# Patient Record
Sex: Female | Born: 1975 | Race: White | Hispanic: No | Marital: Married | Smoking: Never smoker
Health system: Southern US, Community
[De-identification: ages and names within clinical notes are randomized; demographics above are authoritative.]

## PROBLEM LIST (undated history)

## (undated) DIAGNOSIS — M2031 Hallux varus (acquired), right foot: Secondary | ICD-10-CM

## (undated) DIAGNOSIS — F32A Depression, unspecified: Secondary | ICD-10-CM

## (undated) DIAGNOSIS — I10 Essential (primary) hypertension: Secondary | ICD-10-CM

## (undated) DIAGNOSIS — M1990 Unspecified osteoarthritis, unspecified site: Secondary | ICD-10-CM

## (undated) HISTORY — PX: BREAST MASS EXCISION: SHX1267

---

## 2008-12-15 ENCOUNTER — Encounter: Admission: RE | Admit: 2008-12-15 | Discharge: 2008-12-15 | Payer: Self-pay | Admitting: Family Medicine

## 2008-12-15 IMAGING — CT CT MAXILLOFACIAL W/O CM
3 series · 16 of 40 positions shown, 19 images · non-contrast
Comparison: None available.

CLINICAL DATA: Recurrent sinusitis.

CT MAXILLOFACIAL WITHOUT CONTRAST
TECHNIQUE: Multidetector CT imaging of the maxillofacial
structures was performed. Multiplanar CT image reconstructions were
also generated.

[Series 4: 2.5 axial soft · axial · 0.35mm/px · z∈[-551,-523]mm · 3 of 48 slices shown]
[im 6/48  brain]
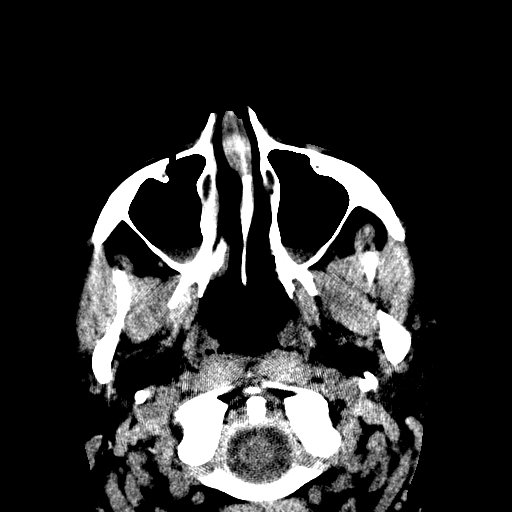
[im 12/48  brain]
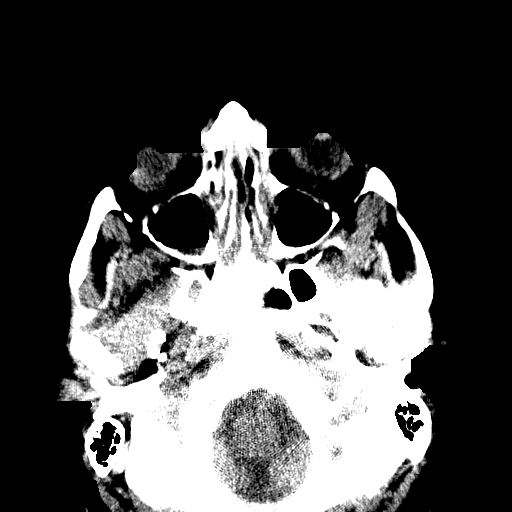
[im 17/48  brain]
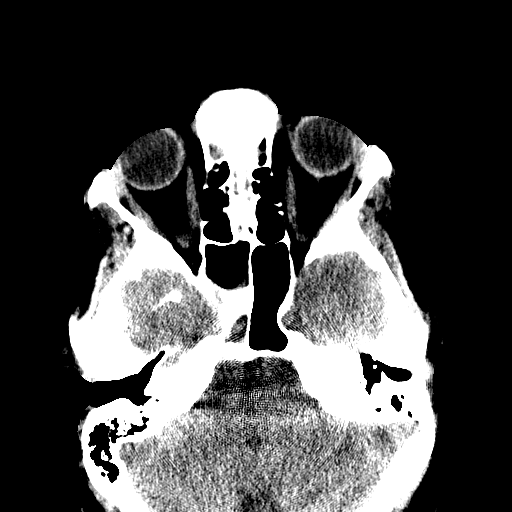

[coronals · coronal · 0.35mm/px · 3 of 38 slices shown]
[im 13/38  bone]
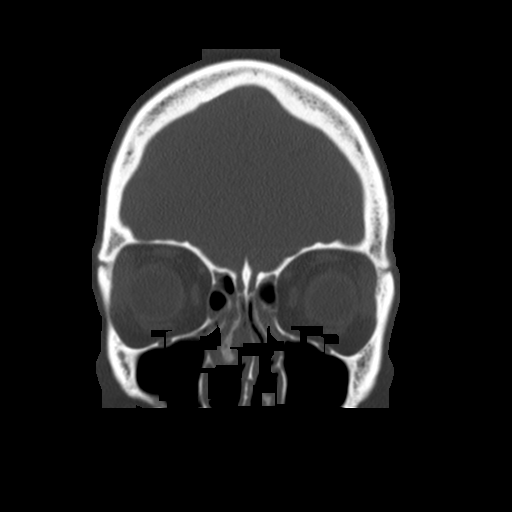
[im 17/38  bone]
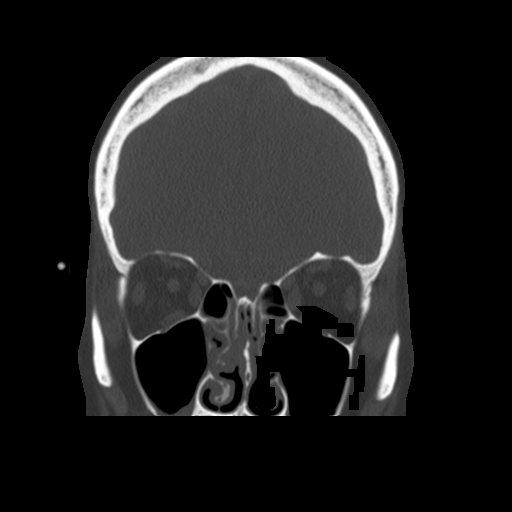
[im 21/38  bone]
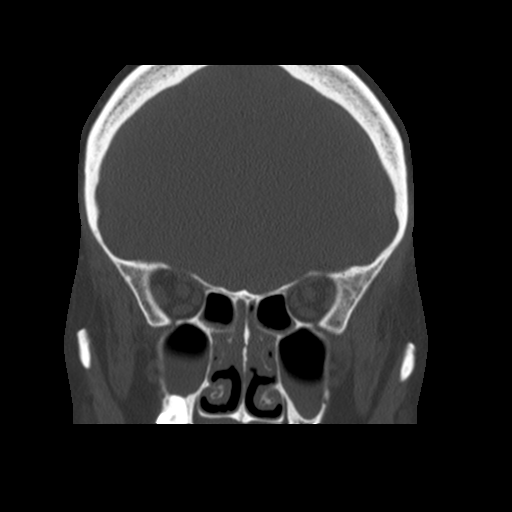

[ang.axials · axial · 0.35mm/px · z∈[-575,-510]mm · 10 of 33 slices shown, 13 images]
[im 3/33  brain]
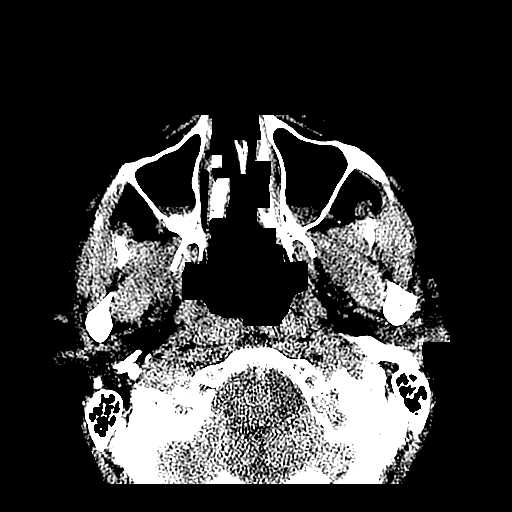
[im 3/33  bone]
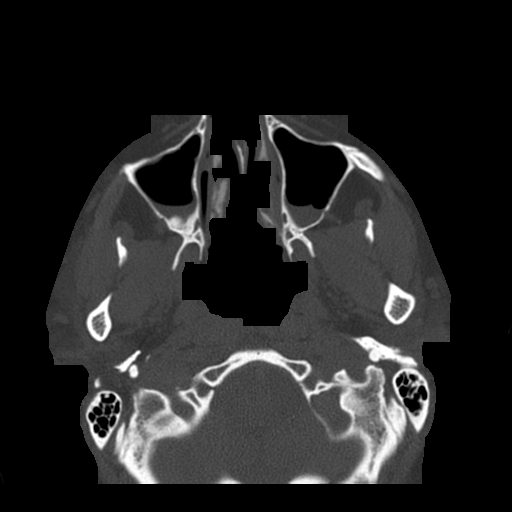
[im 6/33  bone]
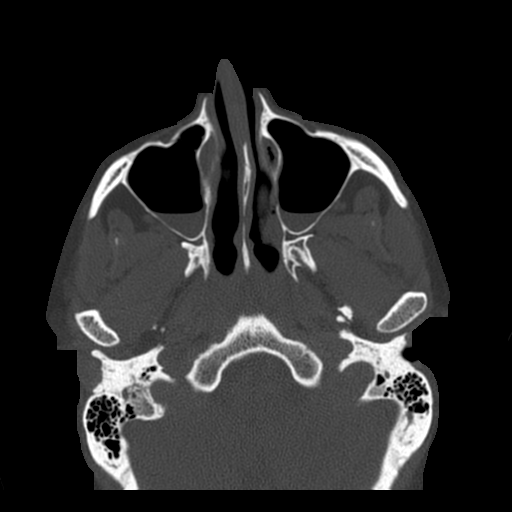
[im 9/33  bone]
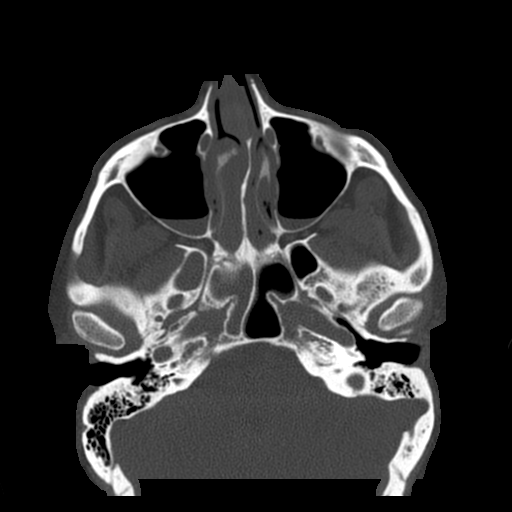
[im 12/33  bone]
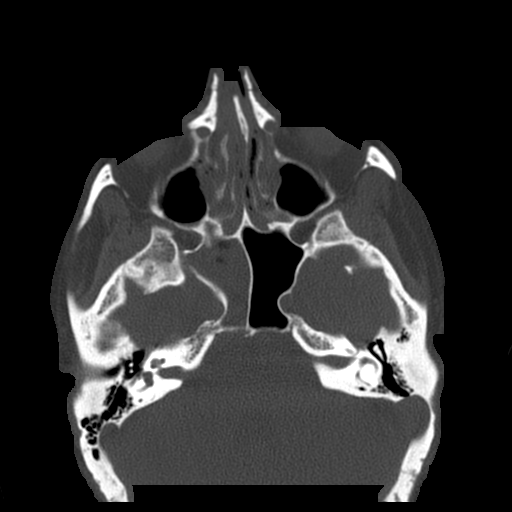
[im 15/33  brain]
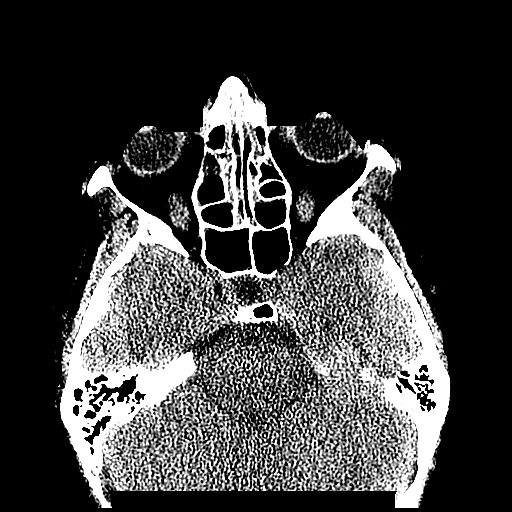
[im 15/33  bone]
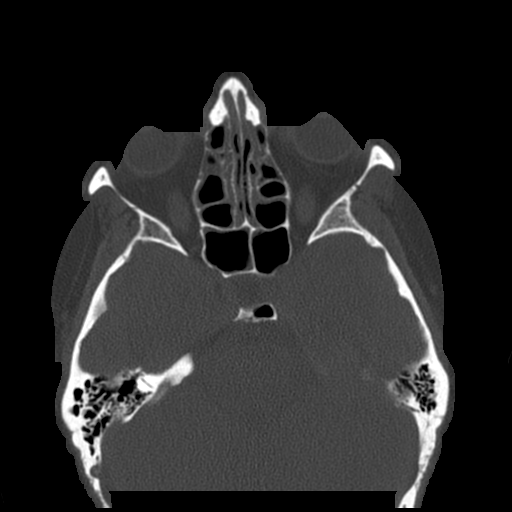
[im 18/33  bone]
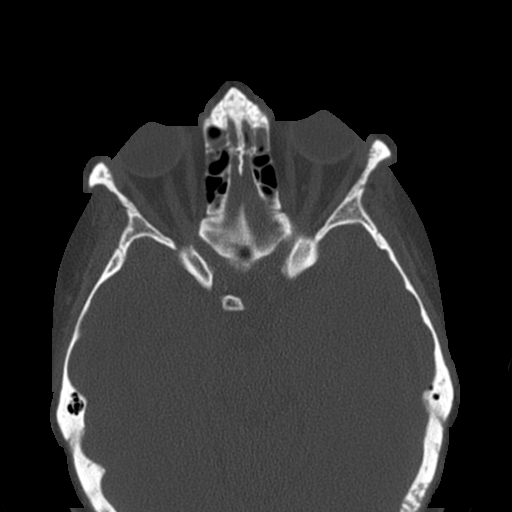
[im 21/33  bone]
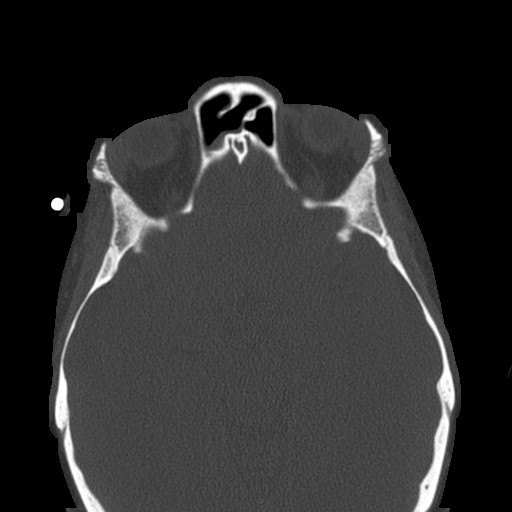
[im 24/33  bone]
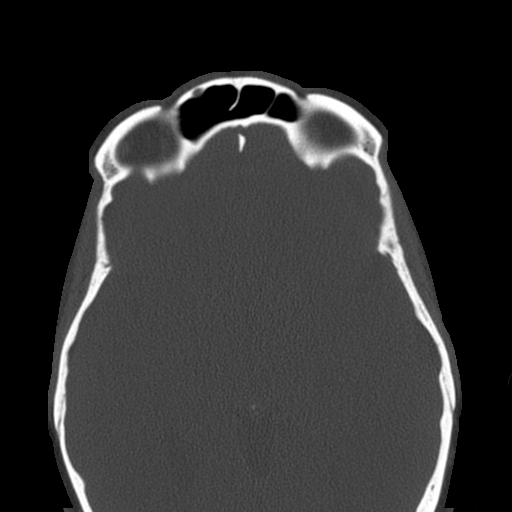
[im 27/33  brain]
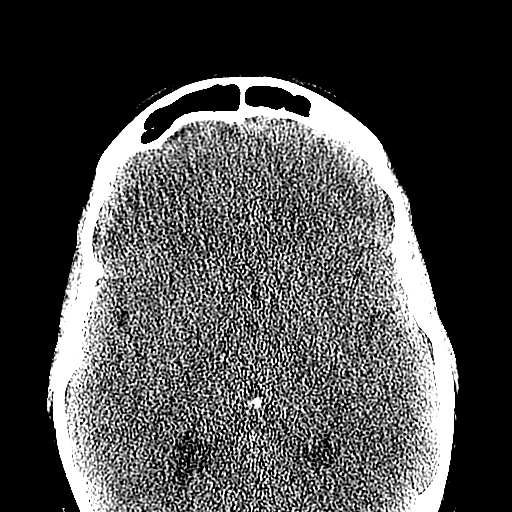
[im 27/33  bone]
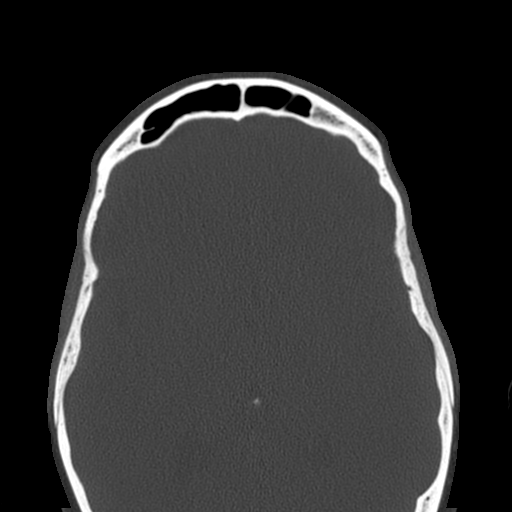
[im 30/33  bone]
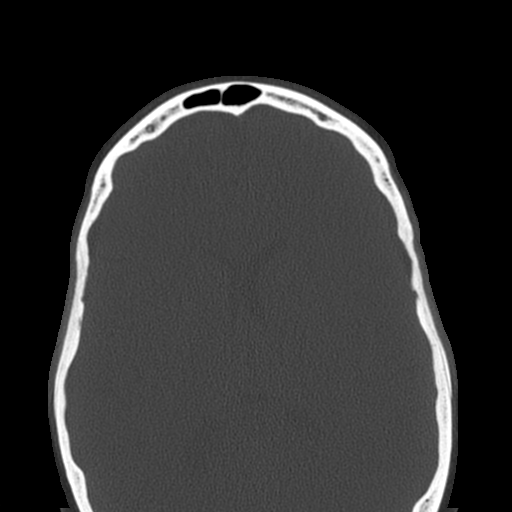

[16 of 40 positions shown; findings below may reference images not displayed]

FINDINGS: Air-fluid levels are present within the maxillary sinuses
bilaterally.  No significant mucosal disease is present.  There is
near total opacification of the right sphenoid sinus with frothy
secretions.  Mucosal thickening is evident throughout the ethmoid
air cells bilaterally at the the frontal sinuses are clear.  The
mastoid air cells are clear.  The osseous skull is intact.  The
soft tissues are unremarkable.  Limited imaging of the brain is
within normal limits.  There is mucosal thickening within the
ostiomeatal complex bilaterally without obstruction.  Slight
leftward nasal septal deviation is evident.
IMPRESSION: 1.  Acute and chronic sinus disease involving the maxillary sinuses
bilaterally, the ethmoid air cells bilaterally, and the right
sphenoid sinus.  No significant bone changes are evident.
2.  Mucosal thickening within the ostiomeatal complex bilaterally
without identification of the obstructing lesion.

## 2010-09-10 ENCOUNTER — Encounter: Payer: Self-pay | Admitting: Family Medicine

## 2019-06-22 ENCOUNTER — Other Ambulatory Visit: Payer: Self-pay

## 2019-06-22 DIAGNOSIS — Z20822 Contact with and (suspected) exposure to covid-19: Secondary | ICD-10-CM

## 2019-06-23 LAB — NOVEL CORONAVIRUS, NAA: SARS-CoV-2, NAA: NOT DETECTED

## 2020-10-19 ENCOUNTER — Telehealth: Payer: Self-pay

## 2020-10-19 ENCOUNTER — Other Ambulatory Visit: Payer: Self-pay

## 2020-10-19 ENCOUNTER — Ambulatory Visit: Payer: BC Managed Care – PPO | Admitting: Orthopedic Surgery

## 2020-10-19 ENCOUNTER — Ambulatory Visit: Payer: Self-pay

## 2020-10-19 ENCOUNTER — Encounter: Payer: Self-pay | Admitting: Orthopedic Surgery

## 2020-10-19 VITALS — Ht 73.0 in | Wt 237.0 lb

## 2020-10-19 DIAGNOSIS — M25561 Pain in right knee: Secondary | ICD-10-CM

## 2020-10-19 DIAGNOSIS — M1711 Unilateral primary osteoarthritis, right knee: Secondary | ICD-10-CM

## 2020-10-19 MED ORDER — BUPIVACAINE HCL 0.25 % IJ SOLN
4.0000 mL | INTRAMUSCULAR | Status: AC | PRN
  Administered 2020-10-19: 4 mL via INTRA_ARTICULAR

## 2020-10-19 MED ORDER — LIDOCAINE HCL 1 % IJ SOLN
5.0000 mL | INTRAMUSCULAR | Status: AC | PRN
  Administered 2020-10-19: 5 mL

## 2020-10-19 MED ORDER — METHYLPREDNISOLONE ACETATE 40 MG/ML IJ SUSP
40.0000 mg | INTRAMUSCULAR | Status: AC | PRN
  Administered 2020-10-19: 40 mg via INTRA_ARTICULAR

## 2020-10-19 NOTE — Telephone Encounter (Signed)
Noted  

## 2020-10-19 NOTE — Telephone Encounter (Signed)
Can we get patient approved for right knee gel injection? Thanks.  

## 2020-10-19 NOTE — Progress Notes (Signed)
Office Visit Note   Patient: Linda Gill           Date of Birth: November 29, 1975           MRN: [id number] Visit Date: 10/19/2020 Requested by: No referring provider defined for this encounter. PCP: Tracey Harries, MD  Subjective: Chief Complaint  Patient presents with  . Right Knee - Pain    HPI: Linda Gill is a 45 year old patient with right knee pain.  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  Like to do running lifting and CrossFit but she has not been able to do that for several years due to recent diagnosis of rheumatoid arthritis.  She has tried several medications but it is not working great to control her joint pain.  Reports multiple joint complaints but her primary issue which is keeping her from being able to obtain a higher level of fitness is her right knee.  Takes Mobic daily which helps the most.  No prior injections.  She works as a Pension scheme manager.  She does feel a little bit of catching laterally.              ROS: All systems reviewed are negative as they relate to the chief complaint within the history of present illness.  Patient denies  fevers or chills.   Assessment & Plan: Visit Diagnoses:  1. Right knee pain, unspecified chronicity     Plan: Impression is right knee synovitis with early arthritis on plain radiographs.  Interested in diminishing pain to achieve a higher level of activity.  Discussed optimal exercise methods.  I think cortisone injection indicated today with return office visit in about 6 weeks to assess efficacy and potentially consider gel injection.  Nonweightbearing quad strengthening exercises encouraged.  Follow-up as needed for gel injection which I think will likely be in around 6 to 8 weeks.  Marland Kitchen.This patient is diagnosed with osteoarthritis of the knee(s).    Radiographs show evidence of joint space narrowing, osteophytes, subchondral sclerosis and/or subchondral cysts.  This patient has knee pain which interferes with  functional and activities of daily living.    This patient has experienced inadequate response, adverse effects and/or intolerance with conservative treatments such as acetaminophen, NSAIDS, topical creams, physical therapy or regular exercise, knee bracing and/or weight loss.   This patient has experienced inadequate response or has a contraindication to intra articular steroid injections for at least 3 months.   This patient is not scheduled to have a total knee replacement within 6 months of starting treatment with viscosupplementation.   Follow-Up Instructions: Return in about 6 weeks (around 11/30/2020).   Orders:  Orders Placed This Encounter  Procedures  . XR KNEE 3 VIEW RIGHT   No orders of the defined types were placed in this encounter.     Procedures: Large Joint Inj: R knee on 10/19/2020 7:44 PM Indications: diagnostic evaluation, joint swelling and pain Details: 18 G 1.5 in needle, superolateral approach  Arthrogram: No  Medications: 5 mL lidocaine 1 %; 40 mg methylPREDNISolone acetate 40 MG/ML; 4 mL bupivacaine 0.25 % Outcome: tolerated well, no immediate complications Procedure, treatment alternatives, risks and benefits explained, specific risks discussed. Consent was given by the patient. Immediately prior to procedure a time out was called to verify the correct patient, procedure, equipment, support staff and site/side marked as required. Patient was prepped and draped in the usual sterile fashion.       Clinical Data: No additional findings.  Objective:  Vital Signs: Ht 6\' 1"  (1.854 m)   Wt 237 lb (107.5 kg)   BMI 31.27 kg/m   Physical Exam:   Constitutional: Patient appears well-developed HEENT:  Head: Normocephalic Eyes:EOM are normal Neck: Normal range of motion Cardiovascular: Normal rate Pulmonary/chest: Effort normal Neurologic: Patient is alert Skin: Skin is warm Psychiatric: Patient has normal mood and affect    Ortho Exam: Ortho exam  demonstrates full active and passive range of motion of the right knee.  Some synovitis and warmth is present right knee versus left.  Collateral and cruciate ligaments are stable.  Extensor mechanism is intact.  No masses lymphadenopathy or skin changes noted in that right knee region.  Range of motion is full.  Pedal pulses palpable.  She has medial and lateral joint line tenderness with significant patellofemoral crepitus more on the right than the left.  Specialty Comments:  No specialty comments available.  Imaging: XR KNEE 3 VIEW RIGHT  Result Date: 10/19/2020 AP lateral merchant right knee reviewed.  Osteophyte formation is present on the medial side and to a lesser degree lateral side.  Patellofemoral joint space narrowing also noted.  Patella is centered within the trochlear groove.  No acute fracture.    PMFS History: There are no problems to display for this patient.  History reviewed. No pertinent past medical history.  History reviewed. No pertinent family history.  History reviewed. No pertinent surgical history. Social History   Occupational History  . Not on file  Tobacco Use  . Smoking status: Not on file  . Smokeless tobacco: Not on file  Substance and Sexual Activity  . Alcohol use: Not on file  . Drug use: Not on file  . Sexual activity: Not on file

## 2020-10-21 ENCOUNTER — Telehealth: Payer: Self-pay

## 2020-10-21 NOTE — Telephone Encounter (Signed)
Submitted VOB for SynviscOne, right knee. Pending BV. 

## 2020-11-16 ENCOUNTER — Telehealth: Payer: Self-pay

## 2020-11-16 NOTE — Telephone Encounter (Signed)
PA required for SynviscOne, right knee. Faxed completed PA form to [employer] at [phone]. 

## 2020-11-21 ENCOUNTER — Telehealth: Payer: Self-pay

## 2020-11-21 NOTE — Telephone Encounter (Signed)
Called and left a Vm advising patient to call back to schedule an appointment for gel injection with Dr. August Saucer.  Approved for SynviscOne, bilateral knee. Buy & Bill Covered at 100% after Co-pay Co-pay of $150.00 is required PA Approval# [id number] Valid 11/16/2020- 05/14/2021

## 2020-12-21 ENCOUNTER — Other Ambulatory Visit: Payer: Self-pay

## 2020-12-21 ENCOUNTER — Encounter: Payer: Self-pay | Admitting: Orthopedic Surgery

## 2020-12-21 ENCOUNTER — Ambulatory Visit: Payer: BC Managed Care – PPO | Admitting: Orthopedic Surgery

## 2020-12-21 DIAGNOSIS — M1711 Unilateral primary osteoarthritis, right knee: Secondary | ICD-10-CM | POA: Diagnosis not present

## 2020-12-21 MED ORDER — LIDOCAINE HCL 1 % IJ SOLN
5.0000 mL | INTRAMUSCULAR | Status: AC | PRN
  Administered 2020-12-21: 5 mL

## 2020-12-21 MED ORDER — HYLAN G-F 20 48 MG/6ML IX SOSY
48.0000 mg | PREFILLED_SYRINGE | INTRA_ARTICULAR | Status: AC | PRN
  Administered 2020-12-21: 48 mg via INTRA_ARTICULAR

## 2020-12-21 NOTE — Progress Notes (Signed)
   Procedure Note  Patient: Linda Gill             Date of Birth: [birth year]           MRN: [id number]             Visit Date: 12/21/2020  Procedures: Visit Diagnoses:  1. Arthritis of right knee     Large Joint Inj on 12/21/2020 7:38 PM Indications: pain, joint swelling and diagnostic evaluation Details: 18 G 1.5 in needle, superolateral approach  Arthrogram: No  Medications: 5 mL lidocaine 1 %; 48 mg Hylan 48 MG/6ML Outcome: tolerated well, no immediate complications Procedure, treatment alternatives, risks and benefits explained, specific risks discussed. Consent was given by the patient. Immediately prior to procedure a time out was called to verify the correct patient, procedure, equipment, support staff and site/side marked as required. Patient was prepped and draped in the usual sterile fashion.     Patient also reports left groin pain.  She is can come back for evaluation for that on another day.  She will need AP pelvis and lateral left hip on return visit

## 2021-02-02 ENCOUNTER — Encounter: Payer: Self-pay | Admitting: Orthopedic Surgery

## 2021-02-02 ENCOUNTER — Ambulatory Visit: Payer: BC Managed Care – PPO | Admitting: Orthopedic Surgery

## 2021-02-02 ENCOUNTER — Ambulatory Visit: Payer: Self-pay

## 2021-02-02 ENCOUNTER — Other Ambulatory Visit: Payer: Self-pay

## 2021-02-02 DIAGNOSIS — M25552 Pain in left hip: Secondary | ICD-10-CM | POA: Diagnosis not present

## 2021-02-02 DIAGNOSIS — M79605 Pain in left leg: Secondary | ICD-10-CM | POA: Diagnosis not present

## 2021-02-02 NOTE — Progress Notes (Signed)
Office Visit Note   Patient: Linda Gill           Date of Birth: 1976-07-31           MRN: [id number] Visit Date: 02/02/2021 Requested by: Tracey Harries, MD [address] [place],  [place] [address] PCP: Tracey Harries, MD  Subjective: Chief Complaint  Patient presents with   Left Hip - Pain    HPI: Linda Gill is a 45 year old patient with right knee pain left hip pain.  She had gel injection several months ago which really did not help her too much in the right knee.  She also reports left hip and groin pain with radiation down below the knee to the mid calf region.  Does not report much in way of back pain.  She has a known history of rheumatoid arthritis.  She has received 2 injections which have not helped too much yet.  Third injection scheduled for July 7.  She has small spur on the medial side of the tibial plateau on right knee radiographs.  Not too much in the way of mechanical symptoms in the knee.  She also feels like the left hip is "full".              ROS: All systems reviewed are negative as they relate to the chief complaint within the history of present illness.  Patient denies  fevers or chills.   Assessment & Plan: Visit Diagnoses:  1. Pain in left hip   2. Pain in left leg     Plan: Impression is right knee very mild spurring with no effusion today but there is some warmth to the joint.  Real question with the right knee is whether or not there is anything structurally treatable with arthroscopy that could be performed to help her pain.  MRI scan right knee pending due to failure of conservative treatment as well as rehabilitation and therapeutic exercises for at least 6 weeks.  Regarding the left hip her exam is underwhelming.  She does have a slight Trendelenburg gait.  The Trendelenburg gait argues for possible synovitis in the hip although radiographs do not show any osteoarthritis.  Her back radiographs are more suggestive that this could be  radicular in nature.  Plan is MRI scan lumbar spine with diagnostic and therapeutic left hip injection.  We will see her back after both the scans and the injection.  Follow-Up Instructions: Return for after MRI.   Orders:  Orders Placed This Encounter  Procedures   XR HIP UNILAT W OR W/O PELVIS 2-3 VIEWS LEFT   XR Lumbar Spine 2-3 Views   MR Lumbar Spine w/o contrast   MR Knee Right w/o contrast   Ambulatory referral to Physical Medicine Rehab   No orders of the defined types were placed in this encounter.     Procedures: No procedures performed   Clinical Data: No additional findings.  Objective: Vital Signs: There were no vitals taken for this visit.  Physical Exam:   Constitutional: Patient appears well-developed HEENT:  Head: Normocephalic Eyes:EOM are normal Neck: Normal range of motion Cardiovascular: Normal rate Pulmonary/chest: Effort normal Neurologic: Patient is alert Skin: Skin is warm Psychiatric: Patient has normal mood and affect   Ortho Exam: Ortho exam demonstrates no real groin pain with internal ex rotation of either leg.  Has good hip abduction adduction and flexion strength.  No nerve root tension signs.  No paresthesias L1 S1 bilaterally.  Mild pain with  forward lateral bending but no trochanteric tenderness is present.  Right knee has no effusion but some warmth.  Range of motion is full with stable collateral and cruciate ligaments.  Her right knee does come out into full extension but her left knee hyperextends about 5 degrees so functionally she has lost about 5 degrees of full extension compared to the left-hand side.  Specialty Comments:  No specialty comments available.  Imaging: XR HIP UNILAT W OR W/O PELVIS 2-3 VIEWS LEFT  Result Date: 02/02/2021 AP pelvis lateral radiographs left hip reviewed.  Hip joint without fracture or dislocation.  No significant degenerative changes present.  Remainder bony pelvis unremarkable.  Increased density  noted within the bilateral L4 and L5 facet joints and L5-S1 facet joints.  XR Lumbar Spine 2-3 Views  Result Date: 02/02/2021 AP lateral radiographs lumbar spine reviewed.  Facet arthritis is present along with significant degenerative disc disease at L4-5 and L5-S1.  No compression fractures.  No spondylolisthesis.  Remainder bony pelvis appears intact.    PMFS History: There are no problems to display for this patient.  History reviewed. No pertinent past medical history.  History reviewed. No pertinent family history.  History reviewed. No pertinent surgical history. Social History   Occupational History   Not on file  Tobacco Use   Smoking status: Not on file   Smokeless tobacco: Not on file  Substance and Sexual Activity   Alcohol use: Not on file   Drug use: Not on file   Sexual activity: Not on file

## 2021-02-08 ENCOUNTER — Other Ambulatory Visit: Payer: Self-pay

## 2021-02-08 ENCOUNTER — Ambulatory Visit (INDEPENDENT_AMBULATORY_CARE_PROVIDER_SITE_OTHER): Payer: BC Managed Care – PPO | Admitting: Physical Medicine and Rehabilitation

## 2021-02-08 ENCOUNTER — Encounter: Payer: Self-pay | Admitting: Physical Medicine and Rehabilitation

## 2021-02-08 ENCOUNTER — Ambulatory Visit: Payer: Self-pay

## 2021-02-08 DIAGNOSIS — M25552 Pain in left hip: Secondary | ICD-10-CM | POA: Diagnosis not present

## 2021-02-08 NOTE — Progress Notes (Signed)
Pt state left hip pain. Pt state walking, sitting, standing and laying down makes the pain worse. Pt state she use a pillow to lay with in between her legs. Pt state she takes pain meds to help ease her pain.   Numeric Pain Rating Scale and Functional Assessment Average Pain 5   In the last MONTH (on 0-10 scale) has pain interfered with the following?  1. General activity like being  able to carry out your everyday physical activities such as walking, climbing stairs, carrying groceries, or moving a chair?  Rating(8)   -BT, -Dye Allergies.

## 2021-02-08 NOTE — Progress Notes (Signed)
   Linda Gill - 45 y.o. female MRN [id number]  Date of birth: 02/24/1976  Office Visit Note: Visit Date: 02/08/2021 PCP: Tracey Harries, MD Referred by: Tracey Harries, MD  Subjective: Chief Complaint  Patient presents with   Left Hip - Pain   HPI:  Linda Gill is a 45 y.o. female who comes in today at the request of Dr. Burnard Bunting for planned Left anesthetic hip arthrogram with fluoroscopic guidance.  The patient has failed conservative care including home exercise, medications, time and activity modification.  This injection will be diagnostic and hopefully therapeutic.  Please see requesting physician notes for further details and justification.   ROS Otherwise per HPI.  Assessment & Plan: Visit Diagnoses:    ICD-10-CM   1. Pain in left hip  M25.552 XR C-ARM NO REPORT    Large Joint Inj: L hip joint      Plan: No additional findings.   Meds & Orders: No orders of the defined types were placed in this encounter.   Orders Placed This Encounter  Procedures   Large Joint Inj: L hip joint   XR C-ARM NO REPORT    Follow-up: No follow-ups on file.   Procedures: Large Joint Inj: L hip joint on 02/08/2021 3:17 PM Indications: diagnostic evaluation and pain Details: 22 G 3.5 in needle, fluoroscopy-guided anterior approach  Arthrogram: No  Medications: 4 mL bupivacaine 0.25 %; 60 mg triamcinolone acetonide 40 MG/ML Outcome: tolerated well, no immediate complications  There was excellent flow of contrast producing a partial arthrogram of the hip. The patient did have relief of symptoms during the anesthetic phase of the injection. Procedure, treatment alternatives, risks and benefits explained, specific risks discussed. Consent was given by the patient. Immediately prior to procedure a time out was called to verify the correct patient, procedure, equipment, support staff and site/side marked as required. Patient was prepped and draped in the usual sterile fashion.          Clinical History: No specialty comments available.     Objective:  VS:  HT:    WT:   BMI:     BP:   HR: bpm  TEMP: ( )  RESP:  Physical Exam   Imaging: No results found.

## 2021-02-12 MED ORDER — TRIAMCINOLONE ACETONIDE 40 MG/ML IJ SUSP
60.0000 mg | INTRAMUSCULAR | Status: AC | PRN
  Administered 2021-02-08: 60 mg via INTRA_ARTICULAR

## 2021-02-12 MED ORDER — BUPIVACAINE HCL 0.25 % IJ SOLN
4.0000 mL | INTRAMUSCULAR | Status: AC | PRN
  Administered 2021-02-08: 4 mL via INTRA_ARTICULAR

## 2021-02-18 ENCOUNTER — Ambulatory Visit (HOSPITAL_BASED_OUTPATIENT_CLINIC_OR_DEPARTMENT_OTHER): Payer: BC Managed Care – PPO

## 2021-02-23 ENCOUNTER — Telehealth: Payer: Self-pay | Admitting: Orthopedic Surgery

## 2021-02-23 ENCOUNTER — Other Ambulatory Visit: Payer: Self-pay | Admitting: Orthopedic Surgery

## 2021-02-23 DIAGNOSIS — M25562 Pain in left knee: Secondary | ICD-10-CM

## 2021-02-23 NOTE — Telephone Encounter (Signed)
Order has been changed

## 2021-02-23 NOTE — Telephone Encounter (Signed)
Lawson Fiscal from [facility] called. Would like to know if the MRI is for the L hip or L knee. They got a referral for L knee but DX is L hip. Call back number is [phone]

## 2021-02-24 ENCOUNTER — Ambulatory Visit: Payer: BC Managed Care – PPO | Admitting: Orthopedic Surgery

## 2021-02-25 ENCOUNTER — Other Ambulatory Visit: Payer: Self-pay

## 2021-02-25 ENCOUNTER — Ambulatory Visit (HOSPITAL_BASED_OUTPATIENT_CLINIC_OR_DEPARTMENT_OTHER)
Admission: RE | Admit: 2021-02-25 | Discharge: 2021-02-25 | Disposition: A | Discharge status: Home / Self Care | Payer: BC Managed Care – PPO | Source: Ambulatory Visit | Attending: Orthopedic Surgery | Admitting: Orthopedic Surgery

## 2021-02-25 ENCOUNTER — Other Ambulatory Visit: Payer: Self-pay | Admitting: Orthopedic Surgery

## 2021-02-25 DIAGNOSIS — M25562 Pain in left knee: Secondary | ICD-10-CM | POA: Diagnosis present

## 2021-02-25 DIAGNOSIS — M79605 Pain in left leg: Secondary | ICD-10-CM | POA: Insufficient documentation

## 2021-02-25 IMAGING — MR MR KNEE*R* W/O CM
5 of 8 series · 26 of 40 positions shown · non-contrast
Comparison: None.

CLINICAL DATA: Right knee pain and stiffness.

EXAM:
MRI OF THE RIGHT KNEE WITHOUT CONTRAST
TECHNIQUE: Multiplanar, multisequence MR imaging of the knee was performed. No
intravenous contrast was administered.

[Series 3: T2 fat-sat · axial · 3.0mm · 0.31mm/px · z∈[-70,+48]mm · 6 of 34 slices shown (1 of 2)]
[im 1/34]
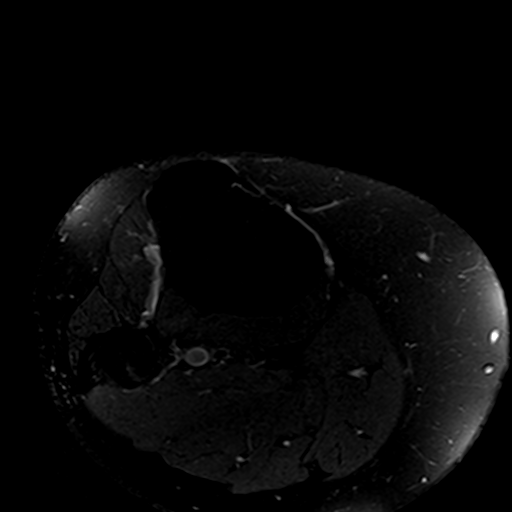
[im 7/34]
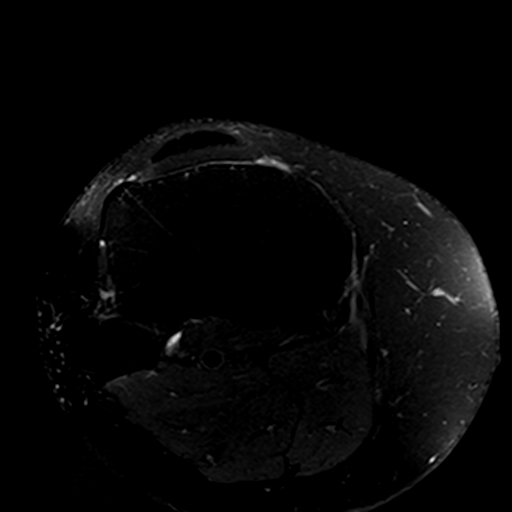
[im 14/34]
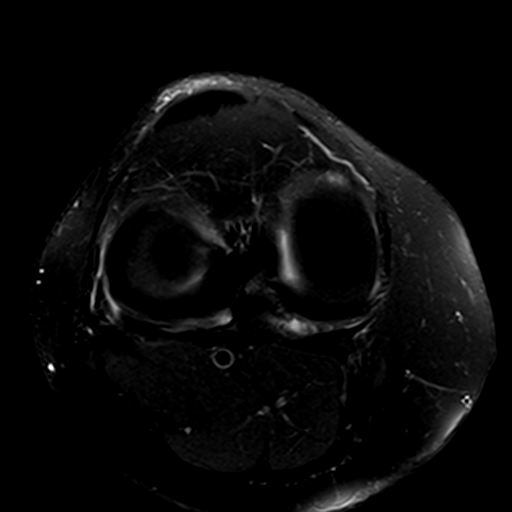
[im 20/34]
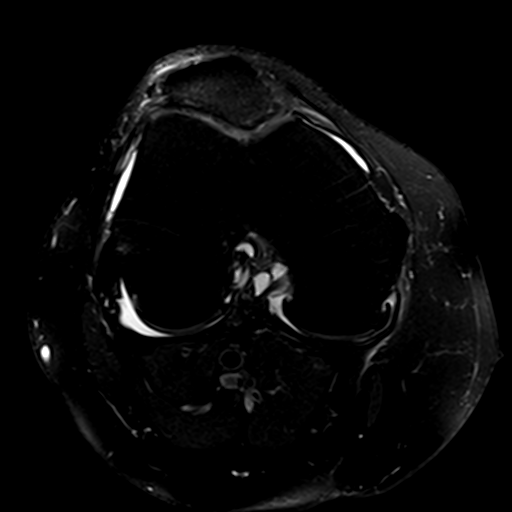
[im 27/34]
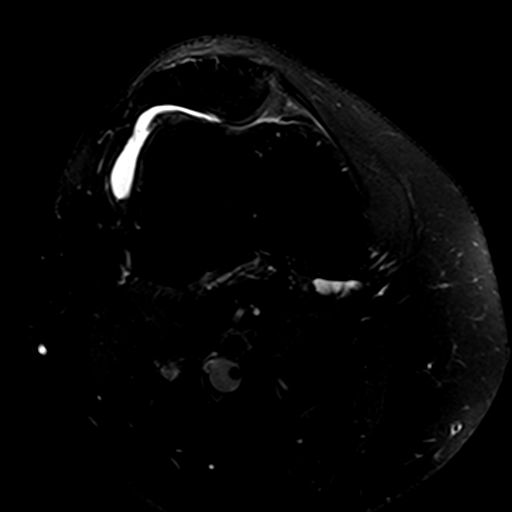
[im 34/34]
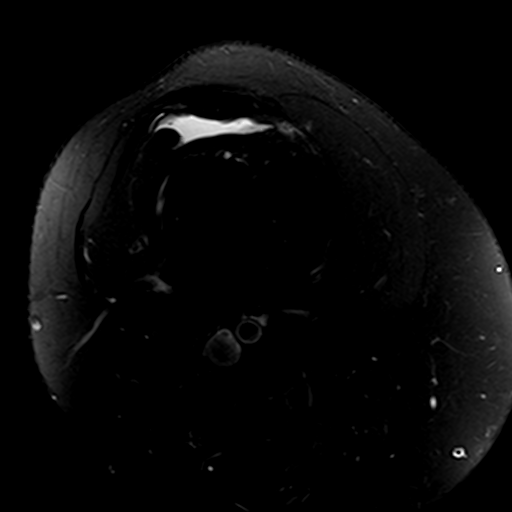

[Series 5: T2 fat-sat · coronal · 4.0mm · 0.29mm/px · 4 of 26 slices shown (2 of 2)]
[im 1/26]
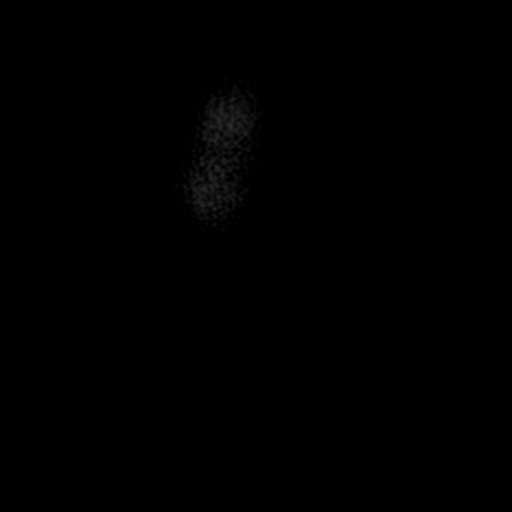
[im 7/26]
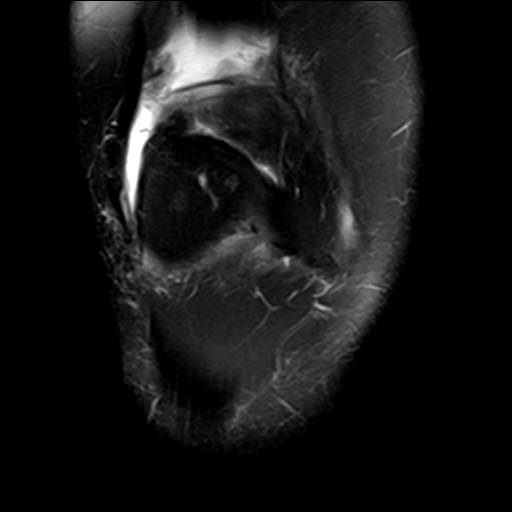
[im 13/26]
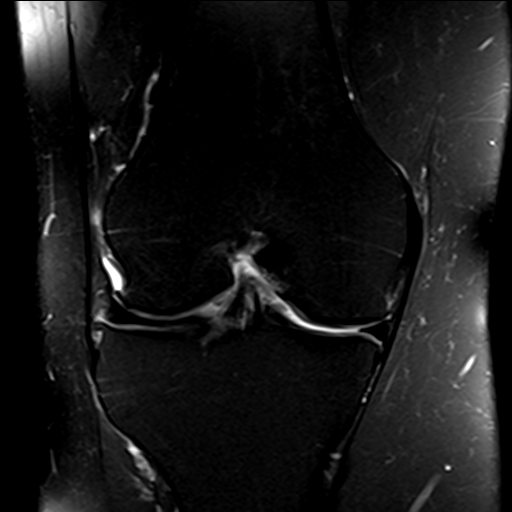
[im 19/26]
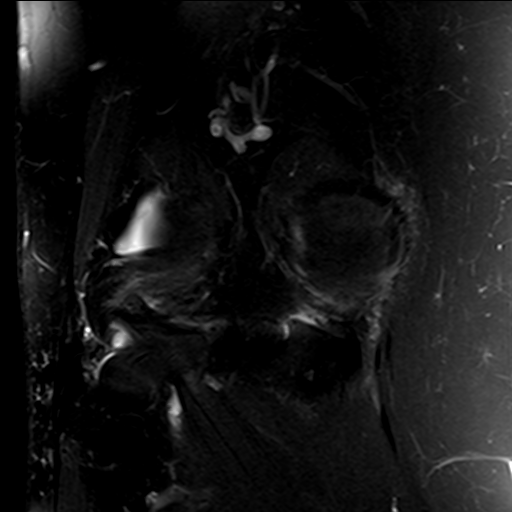

[Series 6: PD fat-sat · coronal · 4.0mm · 0.29mm/px · 5 of 26 slices shown (1 of 2)]
[im 1/26]
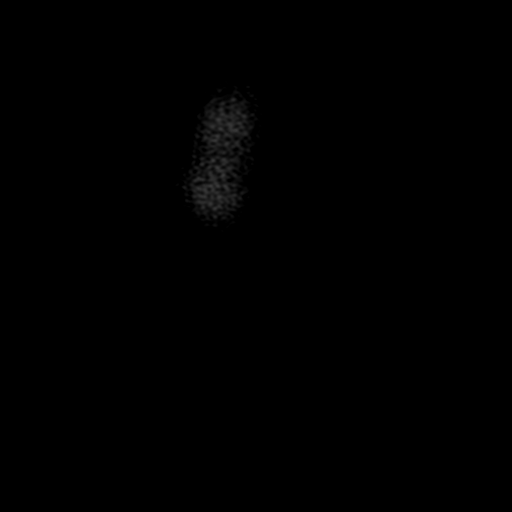
[im 7/26]
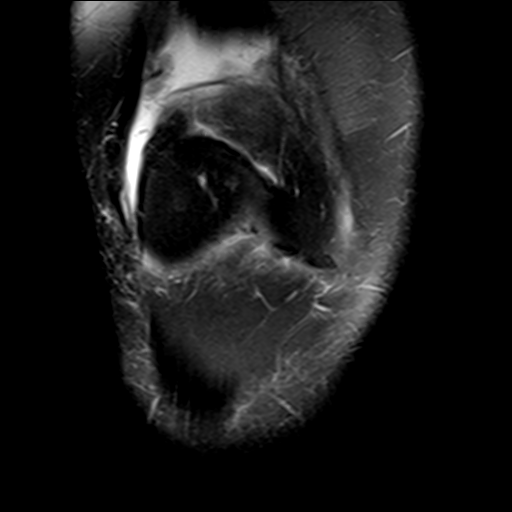
[im 13/26]
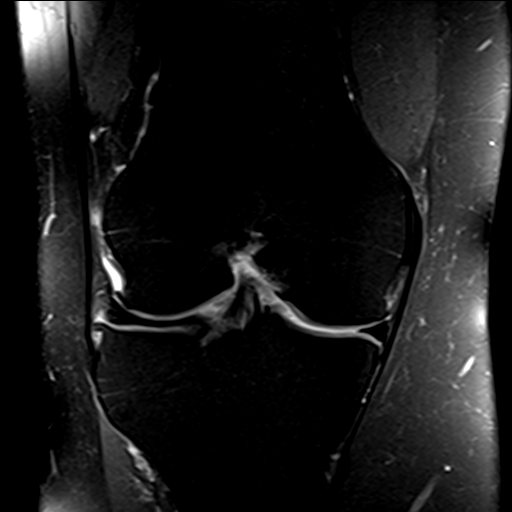
[im 19/26]
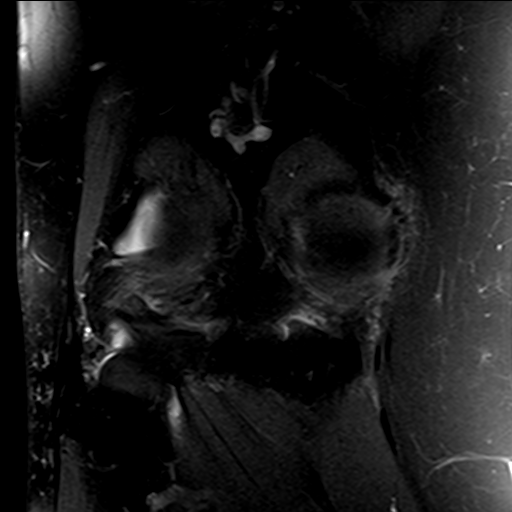
[im 26/26]
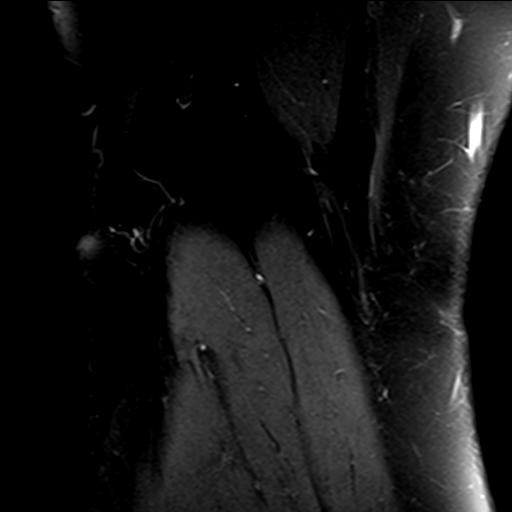

[Series 7: PD fat-sat · sagittal · 3.0mm · 0.59mm/px · 7 of 34 slices shown (2 of 2)]
[im 1/34]
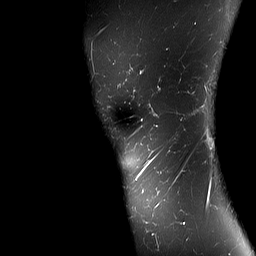
[im 6/34]
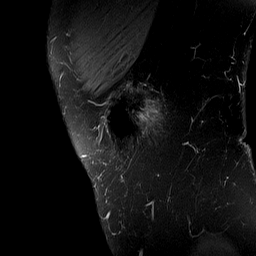
[im 12/34]
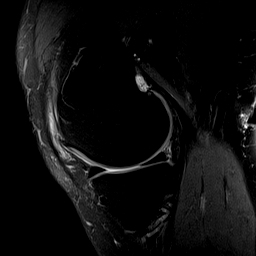
[im 17/34]
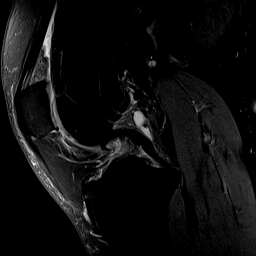
[im 23/34]
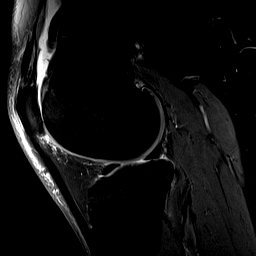
[im 28/34]
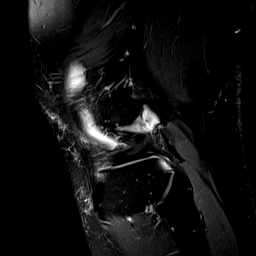
[im 34/34]
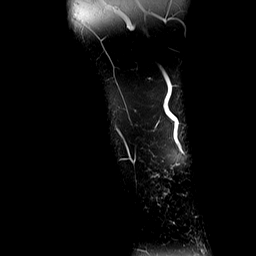

[Series 9: PD · coronal · 2.0mm · 0.47mm/px · 4 of 18 slices shown]
[im 1/18]
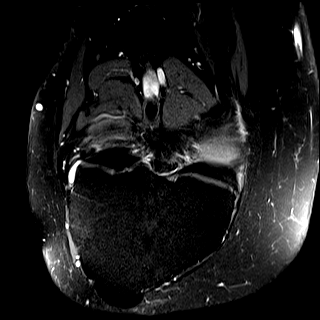
[im 6/18]
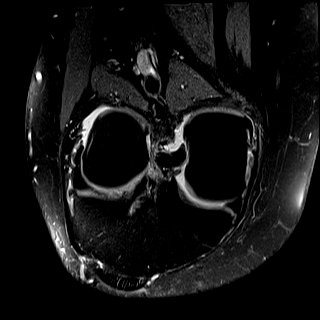
[im 12/18]
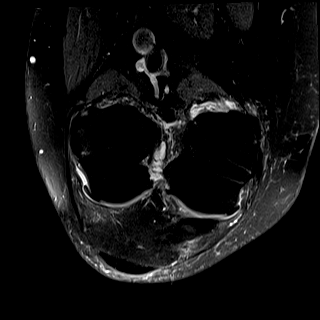
[im 18/18]
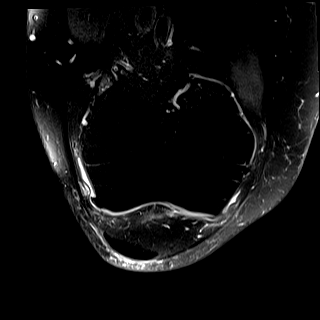

[26 of 40 positions shown; findings below may reference images not displayed]

FINDINGS: MENISCI

Medial: Intact.

Lateral: Intact.

LIGAMENTS

Cruciates: ACL and PCL are intact.

Collaterals: Medial collateral ligament is intact. Lateral
collateral ligament complex is intact.

CARTILAGE

Patellofemoral: Full-thickness cartilage loss of the lateral
patellofemoral compartment with subchondral reactive marrow changes.
Partial-thickness cartilage loss of the medial patellofemoral
compartment.

Medial: Mild partial-thickness cartilage loss of the medial
femorotibial compartment.

Lateral: Mild partial-thickness cartilage loss of the lateral
femorotibial compartment with marginal osteophytes.

JOINT: Large joint effusion. Mild edema in superolateral Hoffa's
fat. No plical thickening.

POPLITEAL FOSSA: Popliteus tendon is intact. No Baker's cyst.

EXTENSOR MECHANISM: Intact quadriceps tendon. Intact patellar
tendon. Intact lateral patellar retinaculum. Intact medial patellar
retinaculum. Intact MPFL.

BONES: No aggressive osseous lesion. No fracture or dislocation.

Other: No fluid collection or hematoma. Muscles are normal.
IMPRESSION: 1. No meniscal or ligamentous injury of the right knee.
2. Tricompartmental cartilage abnormalities of the right knee most
severe in the lateral patellofemoral compartment.

## 2021-02-25 IMAGING — MR MR LUMBAR SPINE W/O CM
4 of 5 series · 23 of 48 positions shown · non-contrast
Comparison: Radiograph from [DATE].

CLINICAL DATA: Initial evaluation for left greater than right
radicular leg pain, left hip pain with radiation down left leg.

EXAM:
MRI LUMBAR SPINE WITHOUT CONTRAST
TECHNIQUE: Multiplanar, multisequence MR imaging of the lumbar spine was
performed. No intravenous contrast was administered.

[Series 5: T2 · axial · 4.0mm · 0.39mm/px · z∈[-168,+41]mm · 11 of 50 slices shown (1 of 2)]
[im 4/50]
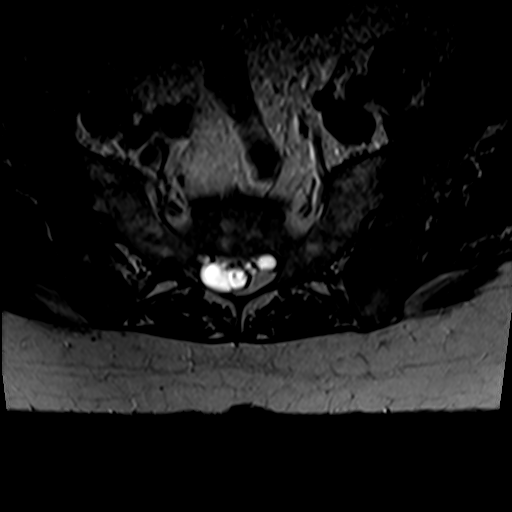
[im 7/50]
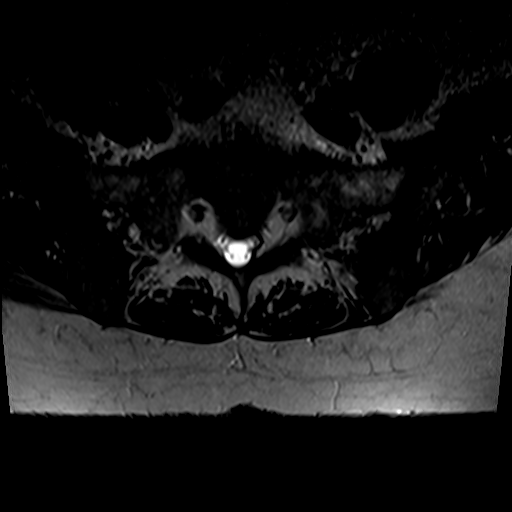
[im 10/50]
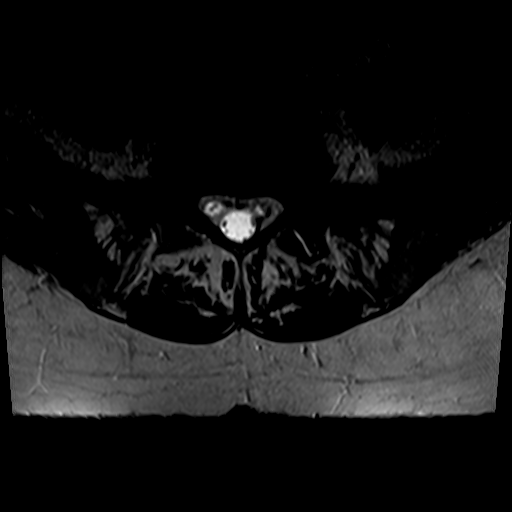
[im 16/50]
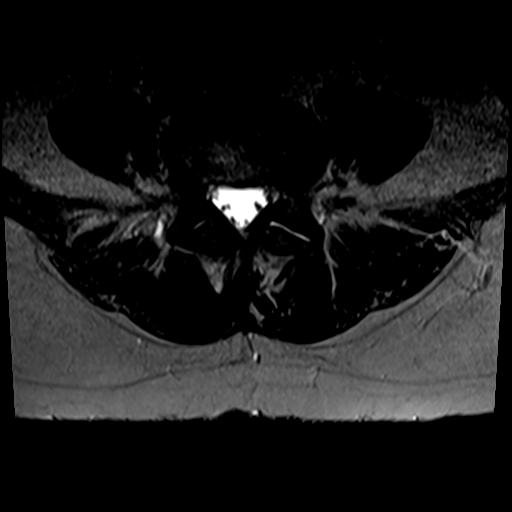
[im 22/50]
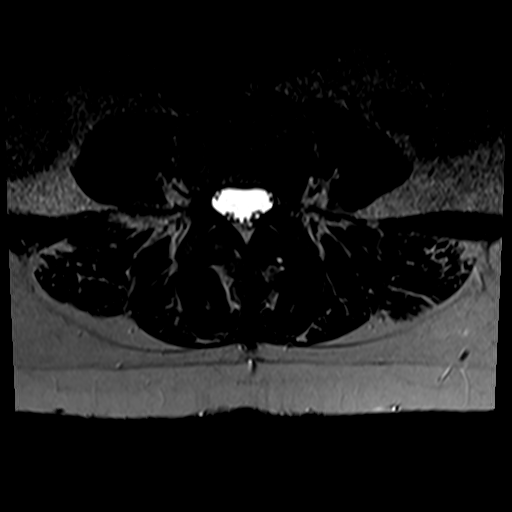
[im 25/50]
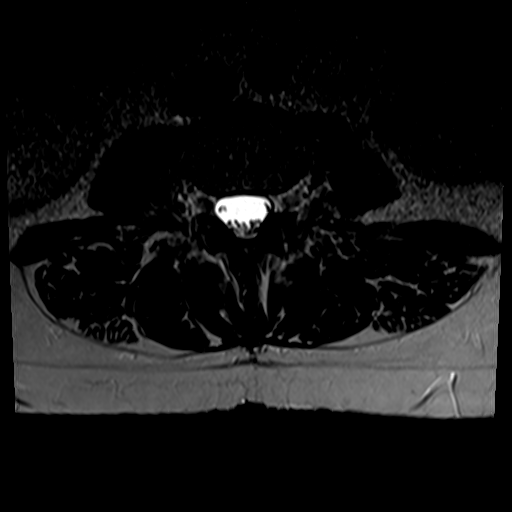
[im 28/50]
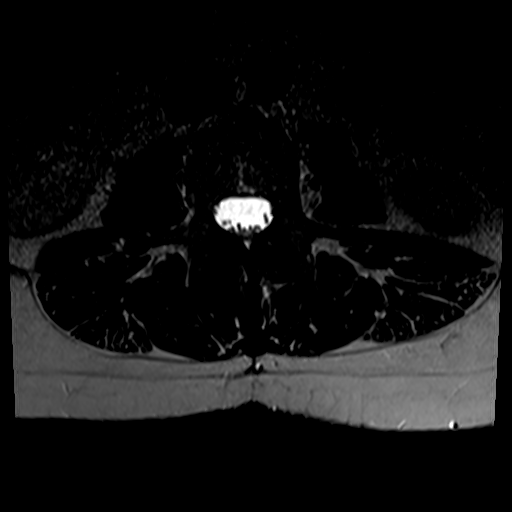
[im 34/50]
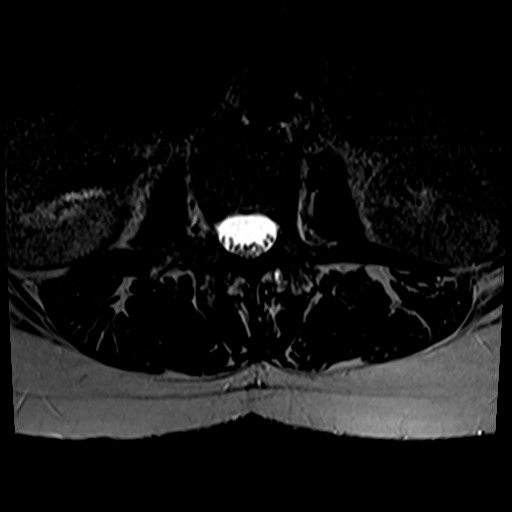
[im 40/50]
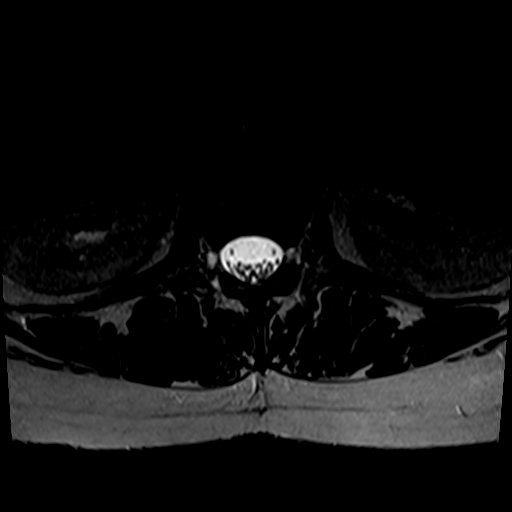
[im 43/50]
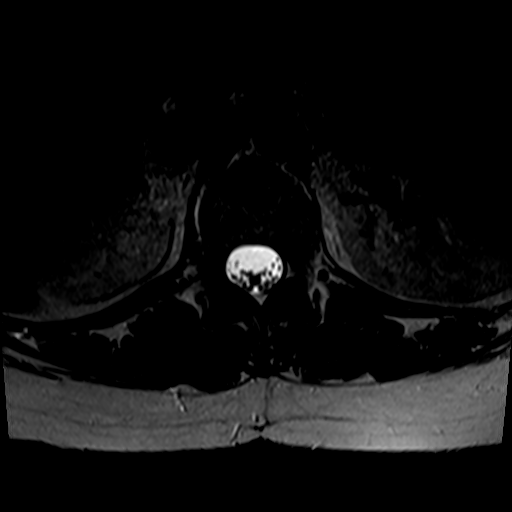
[im 46/50]
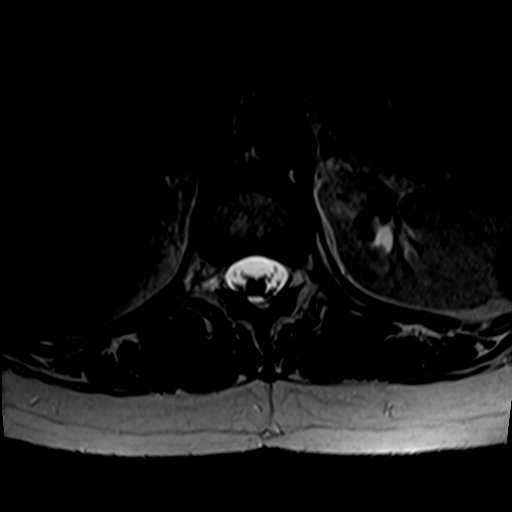

[Series 6: T1 · axial · 4.0mm · 0.39mm/px · z∈[-168,+26]mm · 4 of 50 slices shown (1 of 2)]
[im 4/50]
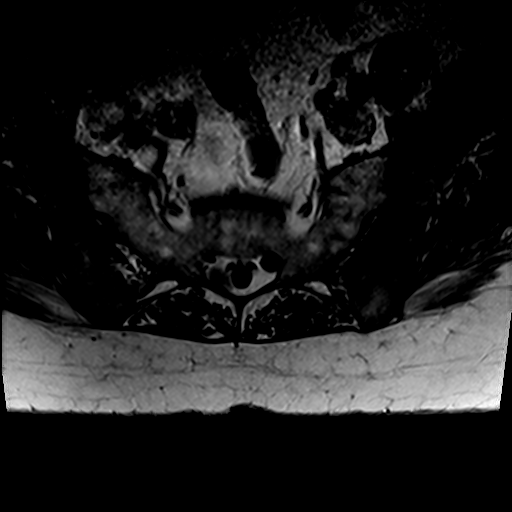
[im 7/50]
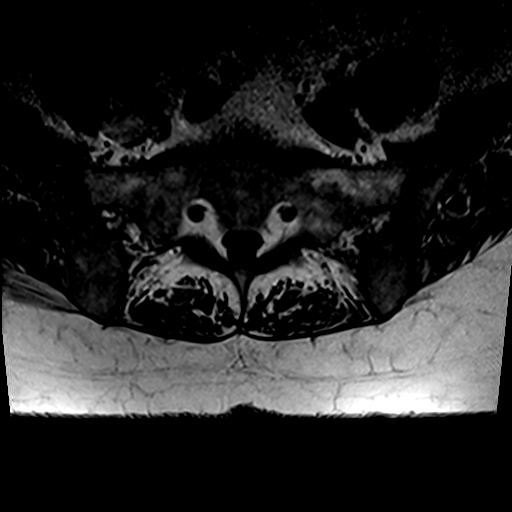
[im 25/50]
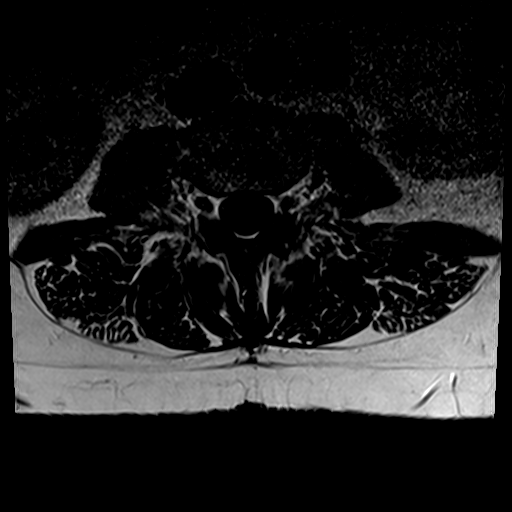
[im 43/50]
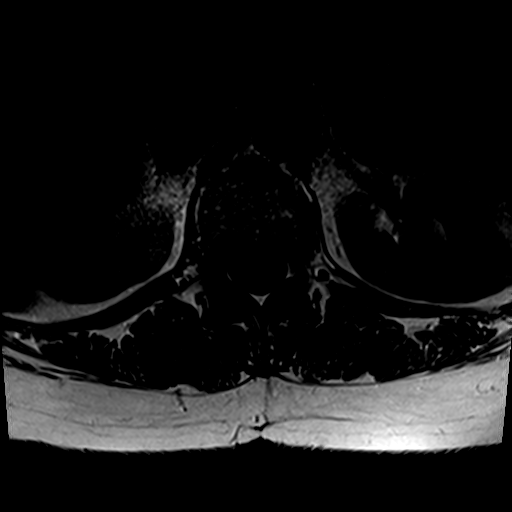

[Series 7: T2 · sagittal · 4.0mm · 1.05mm/px · 5 of 15 slices shown (2 of 2)]
[im 1/15]
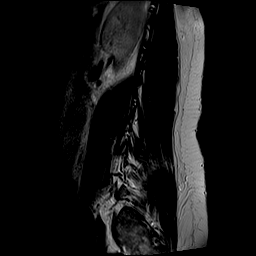
[im 4/15]
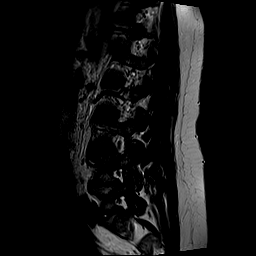
[im 8/15]
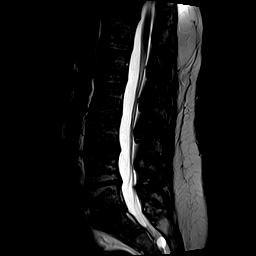
[im 11/15]
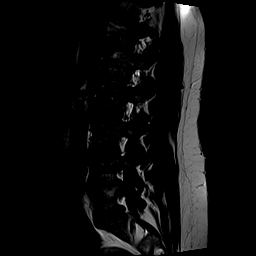
[im 15/15]
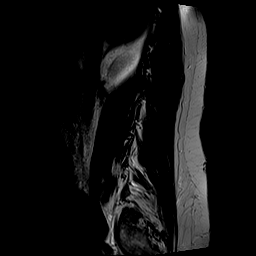

[Series 8: T1 · sagittal · 4.0mm · 1.05mm/px · 3 of 15 slices shown (2 of 2)]
[im 1/15]
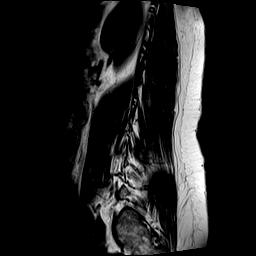
[im 8/15]
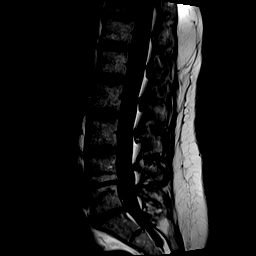
[im 15/15]
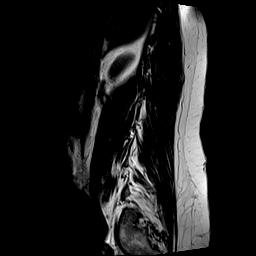

[23 of 48 positions shown; findings below may reference images not displayed]

FINDINGS: Segmentation: Standard. Lowest well-formed disc space labeled the
L5-S1 level.

Alignment: Physiologic with preservation of the normal lumbar
lordosis. No listhesis.

Vertebrae: Vertebral body height maintained without acute or chronic
fracture. Bone marrow signal intensity within normal limits.
Subcentimeter benign hemangioma noted within the L3 vertebral body.
No worrisome osseous lesions. Discogenic reactive endplate change
present about the L4-5 and L5-S1 interspaces. No other abnormal
marrow edema.

Conus medullaris and cauda equina: Conus extends to the T12-L1
level. Conus and cauda equina appear normal.

Paraspinal and other soft tissues: Paraspinous soft tissues within
normal limits. Visualized visceral structures grossly within normal
limits.

Disc levels:

T11-12: Seen only on sagittal projection. Mild disc bulge with disc
desiccation with reactive endplate spurring. No stenosis.

T12-L1: Unremarkable.

L1-2: Negative interspace. Minimal facet spurring. No canal or
foraminal stenosis.

L2-3: Negative interspace. Minimal facet hypertrophy. No canal or
foraminal stenosis.

L3-4: Mild disc bulge with disc desiccation. Mild facet hypertrophy.
No significant spinal stenosis. Foramina remain patent.

L4-5: Advanced degenerative intervertebral disc space narrowing with
disc desiccation and mild disc bulge. Associated discogenic reactive
endplate change with marginal endplate osteophytic spurring. Mild
bilateral facet hypertrophy with associated trace joint effusions.
No significant canal or lateral recess stenosis. Mild right greater
than left L4 foraminal narrowing. No frank impingement.

L5-S1: Degenerative intervertebral disc space narrowing with diffuse
disc bulge and disc desiccation. Associated reactive endplate change
with marginal endplate osteophytic spurring. Bulging disc mildly
indents the ventral thecal sac without frank neural impingement.
Mild facet hypertrophy. No canal or lateral recess stenosis.
Foramina remain patent.
IMPRESSION: 1. Advanced degenerative disc disease with mild facet hypertrophy at
L4-5 with resultant mild right greater than left L4 foraminal
stenosis.
2. Disc bulging with reactive endplate change and facet hypertrophy
at L5-S1 without significant stenosis or neural impingement.
3. Mild noncompressive disc bulging at L3-4 without stenosis or
impingement.
4. No other findings to explain patient's lower extremity radicular
symptoms identified.

## 2021-02-28 ENCOUNTER — Encounter: Payer: Self-pay | Admitting: Surgical

## 2021-02-28 ENCOUNTER — Ambulatory Visit: Payer: BC Managed Care – PPO | Admitting: Surgical

## 2021-02-28 DIAGNOSIS — M1711 Unilateral primary osteoarthritis, right knee: Secondary | ICD-10-CM | POA: Diagnosis not present

## 2021-02-28 DIAGNOSIS — M79605 Pain in left leg: Secondary | ICD-10-CM

## 2021-02-28 NOTE — Progress Notes (Signed)
Office Visit Note   Patient: Linda Gill           Date of Birth: 1976-04-01           MRN: [id number] Visit Date: 02/28/2021 Requested by: Tracey Harries, MD [address] [place],  [place] [address] PCP: Tracey Harries, MD  Subjective: Chief Complaint  Patient presents with   Other    Review MRI scans    HPI: Linda Gill is a 45 y.o. female who presents to the office complaining of left hip and back pain as well as right knee pain.  Patient returns following visit with Dr. August Saucer on 02/02/2021.  She was set up for left knee intra-articular injection by Dr. Alvester Morin that she had on 02/08/2021 and she states that this injection provided no relief for her hip pain, not even for several hours or a day.  She localizes most of her hip pain to the lateral aspect of the hip with some radiation down the lateral thigh to the knee and occasional radiation past the knee.  She also notes low back pain that is not really too bothersome for her but she really has just sort of excepted this as part of her life as she has had low back pain for long time but she always thought it was "muscular".  She also continues to complain of the same anterior right knee pain that she was seen by Dr. August Saucer for.  Denies any real pain over the medial or lateral joint lines and most of her pain bothers her with stairs and with ascending hills..                ROS: All systems reviewed are negative as they relate to the chief complaint within the history of present illness.  Patient denies fevers or chills.  Assessment & Plan: Visit Diagnoses:  1. Pain in left leg     Plan: Patient is a 45 year old female who returns today for reevaluation of left hip pain and right knee pain.  Left hip pain is mostly lateral with some radiation down the lateral thigh.  She had no relief whatsoever from left hip intra-articular injection.  She had MRI of the lumbar spine that was reviewed today.  She does have marrow  edema with degeneration of the intervertebral disc worst at L4-L5 and L5-S1.  There is mild foraminal narrowing at L4-L5 without any significant canal or lateral recess stenosis.  Marrow edema and disc degeneration likely contribute to the "muscular" back pain she has been experiencing and the mild foraminal stenosis may be contributing to her left hip pain.  Plan to refer patient to Dr. Alvester Morin for diagnostic and therapeutic lumbar spine ESI.  Also reviewed the MRI of the right knee with the patient today.  There were no meniscal injuries or ligamentous injuries noted and the medial and lateral compartments actually look pretty good with only mild cartilage loss throughout both compartments.  She is not symptomatic over the medial or lateral joint lines which goes along with this imaging.  However, she has severe full-thickness cartilage loss along the lateral patellar facet with marrow edema in the patella and the trochlea.  Impression is that most to all of her pain is stemming from the severe arthritis in this compartment.  She has tried cortisone injections and gel injections without any lasting relief.  She is also very active individual and has been doing exercises to try and help her knee but  with no avail.  With failure of conservative treatment, really the only 2 options are to live with the pain and receive occasional injections or have surgery which would likely be patellofemoral replacement given the fairly well-preserved medial and lateral compartments.  Discussed this with the patient and she would like to hold off on surgery for now but plan to discuss this further after she discusses with her husband and thinks about this option some more.  She will follow-up in 4 to 6 weeks to see how much relief she gets from lumbar spine ESI and she can discuss surgery further with Dr. August Saucer at that point.  Patient agreed with plan.  Follow-Up Instructions: No follow-ups on file.   Orders:  Orders Placed  This Encounter  Procedures   Ambulatory referral to Physical Medicine Rehab   No orders of the defined types were placed in this encounter.     Procedures: No procedures performed   Clinical Data: No additional findings.  Objective: Vital Signs: There were no vitals taken for this visit.  Physical Exam:  Constitutional: Patient appears well-developed HEENT:  Head: Normocephalic Eyes:EOM are normal Neck: Normal range of motion Cardiovascular: Normal rate Pulmonary/chest: Effort normal Neurologic: Patient is alert Skin: Skin is warm Psychiatric: Patient has normal mood and affect  Ortho Exam: Ortho exam demonstrates right knee with positive effusion.  No tenderness over the medial joint line or lateral joint line.  She extends to 0 degrees and lacks the small amount of hyperextension she has on the contralateral side.  She has sharply positive patellar grind test and severe pain with compression of the patella against the trochlea to the point where she almost hit the examiner's hands away from the patient.  She is able to perform straight leg raise without extensor lag.  No pain with hip range of motion on the left side.  Very minimal trochanteric tenderness over the left greater trochanter with no tenderness over the right.  Specialty Comments:  No specialty comments available.  Imaging: No results found.   PMFS History: There are no problems to display for this patient.  No past medical history on file.  No family history on file.  No past surgical history on file. Social History   Occupational History   Not on file  Tobacco Use   Smoking status: Not on file   Smokeless tobacco: Not on file  Substance and Sexual Activity   Alcohol use: Not on file   Drug use: Not on file   Sexual activity: Not on file

## 2021-03-07 ENCOUNTER — Telehealth: Payer: Self-pay

## 2021-03-07 NOTE — Telephone Encounter (Signed)
Patient called regarding scheduling surgery I advised patient Linda Gill is out of the office today she will return Thursday patient is requesting a call back to schedule :[phone]

## 2021-03-09 ENCOUNTER — Ambulatory Visit: Payer: Self-pay

## 2021-03-09 ENCOUNTER — Other Ambulatory Visit: Payer: Self-pay

## 2021-03-09 ENCOUNTER — Ambulatory Visit (INDEPENDENT_AMBULATORY_CARE_PROVIDER_SITE_OTHER): Payer: BC Managed Care – PPO | Admitting: Physical Medicine and Rehabilitation

## 2021-03-09 VITALS — BP 131/85 | HR 73

## 2021-03-09 DIAGNOSIS — M5416 Radiculopathy, lumbar region: Secondary | ICD-10-CM | POA: Diagnosis not present

## 2021-03-09 MED ORDER — METHYLPREDNISOLONE ACETATE 80 MG/ML IJ SUSP
80.0000 mg | Freq: Once | INTRAMUSCULAR | Status: AC
  Administered 2021-03-09: 80 mg

## 2021-03-09 NOTE — Patient Instructions (Signed)

## 2021-03-09 NOTE — Progress Notes (Signed)
Pt state lower back pain that travels to her right hip. Pt state standing, sitting and laying down makes the pain worse. Pt state she doesn't take anything she deals with the pain.  Numeric Pain Rating Scale and Functional Assessment Average Pain 8   In the last MONTH (on 0-10 scale) has pain interfered with the following?  1. General activity like being  able to carry out your everyday physical activities such as walking, climbing stairs, carrying groceries, or moving a chair?  Rating(9)   +Driver, -BT, -Dye Allergies.

## 2021-03-10 NOTE — Procedures (Signed)
Lumbosacral Transforaminal Epidural Steroid Injection - Sub-Pedicular Approach with Fluoroscopic Guidance  Patient: Linda Gill      Date of Birth: March 13, 1976 MRN: [id number] PCP: Tracey Harries, MD      Visit Date: 03/09/2021   Universal Protocol:    Date/Time: 03/09/2021  Consent Given By: the patient  Position: PRONE  Additional Comments: Vital signs were monitored before and after the procedure. Patient was prepped and draped in the usual sterile fashion. The correct patient, procedure, and site was verified.   Injection Procedure Details:   Procedure diagnoses: Lumbar radiculopathy [M54.16]    Meds Administered:  Meds ordered this encounter  Medications   methylPREDNISolone acetate (DEPO-MEDROL) injection 80 mg    Laterality: Left  Location/Site:  L5-S1  Needle:5.0 in., 22 ga.  Short bevel or Quincke spinal needle  Needle Placement: Transforaminal  Findings:    -Comments: Excellent flow of contrast along the nerve, nerve root and into the epidural space.  Procedure Details: After squaring off the end-plates to get a true AP view, the C-arm was positioned so that an oblique view of the foramen as noted above was visualized. The target area is just inferior to the "nose of the scotty dog" or sub pedicular. The soft tissues overlying this structure were infiltrated with 2-3 ml. of 1% Lidocaine without Epinephrine.  The spinal needle was inserted toward the target using a "trajectory" view along the fluoroscope beam.  Under AP and lateral visualization, the needle was advanced so it did not puncture dura and was located close the 6 O'Clock position of the pedical in AP tracterory. Biplanar projections were used to confirm position. Aspiration was confirmed to be negative for CSF and/or blood. A 1-2 ml. volume of Isovue-250 was injected and flow of contrast was noted at each level. Radiographs were obtained for documentation purposes.   After attaining the desired  flow of contrast documented above, a 0.5 to 1.0 ml test dose of 0.25% Marcaine was injected into each respective transforaminal space.  The patient was observed for 90 seconds post injection.  After no sensory deficits were reported, and normal lower extremity motor function was noted,   the above injectate was administered so that equal amounts of the injectate were placed at each foramen (level) into the transforaminal epidural space.   Additional Comments:  The patient tolerated the procedure well Dressing: 2 x 2 sterile gauze and Band-Aid    Post-procedure details: Patient was observed during the procedure. Post-procedure instructions were reviewed.  Patient left the clinic in stable condition.

## 2021-03-10 NOTE — Progress Notes (Signed)
Linda Gill - 45 y.o. female MRN [id number]  Date of birth: 19-Feb-1976  Office Visit Note: Visit Date: 03/09/2021 PCP: Tracey Harries, MD Referred by: Tracey Harries, MD  Subjective: Chief Complaint  Patient presents with   Lower Back - Pain   Right Hip - Pain   HPI:  Linda Gill is a 45 y.o. female who comes in today at the request of Karenann Cai, PA-C for planned Left L5-S1 Lumbar Transforaminal epidural steroid injection with fluoroscopic guidance.  The patient has failed conservative care including home exercise, medications, time and activity modification.  This injection will be diagnostic and hopefully therapeutic.  Please see requesting physician notes for further details and justification. MRI reviewed with images and spine model.  MRI reviewed in the note below.  Prior intra-articular hip injection offered not much relief.  Her pain is still mostly posterior lateral.   ROS Otherwise per HPI.  Assessment & Plan: Visit Diagnoses:    ICD-10-CM   1. Lumbar radiculopathy  M54.16 XR C-ARM NO REPORT    Epidural Steroid injection    methylPREDNISolone acetate (DEPO-MEDROL) injection 80 mg      Plan: No additional findings.   Meds & Orders:  Meds ordered this encounter  Medications   methylPREDNISolone acetate (DEPO-MEDROL) injection 80 mg    Orders Placed This Encounter  Procedures   XR C-ARM NO REPORT   Epidural Steroid injection    Follow-up: No follow-ups on file.   Procedures: No procedures performed  Lumbosacral Transforaminal Epidural Steroid Injection - Sub-Pedicular Approach with Fluoroscopic Guidance  Patient: Linda Gill      Date of Birth: 06/29/1976 MRN: [id number] PCP: Tracey Harries, MD      Visit Date: 03/09/2021   Universal Protocol:    Date/Time: 03/09/2021  Consent Given By: the patient  Position: PRONE  Additional Comments: Vital signs were monitored before and after the procedure. Patient was prepped and draped in the  usual sterile fashion. The correct patient, procedure, and site was verified.   Injection Procedure Details:   Procedure diagnoses: Lumbar radiculopathy [M54.16]    Meds Administered:  Meds ordered this encounter  Medications   methylPREDNISolone acetate (DEPO-MEDROL) injection 80 mg    Laterality: Left  Location/Site:  L5-S1  Needle:5.0 in., 22 ga.  Short bevel or Quincke spinal needle  Needle Placement: Transforaminal  Findings:    -Comments: Excellent flow of contrast along the nerve, nerve root and into the epidural space.  Procedure Details: After squaring off the end-plates to get a true AP view, the C-arm was positioned so that an oblique view of the foramen as noted above was visualized. The target area is just inferior to the "nose of the scotty dog" or sub pedicular. The soft tissues overlying this structure were infiltrated with 2-3 ml. of 1% Lidocaine without Epinephrine.  The spinal needle was inserted toward the target using a "trajectory" view along the fluoroscope beam.  Under AP and lateral visualization, the needle was advanced so it did not puncture dura and was located close the 6 O'Clock position of the pedical in AP tracterory. Biplanar projections were used to confirm position. Aspiration was confirmed to be negative for CSF and/or blood. A 1-2 ml. volume of Isovue-250 was injected and flow of contrast was noted at each level. Radiographs were obtained for documentation purposes.   After attaining the desired flow of contrast documented above, a 0.5 to 1.0 ml test dose of 0.25% Marcaine was injected into each respective transforaminal  space.  The patient was observed for 90 seconds post injection.  After no sensory deficits were reported, and normal lower extremity motor function was noted,   the above injectate was administered so that equal amounts of the injectate were placed at each foramen (level) into the transforaminal epidural space.   Additional  Comments:  The patient tolerated the procedure well Dressing: 2 x 2 sterile gauze and Band-Aid    Post-procedure details: Patient was observed during the procedure. Post-procedure instructions were reviewed.  Patient left the clinic in stable condition.    Clinical History: MRI LUMBAR SPINE WITHOUT CONTRAST   TECHNIQUE: Multiplanar, multisequence MR imaging of the lumbar spine was performed. No intravenous contrast was administered.   COMPARISON:  Radiograph from 02/02/2021.   FINDINGS: Segmentation: Standard. Lowest well-formed disc space labeled the L5-S1 level.   Alignment: Physiologic with preservation of the normal lumbar lordosis. No listhesis.   Vertebrae: Vertebral body height maintained without acute or chronic fracture. Bone marrow signal intensity within normal limits. Subcentimeter benign hemangioma noted within the L3 vertebral body. No worrisome osseous lesions. Discogenic reactive endplate change present about the L4-5 and L5-S1 interspaces. No other abnormal marrow edema.   Conus medullaris and cauda equina: Conus extends to the T12-L1 level. Conus and cauda equina appear normal.   Paraspinal and other soft tissues: Paraspinous soft tissues within normal limits. Visualized visceral structures grossly within normal limits.   Disc levels:   T11-12: Seen only on sagittal projection. Mild disc bulge with disc desiccation with reactive endplate spurring. No stenosis.   T12-L1: Unremarkable.   L1-2: Negative interspace. Minimal facet spurring. No canal or foraminal stenosis.   L2-3: Negative interspace. Minimal facet hypertrophy. No canal or foraminal stenosis.   L3-4: Mild disc bulge with disc desiccation. Mild facet hypertrophy. No significant spinal stenosis. Foramina remain patent.   L4-5: Advanced degenerative intervertebral disc space narrowing with disc desiccation and mild disc bulge. Associated discogenic reactive endplate change with  marginal endplate osteophytic spurring. Mild bilateral facet hypertrophy with associated trace joint effusions. No significant canal or lateral recess stenosis. Mild right greater than left L4 foraminal narrowing. No frank impingement.   L5-S1: Degenerative intervertebral disc space narrowing with diffuse disc bulge and disc desiccation. Associated reactive endplate change with marginal endplate osteophytic spurring. Bulging disc mildly indents the ventral thecal sac without frank neural impingement. Mild facet hypertrophy. No canal or lateral recess stenosis. Foramina remain patent.   IMPRESSION: 1. Advanced degenerative disc disease with mild facet hypertrophy at L4-5 with resultant mild right greater than left L4 foraminal stenosis. 2. Disc bulging with reactive endplate change and facet hypertrophy at L5-S1 without significant stenosis or neural impingement. 3. Mild noncompressive disc bulging at L3-4 without stenosis or impingement. 4. No other findings to explain patient's lower extremity radicular symptoms identified.     Electronically Signed   By: Rise Mu M.D.   On: 02/26/2021 06:47     Objective:  VS:  HT:    WT:   BMI:     BP:131/85  HR:73bpm  TEMP: ( )  RESP:  Physical Exam Vitals and nursing note reviewed.  Constitutional:      General: She is not in acute distress.    Appearance: Normal appearance. She is not ill-appearing.  HENT:     Head: Normocephalic and atraumatic.     Right Ear: External ear normal.     Left Ear: External ear normal.  Eyes:     Extraocular Movements: Extraocular movements intact.  Cardiovascular:     Rate and Rhythm: Normal rate.     Pulses: Normal pulses.  Pulmonary:     Effort: Pulmonary effort is normal. No respiratory distress.  Abdominal:     General: There is no distension.     Palpations: Abdomen is soft.  Musculoskeletal:        General: Tenderness present.     Cervical back: Neck supple.     Right  lower leg: No edema.     Left lower leg: No edema.     Comments: Patient has good distal strength with no pain over the greater trochanters.  No clonus or focal weakness.  Skin:    Findings: No erythema, lesion or rash.  Neurological:     General: No focal deficit present.     Mental Status: She is alert and oriented to person, place, and time.     Sensory: No sensory deficit.     Motor: No weakness or abnormal muscle tone.     Coordination: Coordination normal.  Psychiatric:        Mood and Affect: Mood normal.        Behavior: Behavior normal.     Imaging: XR C-ARM NO REPORT  Result Date: 03/09/2021 Please see Notes tab for imaging impression.

## 2021-03-22 ENCOUNTER — Ambulatory Visit: Payer: BC Managed Care – PPO | Admitting: Orthopedic Surgery

## 2021-03-22 ENCOUNTER — Other Ambulatory Visit: Payer: Self-pay

## 2021-03-22 DIAGNOSIS — M25552 Pain in left hip: Secondary | ICD-10-CM | POA: Diagnosis not present

## 2021-03-23 ENCOUNTER — Encounter: Payer: Self-pay | Admitting: Orthopedic Surgery

## 2021-03-23 NOTE — Progress Notes (Signed)
Office Visit Note   Patient: Linda Gill           Date of Birth: 1975-12-27           MRN: [id number] Visit Date: 03/22/2021 Requested by: Tracey Harries, MD [address] [place],  [place] [address] PCP: Tracey Harries, MD  Subjective: Chief Complaint  Patient presents with   Left Hip - Follow-up    HPI: Linda Gill is a 45 year old patient with left hip and leg pain and low back pain as well as right knee pain.  Patient had left ESI on 02/08/2021.  She did get several hours of relief immediately after the injection but it did not persist more than 12 hours.  Hard for her to walk up a hill.  She is not having a lot of low back pain per se but mostly pain in the iliac crest region and just below.  She feels like something is restricting her motion.  No radiating symptoms below the knee.  She cannot exercise because of discomfort.  Her rheumatoid arthritis medication is helping her other joint complaints.  This problem does change her date.  Most of her symptoms are in the anterior superior iliac crest region and just below.  She does work from home.  She would like to do rowing type exercises but it is hard for her to do that.  She also reports knee pain.  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  This may require patellofemoral arthroplasty in the future.  She has had an intra-articular hip injection which did not give her any relief as well as the back injection.  She has some findings on her lumbar spine MRI which are softly suggestive of possible early radiculopathy.  These are not definitive findings but she did get several hours of relief with the back injection.              ROS: All systems reviewed are negative as they relate to the chief complaint within the history of present illness.  Patient denies  fevers or chills.   Assessment & Plan: Visit Diagnoses:  1. Pain in left  hip     Plan: Impression is left-sided hip pain with no response to intra-articular hip injection in 2 to 3 hours response to lumbar ESI.  She does have rheumatoid arthritis.  Need to assess whether or not there is an extra-articular muscular problem in the hip which is giving her the symptoms.  Alternatively it is possible that she may have intra-articular source of pain which is more clearly identifiable on MRI scan and plain radiographs.  Her radiographs look pretty reasonable.  MRI of the left hip to evaluate for synovitis and effusion versus muscular periarticular tearing is indicated at this time.  Follow-up after that study.  She will consider whether or not she is ready to undergo patellofemoral arthroplasty as well.  Follow-Up Instructions: Return for after MRI.   Orders:  Orders Placed This Encounter  Procedures   MR Hip Left w/o contrast   No orders of the defined types were placed in this encounter.     Procedures: No procedures performed   Clinical Data: No additional findings.  Objective: Vital Signs: There were no vitals taken for this visit.  Physical Exam:   Constitutional: Patient appears well-developed HEENT:  Head: Normocephalic Eyes:EOM are normal Neck: Normal range of motion Cardiovascular: Normal rate Pulmonary/chest: Effort normal  Neurologic: Patient is alert Skin: Skin is warm Psychiatric: Patient has normal mood and affect   Ortho Exam: Ortho exam is relatively unchanged.  No real groin pain with internal ex rotation of either leg.  Hip flexion strength is intact.  Ankle dorsiflexion plantarflexion strength also intact with no nerve root tension signs.  More patellofemoral crepitus present on the right than the left but no knee effusion.  Specialty Comments:  No specialty comments available.  Imaging: No results found.   PMFS History: There are no problems to display for this patient.  History reviewed. No pertinent past medical history.   History reviewed. No pertinent family history.  History reviewed. No pertinent surgical history. Social History   Occupational History   Not on file  Tobacco Use   Smoking status: Not on file   Smokeless tobacco: Not on file  Substance and Sexual Activity   Alcohol use: Not on file   Drug use: Not on file   Sexual activity: Not on file

## 2021-04-01 ENCOUNTER — Other Ambulatory Visit: Payer: Self-pay

## 2021-04-01 ENCOUNTER — Ambulatory Visit (HOSPITAL_BASED_OUTPATIENT_CLINIC_OR_DEPARTMENT_OTHER)
Admission: RE | Admit: 2021-04-01 | Discharge: 2021-04-01 | Disposition: A | Discharge status: Home / Self Care | Payer: BC Managed Care – PPO | Source: Ambulatory Visit | Attending: Orthopedic Surgery | Admitting: Orthopedic Surgery

## 2021-04-01 DIAGNOSIS — M25552 Pain in left hip: Secondary | ICD-10-CM | POA: Diagnosis present

## 2021-04-01 IMAGING — MR MR HIP*L* W/O CM
6 series · 40 of 40 positions shown · non-contrast
Comparison: None.

CLINICAL DATA: Left hip pain.  Decreased range of motion

EXAM:
MR OF THE LEFT HIP WITHOUT CONTRAST
TECHNIQUE: Multiplanar, multisequence MR imaging was performed. No intravenous
contrast was administered.

[Series 2: T1 · coronal · 4.0mm · 1.41mm/px · 7 of 36 slices shown]
[im 1/36]
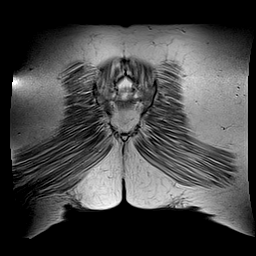
[im 6/36]
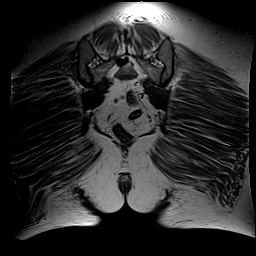
[im 12/36]
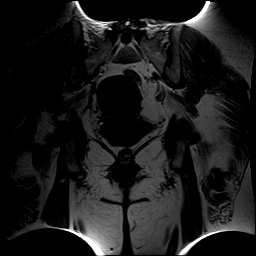
[im 18/36]
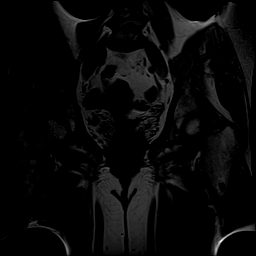
[im 24/36]
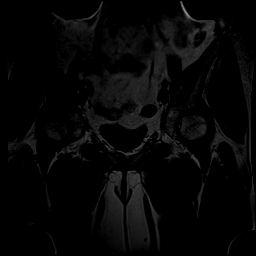
[im 30/36]
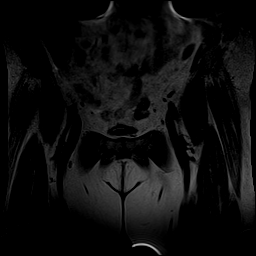
[im 36/36]
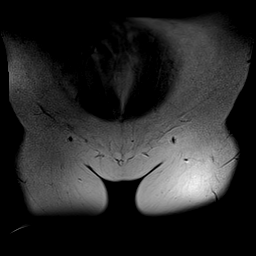

[Series 3: T2 fat-sat · coronal · 4.0mm · 1.41mm/px · 7 of 36 slices shown (1 of 2)]
[im 1/36]
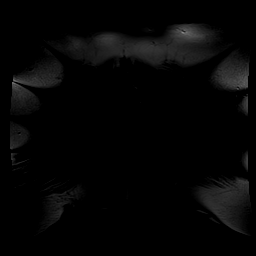
[im 6/36]
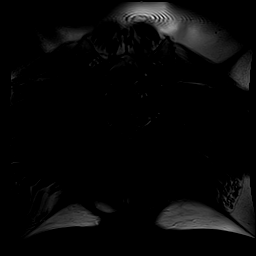
[im 12/36]
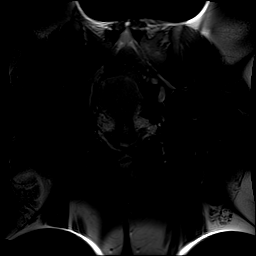
[im 18/36]
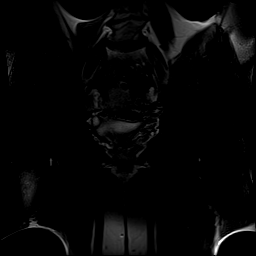
[im 24/36]
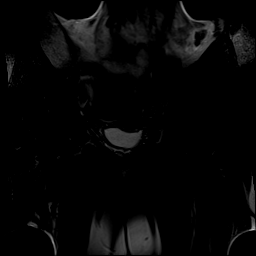
[im 30/36]
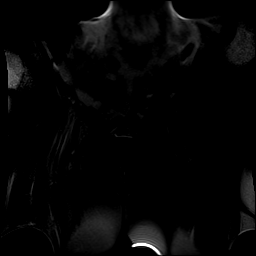
[im 36/36]
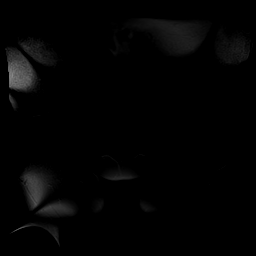

[Series 4: T2 fat-sat · axial · 4.0mm · 1.41mm/px · z∈[-93,+42]mm · 6 of 28 slices shown (2 of 2)]
[im 1/28]
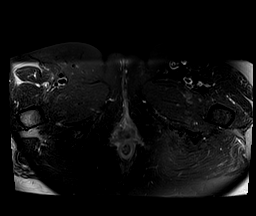
[im 6/28]
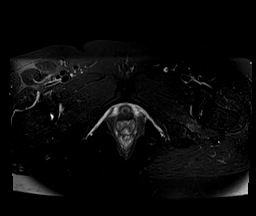
[im 11/28]
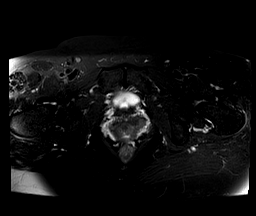
[im 17/28]
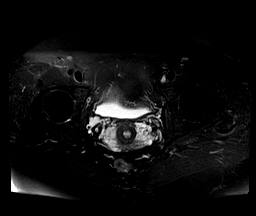
[im 22/28]
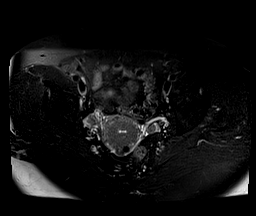
[im 28/28]
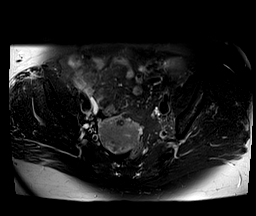

[Series 5: STIR · coronal · 4.0mm · 1.41mm/px · 8 of 36 slices shown]
[im 1/36]
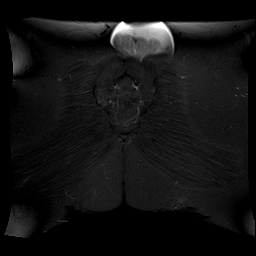
[im 6/36]
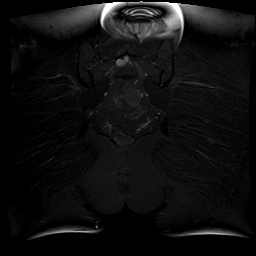
[im 11/36]
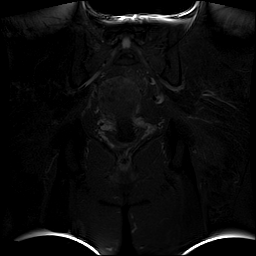
[im 16/36]
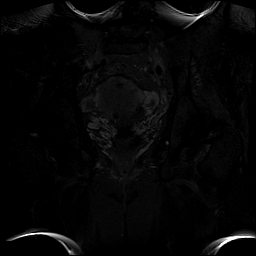
[im 21/36]
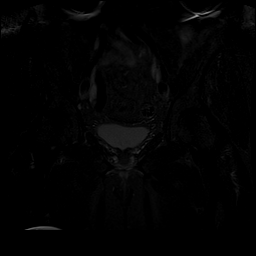
[im 26/36]
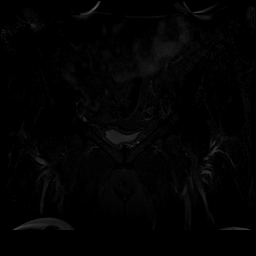
[im 31/36]
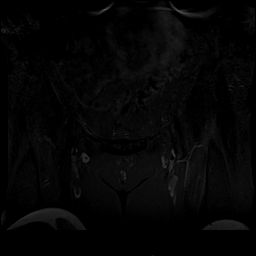
[im 36/36]
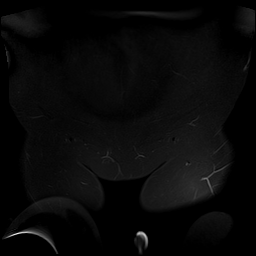

[Series 6: PD fat-sat · sagittal · 4.0mm · 0.70mm/px · 6 of 26 slices shown (1 of 2)]
[im 1/26]
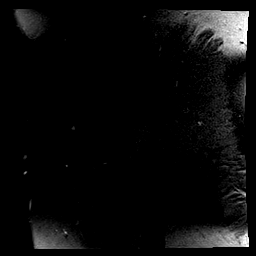
[im 6/26]
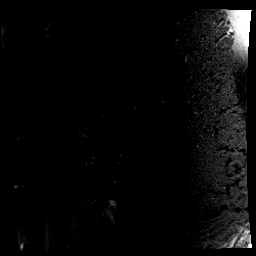
[im 11/26]
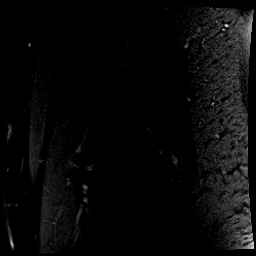
[im 16/26]
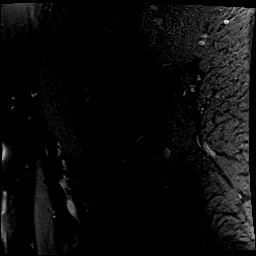
[im 21/26]
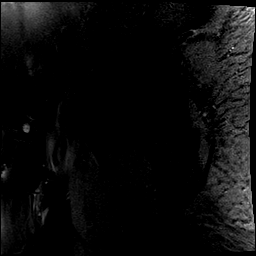
[im 26/26]
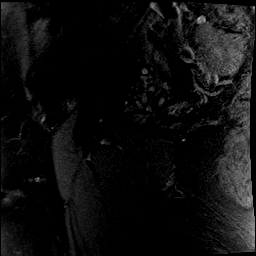

[Series 7: PD fat-sat · coronal · 4.0mm · 0.70mm/px · 6 of 26 slices shown (2 of 2)]
[im 1/26]
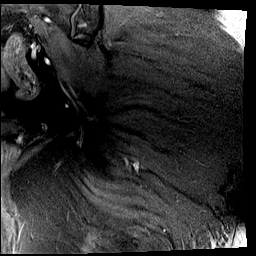
[im 6/26]
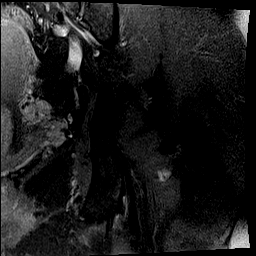
[im 11/26]
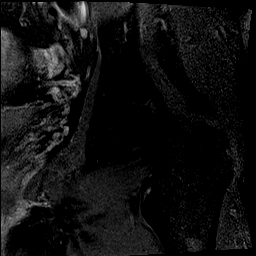
[im 16/26]
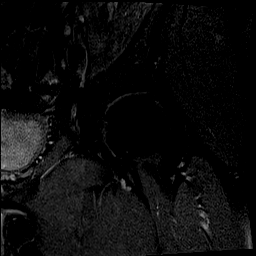
[im 21/26]
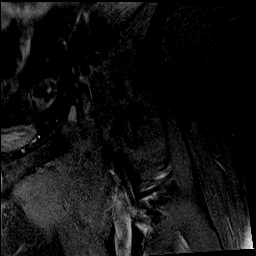
[im 26/26]
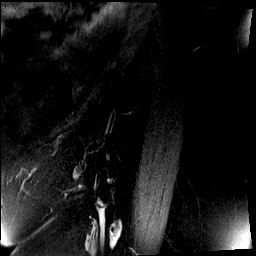

[40 of 40 positions shown; findings below may reference images not displayed]

FINDINGS: Bones:

No hip fracture, dislocation or avascular necrosis.

No periosteal reaction or bone destruction. No aggressive osseous
lesion.

Normal sacrum and sacroiliac joints. No SI joint widening or erosive
changes.

Degenerative disease with disc height loss at L4-5 and L5-S1.

Articular cartilage and labrum

Articular cartilage: Mild partial-thickness cartilage loss of the
left femoral head and acetabulum.

Labrum: Limited evaluation of the labrum secondary lack of
intra-articular fluid. Left labral degeneration with a left anterior
labral tear.

Joint or bursal effusion

Joint effusion:  No hip joint effusion.  No SI joint effusion.

Bursae:  No bursa formation.

Muscles and tendons

Flexors: Normal.

Extensors: Normal.

Abductors: Normal.

Adductors: Normal.

Gluteals: Mild tendinosis of the left gluteus minimus tendon
insertion with a small partial tear.

Hamstrings: Normal.

Other findings

No pelvic free fluid. No fluid collection or hematoma. No inguinal
lymphadenopathy. No inguinal hernia. Multiple small uterine fibroids
with the largest along the posterior aspect measuring 10 mm.
IMPRESSION: 1. Limited evaluation of the labrum secondary lack of
intra-articular fluid. Left labral degeneration with a left anterior
labral tear.
2. Mild partial-thickness cartilage loss of the left femoral head
and acetabulum.
3. Mild tendinosis of the left gluteus minimus tendon insertion with
a small partial tear.

## 2021-04-17 ENCOUNTER — Encounter: Payer: Self-pay | Admitting: Orthopedic Surgery

## 2021-04-17 ENCOUNTER — Other Ambulatory Visit: Payer: Self-pay

## 2021-04-17 ENCOUNTER — Ambulatory Visit: Payer: BC Managed Care – PPO | Admitting: Orthopedic Surgery

## 2021-04-17 DIAGNOSIS — M25552 Pain in left hip: Secondary | ICD-10-CM

## 2021-04-17 NOTE — Progress Notes (Signed)
Office Visit Note   Patient: Linda Gill           Date of Birth: 1976-04-02           MRN: [id number] Visit Date: 04/17/2021 Requested by: Tracey Harries, MD [address] [place],  [place] [address] PCP: Tracey Harries, MD  Subjective: Chief Complaint  Patient presents with   Left Hip - Follow-up    MRI review    HPI: Linda Gill is a 45 year old patient with severe left hip pain and significant right knee pain.  She has known history of patellofemoral arthritis on the right-hand side.  She states her left hip hurts her more.  She has had MRI scan of the lumbar spine which showed degenerative changes at L4-5 and L5-S1.  Nothing really correlating to the hip and the lumbar spine.  She has also had an ESI in the back which gave her no relief.  Since she has last been seen she has had a nonarthrogram MRI scan of the left hip.  Arthrogram was recommended but not performed.  Nonetheless that study did show degenerative anterior labral tear which does correlate with her voiding symptoms.  Notably she has had an injection which took her pain from a level of 8 down to 5 for about 1 or 2 hours for the duration of the anesthetic.  Complicating this work-up is a recent diagnosis of rheumatoid arthritis.  She has been placed on Biologics for that problem.  She is fairly exasperated with her situation and desires intervention to allow her to become more active.  She is an ex Pharmacist, hospital.              ROS: All systems reviewed are negative as they relate to the chief complaint within the history of present illness.  Patient denies  fevers or chills.   Assessment & Plan: Visit Diagnoses:  1. Left hip pain     Plan: Impression is left hip anterior labral tear without too much arthritis on MRI scan.  No effusion in either hip.  The anterior labral tear is visible but somewhat underwhelming in terms of explaining the severity of the pain that she has been having.  In terms of an  intervention I think her hip is bothering her more than the knee.  Recommend consideration of of hip arthroscopy and debridement versus repair of the labrum.  Also explained to the patient that sometimes the MRI scan underestimates the amount of arthritis present in the hip.  It could be that she may need hip replacement although I think that would be unusual without some more compelling radiographic evidence such as left hip effusion on MRI scan.  Advised her to come back after her hip surgery to consider patellofemoral replacement if her symptoms continue.  I will see her back as needed.  Follow-Up Instructions: No follow-ups on file.   Orders:  Orders Placed This Encounter  Procedures   Ambulatory referral to Orthopedic Surgery   No orders of the defined types were placed in this encounter.     Procedures: No procedures performed   Clinical Data: No additional findings.  Objective: Vital Signs: There were no vitals taken for this visit.  Physical Exam:   Constitutional: Patient appears well-developed HEENT:  Head: Normocephalic Eyes:EOM are normal Neck: Normal range of motion Cardiovascular: Normal rate Pulmonary/chest: Effort normal Neurologic: Patient is alert Skin: Skin is warm Psychiatric: Patient has normal mood and affect   Ortho Exam: Ortho  exam demonstrates slightly antalgic/Trendelenburg type gait to the left.  She does have groin pain on the left with internal and external rotation of the leg.  Patellofemoral crepitus is present bilaterally worse on the right than the left.  No nerve root tension signs.  Ankle dorsiflexion strength intact and symmetric bilaterally.  No real generalized synovitis in the other joints of the hands or feet.  Specialty Comments:  No specialty comments available.  Imaging: No results found.   PMFS History: There are no problems to display for this patient.  History reviewed. No pertinent past medical history.  History  reviewed. No pertinent family history.  History reviewed. No pertinent surgical history. Social History   Occupational History   Not on file  Tobacco Use   Smoking status: Not on file   Smokeless tobacco: Not on file  Substance and Sexual Activity   Alcohol use: Not on file   Drug use: Not on file   Sexual activity: Not on file

## 2021-04-25 ENCOUNTER — Encounter (HOSPITAL_BASED_OUTPATIENT_CLINIC_OR_DEPARTMENT_OTHER): Payer: Self-pay | Admitting: Orthopaedic Surgery

## 2021-04-25 ENCOUNTER — Other Ambulatory Visit: Payer: Self-pay

## 2021-04-25 ENCOUNTER — Ambulatory Visit (HOSPITAL_BASED_OUTPATIENT_CLINIC_OR_DEPARTMENT_OTHER): Payer: BC Managed Care – PPO | Admitting: Orthopaedic Surgery

## 2021-04-25 VITALS — BP 134/90 | Ht 73.0 in | Wt 250.6 lb

## 2021-04-25 DIAGNOSIS — M25552 Pain in left hip: Secondary | ICD-10-CM | POA: Diagnosis not present

## 2021-04-25 DIAGNOSIS — M1711 Unilateral primary osteoarthritis, right knee: Secondary | ICD-10-CM | POA: Diagnosis not present

## 2021-04-25 MED ORDER — TRIAMCINOLONE ACETONIDE 40 MG/ML IJ SUSP
80.0000 mg | INTRAMUSCULAR | Status: AC | PRN
  Administered 2021-04-25: 80 mg via INTRA_ARTICULAR

## 2021-04-25 MED ORDER — LIDOCAINE HCL 1 % IJ SOLN
4.0000 mL | INTRAMUSCULAR | Status: AC | PRN
  Administered 2021-04-25: 4 mL

## 2021-04-25 NOTE — Progress Notes (Signed)
Chief Complaint: Left hip and right knee pain     History of Present Illness:   Pain Score: 7/10 SANE: 40/100  Linda Gill is a 45 y.o. female presents today with left hip and right knee pain.  She states that she was recently diagnosed with rheumatoid arthritis over the last year at which time both the left hip and right knee pain have progressed.  She is previously been seen by Dr. Alphonzo Severance who referred her for possible evaluation of hip arthroscopy.  She is overall been very frustrated last several months as she states that she has gained some weight which is prohibiting her from being active in addition to her significant pain in the hip and right knee.  She usually enjoys being very active and previously has done CrossFit which she is no longer able to complete because of her pain.  She has a great Dane which she enjoys walking but is only able to do this at the most 1/4 mile as this causes pain.  She also endorses that going uphill causes her pain in both her hip and her knee.  She has had 1 x-ray guided injection in the left hip several months prior although she states that this did not give her any relief whatsoever.  She has not completed physical therapy.    Surgical History:   None  PMH/PSH/Family History/Social History/Meds/Allergies:   No past medical history on file. No past surgical history on file. Social History   Socioeconomic History  . Marital status: Unknown    Spouse name: Not on file  . Number of children: Not on file  . Years of education: Not on file  . Highest education level: Not on file  Occupational History  . Not on file  Tobacco Use  . Smoking status: Not on file  . Smokeless tobacco: Not on file  Substance and Sexual Activity  . Alcohol use: Not on file  . Drug use: Not on file  . Sexual activity: Not on file  Other Topics Concern  . Not on file  Social History Narrative  . Not on file   Social  Determinants of Health   Financial Resource Strain: Not on file  Food Insecurity: Not on file  Transportation Needs: Not on file  Physical Activity: Not on file  Stress: Not on file  Social Connections: Not on file   No family history on file. Allergies  Allergen Reactions  . Guaifenesin Hives  . Famotidine Rash  . Moxifloxacin Rash  . Penicillins Rash  . Shellfish Allergy Rash  . Sulfa Antibiotics Rash   Current Outpatient Medications  Medication Sig Dispense Refill  . Adalimumab (HUMIRA PEN) 40 MG/0.4ML PNKT Humira(CF) Pen 40 mg/0.4 mL subcutaneous kit    . calcium-vitamin D (OSCAL WITH D) 500-200 MG-UNIT TABS tablet Take 1 tablet by mouth daily.    . Cholecalciferol (VITAMIN D3) 1.25 MG (50000 UT) CAPS Vitamin D3    . Cholecalciferol 25 MCG (1000 UT) tablet Take by mouth.    . ciprofloxacin (CIPRO) 500 MG tablet ciprofloxacin 500 mg tablet    . FLUoxetine (PROZAC) 20 MG capsule fluoxetine 20 mg capsule    . fluticasone (FLONASE) 50 MCG/ACT nasal spray 2 sprays by Each Nare route daily.    . folic acid (FOLVITE) 1 MG  tablet folic acid 1 mg tablet    . loratadine (CLARITIN) 10 MG tablet Take by mouth.    . meloxicam (MOBIC) 15 MG tablet meloxicam 15 mg tablet    . methotrexate (RHEUMATREX) 2.5 MG tablet Take 15 mg by mouth once a week.    . montelukast (SINGULAIR) 10 MG tablet montelukast 10 mg tablet    . Multiple Vitamin (MULTI-VITAMINS) TABS Take by mouth.    . norethindrone-ethinyl estradiol (LOESTRIN) 1-20 MG-MCG tablet Take 1 tablet by mouth daily.    . Omega-3 Fatty Acids (FISH OIL) 500 MG CAPS Take 1 capsule by mouth daily.    Marland Kitchen omeprazole (PRILOSEC) 40 MG capsule Take 1 capsule by mouth daily.    . TURMERIC PO Take by mouth.    . vitamin B-12 (CYANOCOBALAMIN) 100 MCG tablet Vitamin B12    . vitamin B-12 (CYANOCOBALAMIN) 500 MCG tablet Take by mouth.     No current facility-administered medications for this visit.   No results found.  Review of Systems:   A ROS  was performed including pertinent positives and negatives as documented in the HPI.  Physical Exam :   Constitutional: NAD and appears stated age Neurological: Alert and oriented Psych: Appropriate affect and cooperative Blood pressure 134/90, height _0  (1.854 m), weight 250 lb 9.6 oz (113.7 kg).   Comprehensive Musculoskeletal Exam:    Inspection Right Left  Skin No atrophy or gross abnormalities appreciated No atrophy or gross abnormalities appreciated  Palpation    Tenderness None None  Crepitus None None  Range of Motion    Flexion (passive) 120 120  Extension 30 30  IR 30 no pain 20 with pain  ER 40 40  Strength    Flexion  5/5 5/5  Extension 5/5 5/5  Special Tests    FABIR Negative Negative  FADER Negative Negative  ER Lag/Capsular Insufficiency Negative Negative  Instability Negative Negative  Sacroiliac pain Negative  Negative   Instability    Generalized Laxity No No  Neurologic    sciatic, femoral, obturator nerves intact to light sensation  Vascular/Lymphatic    DP pulse 2+ 2+  Lumbar Exam    Patient has symmetric lumbar range of motion with negative pain referral to hip  +FADIR left hip    Musculoskeletal Exam  Gait Normal  Alignment Normal   Right Left  Inspection Normal Normal  Palpation    Tenderness Patellofemoral No  Crepitus None None  Effusion None None  Range of Motion    Extension -3 -3  Flexion 130 130  Strength    Extension 5/5 5/5  Flexion 5/5 5/5  Ligament Exam     Generalized Laxity No No  Lachman Negative Negative   Pivot Shift Negative Negative  Anterior Drawer Negative Negative  Valgus at 0 Negative Negative  Valgus at 20 Negative Negative  Varus at 0 0 0  Varus at 20   0 0  Posterior Drawer at 90 0 0  Vascular/Lymphatic Exam    Edema None None  Venous Stasis Changes No No  Distal Circulation Normal Normal  Neurologic    Light Touch Sensation Intact Intact  Special Tests:       Imaging:   Xray (3 views left  hip and 3 views right knee): She has a concentric left hip without evidence of cam or pincer lesions.  MRI (left hip and right knee): Left hip shows a focal tear in the labrum as well as right knee MRI shows lateral patellar  tilt with predominantly patellofemoral cartilage loss which is moderate  I personally reviewed and interpreted the radiographs.   Assessment:   45 year old female with left hip pain consistent with torn labrum.  Right knee pain is consistent with patellofemoral pain and predominantly patellofemoral cartilage loss.  Ultimately I discussed treatment options today with Linda Gill.  She is very tuned in to the fact that she has gained some weight from her baseline and would like to be able to return to previous levels of activity.  I support this idea fully and I recommended physical therapy so that we can work on left hip gluteal strengthening as well as right knee quad strengthening for her patellofemoral pain.  I have also recommended a left hip ultrasound-guided injection so that we can see exactly how much pain is coming from the hip.  She would like to pursue this today Plan :    -Plan for left hip ultrasound-guided injection today -Physical therapy prescribed for left hip gluteal strengthening as well as right quad strengthening in the setting of patellofemoral pain -We will see her back in 1 month to reassess the results of the hip injection   I personally saw and evaluated the patient, and participated in the management and treatment plan.  Vanetta Mulders, MD Attending Physician, Orthopedic Surgery    Procedure Note  Patient: Linda Gill             Date of Birth: 01-08-1976           MRN: [id number]             Visit Date: 04/25/2021  Procedures: Visit Diagnoses:  1. Left hip pain   2. Patellofemoral arthritis of right knee     Large Joint Inj: L hip joint on 04/25/2021 12:45 PM Indications: pain Details: 22 G 3.5 in needle, ultrasound-guided  anterolateral approach  Arthrogram: No  Medications: 4 mL lidocaine 1 %; 80 mg triamcinolone acetonide 40 MG/ML Outcome: tolerated well, no immediate complications Procedure, treatment alternatives, risks and benefits explained, specific risks discussed. Consent was given by the patient. Immediately prior to procedure a time out was called to verify the correct patient, procedure, equipment, support staff and site/side marked as required. Patient was prepped and draped in the usual sterile fashion.       This document was dictated using Systems analyst. A reasonable attempt at proof reading has been made to minimize errors.

## 2021-05-01 NOTE — Therapy (Signed)
OUTPATIENT PHYSICAL THERAPY LOWER EXTREMITY EVALUATION   Patient Name: Linda Gill MRN: [id number] DOB:Jun 29, 1976, 45 y.o., female Today's Date: 05/03/2021  PCP: Tracey Harries, MD REFERRING PROVIDER:Bokshan, Viviann Spare, MD    PT End of Session - 05/03/21 0756     Visit Number 1    Number of Visits 13    Authorization Type BCBS    PT Start Time 0756    PT Stop Time 0845    PT Time Calculation (min) 49 min    Activity Tolerance Patient tolerated treatment well    Behavior During Therapy [facility] for tasks assessed/performed             History reviewed. No pertinent past medical history. History reviewed. No pertinent surgical history. There are no problems to display for this patient.   ONSET DATE: 2021  REFERRING DIAG: M25.552 (ICD-10-CM) - Left hip pain M17.11 (ICD-10-CM) - Patellofemoral arthritis of right knee   THERAPY DIAG:  Pain in joint of right knee  Pain in left hip  Muscle weakness (generalized)  Difficulty walking  SUBJECTIVE:   SUBJECTIVE STATEMENT:  Pt states she started having her RA flare after getting her COVID-19 vaccines. When all of the RA issues started happening, she has barely been able to workout. "She states that she was recently diagnosed with rheumatoid arthritis over the last year at which time both the left hip and right knee pain have progressed."- from MD. She usually enjoys being very active and previously has done [employer] but is no longer able to bc of pain. Walking her dogs is very painful  and going up and down hills is very painful. Pt states the hip and knee pain came on with insidious onset. A few years ago she started with a personal trainer and started noticing her knee pain. She does not know when the knee pain started.   Pt states the injection from MD lasted about a day and half.  Pt states she has tried R quad strengthening exercise with squats but notices more shifts in to the L side. Pt states the L hip will catch and feel like  it gets stuck.  Described as a deep pain. The R knee pain happens with squatting/bending/lifting. Pt states the knee pain feels behind the knee cap. Pt states she is currently not doing any sort of stretching/mobility exercises.  Pt denies cancer red flags. Pt denies NT. Pt denies drop attacks.  PERTINENT HISTORY: RA, L labral tear  PAIN:  Are you having pain? Yes VAS scale: 4/10 in the hip, R 0/10  Pain location: R knee, L hip  Pain orientation: Right and Left  PAIN TYPE: throbbing Pain description: constant  Aggravating factors: squat, working out, walking for too long, sitting for too long, sleeping on L side, mowing the lawn Relieving factors: resting   PRECAUTIONS: None  WEIGHT BEARING RESTRICTIONS No  FALLS: Has patient fallen in last 6 months? No, but feels like she "drags and trips over the L leg."   PLOF: Independent, very active   PATIENT GOALS She wants to get back to working out without pain.    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   LE AROM/PROM:  A/PROM Right 05/03/2021 Left 05/03/2021  Hip flexion WNL 120  Hip extension WNL WNL PROM/ -5 deg AROM      Hip internal rotation WNL 15  deg p!  Hip external rotation WNL 35 deg p!  Knee flexion 130 136  Knee extension WNL WNL                   (Blank rows = not tested)  LE MMT:  MMT Right 05/03/2021  Left 05/03/2021  Hip flexion 5/5 4-/5  Hip abduction 5/5 4+/5  Hip adduction 5/5 5/5 with pain  Hip internal rotation    Hip external rotation    Knee flexion 5/5 WNL  Knee extension 4+/5 WNL                   (Blank rows = not tested)  Palpation: TTP R inf patellar pole, patellar tendon, med and lat inf patellar border; no TTP noted in L gluteal; hypertonicity of R quad  LOWER EXTREMITY SPECIAL TESTS:  Hip special tests: Luisa Hart (FABER) test: positive , Trendelenburg test: positive , Hip scouring test: positive , and Anterior hip impingement test: positive  and Knee special tests: Anterior drawer test: negative, Lachman Test: negative, Thessaly test: negative, and Patellafemoral grind test: positive   JOINT MOBILITY ASSESSMENT:  Limited R patellar mobility, may be from muscle guarding L hip stiffness with LAD, may be limited by hip muscle guarding   FUNCTIONAL TESTS:  5 times sit to stand: unable to perform due to pain Squat   decreased depth, painful with hip extension to flexion transition, R knee pain   GAIT: Distance walked: 8ft Comments: decreased L hip extension, antalgic, increased hip rotation compensation with terminal stance in L    Today's Treatment:  LAD grade II- irritable after 5 min (back spasm) R S/L L hip ABD iso GTB at knees 5s 5x Standing hip flexor stretch GENTLE 15s 3x  Seated knee extension iso 5s 10x   PATIENT EDUCATION:  Education details: MOI, diagnosis, prognosis, anatomy, exercise progression, DOMS expectations, muscle firing, HEP, POC Person educated: Patient Education method: Explanation, Demonstration, Tactile cues, Verbal cues, and Handouts Education comprehension: verbalized understanding, returned demonstration, verbal cues required, and tactile cues required   HOME EXERCISE PROGRAM:  Access Code: [email or web address] URL: [email or web address] Date: 05/03/2021 Prepared by: Zebedee Iba  Exercises Clamshell with Resistance - 1 x  daily - 7 x weekly - 3 sets - 5 reps - 5 hold Seated Isometric Knee Extension - 1 x daily - 7 x weekly - 3 sets - 5 reps - 5 hold Standing Hip Flexor Stretch - 2 x daily - 7 x weekly - 1 sets - 4 reps - 15 hold    ASSESSMENT:  CLINICAL IMPRESSION: Patient is a 45 y.o. female who was seen today for physical therapy evaluation and treatment for L hip pain and R knee pain. Objective impairments include Abnormal gait, decreased activity tolerance, decreased balance, decreased knowledge of condition, decreased mobility, difficulty walking, decreased ROM, decreased strength, hypomobility, impaired flexibility, improper body mechanics, and pain. Pt's s/s appear consistent with imaging of L hip labral defect and R anterior knee pain/ PFPS that is likely due to imaging showing cartilaginous degeneration. Pt's pain is highly sensitive and irritable at this time. Pt is highly motivated and requires reinforcement for graded exposure to activity/exercise. These impairments are limiting patient from cleaning, community activity, occupation, yard work, and exercise . Personal factors including Behavior pattern, Time since onset of injury/illness/exacerbation, and 1 comorbidity: RA  are also affecting patient's functional outcome. Patient will benefit from skilled PT to address above impairments and improve overall function.  REHAB POTENTIAL: Good  CLINICAL DECISION MAKING: Stable/uncomplicated  EVALUATION COMPLEXITY: Low   GOALS:   SHORT TERM GOALS:  STG Name Target Date Goal status  1 Pt will become independent with HEP in order to demonstrate synthesis of PT education.  05/17/2021 INITIAL  2 Pt will be able to demonstrate ability to perform STS/squat to parallel depth and no UE assist in order to demonstrate functional improvement in bilat LE function for self-care and house hold duties.   05/31/2021 INITIAL  3 Pt will be able to demonstrate ability to perform stairs and gait with at least neutral hip  extension and equal WB in order to demonstrate functional improvement in bilat LE function for self-care and house hold duties.   05/31/2021 INITIAL  4 Pt will have an at least 9 pt improvement in LEFS measure in order to demonstrate MCID improvement in daily function.  05/31/2021 INITIAL   LONG TERM GOALS:   LTG Name Target Date Goal status  1 Pt  will become independent with final HEP in order to demonstrate synthesis of PT education.  06/28/2021 INITIAL  2 Pt will be able to demonstrate/report ability to sit/stand/sleep for extended periods of time without pain in order to demonstrate functional improvement and tolerance to static positioning.  06/28/2021 INITIAL  3 Pt will be able to demonstrate DL squat with >/=96 lbs in order to demonstrate functional improvement in bilat LE strength for return to PLOF and exercise.  06/28/2021 INITIAL  4 Pt will have an at least 18 pt improvement in LEFS measure in order to demonstrate MCID improvement in daily function.   06/28/2021 INITIAL   PLAN: PT FREQUENCY: 1-2x/week  PT DURATION: 8 weeks  PLANNED INTERVENTIONS: Therapeutic exercises, Therapeutic activity, Neuro Muscular re-education, Balance training, Gait training, Patient/Family education, Joint mobilization, Stair training, Orthotic/Fit training, Aquatic Therapy, Dry Needling, Electrical stimulation, Spinal mobilization, Cryotherapy, Moist heat, scar mobilization, Taping, Vasopneumatic device, Ultrasound, Ionotophoresis 4mg /ml Dexamethasone, and Manual therapy  PLAN FOR NEXT SESSION: STM quad, quad stretching, gentle mulligan lateral hip mob, L lumbar mobs  PT, DPT 05/03/21 9:21 AM

## 2021-05-03 ENCOUNTER — Ambulatory Visit (HOSPITAL_BASED_OUTPATIENT_CLINIC_OR_DEPARTMENT_OTHER): Payer: BC Managed Care – PPO | Attending: Orthopaedic Surgery | Admitting: Physical Therapy

## 2021-05-03 ENCOUNTER — Other Ambulatory Visit: Payer: Self-pay

## 2021-05-03 ENCOUNTER — Encounter (HOSPITAL_BASED_OUTPATIENT_CLINIC_OR_DEPARTMENT_OTHER): Payer: Self-pay | Admitting: Physical Therapy

## 2021-05-03 DIAGNOSIS — M6281 Muscle weakness (generalized): Secondary | ICD-10-CM | POA: Insufficient documentation

## 2021-05-03 DIAGNOSIS — R262 Difficulty in walking, not elsewhere classified: Secondary | ICD-10-CM | POA: Insufficient documentation

## 2021-05-03 DIAGNOSIS — M25561 Pain in right knee: Secondary | ICD-10-CM | POA: Insufficient documentation

## 2021-05-03 DIAGNOSIS — M25552 Pain in left hip: Secondary | ICD-10-CM | POA: Diagnosis present

## 2021-05-09 ENCOUNTER — Other Ambulatory Visit: Payer: Self-pay

## 2021-05-09 ENCOUNTER — Encounter (HOSPITAL_BASED_OUTPATIENT_CLINIC_OR_DEPARTMENT_OTHER): Payer: Self-pay | Admitting: Physical Therapy

## 2021-05-09 ENCOUNTER — Ambulatory Visit (HOSPITAL_BASED_OUTPATIENT_CLINIC_OR_DEPARTMENT_OTHER): Payer: BC Managed Care – PPO | Admitting: Physical Therapy

## 2021-05-09 DIAGNOSIS — R262 Difficulty in walking, not elsewhere classified: Secondary | ICD-10-CM

## 2021-05-09 DIAGNOSIS — M25561 Pain in right knee: Secondary | ICD-10-CM

## 2021-05-09 DIAGNOSIS — M25552 Pain in left hip: Secondary | ICD-10-CM

## 2021-05-09 DIAGNOSIS — M6281 Muscle weakness (generalized): Secondary | ICD-10-CM

## 2021-05-09 NOTE — Therapy (Signed)
OUTPATIENT PHYSICAL THERAPY TREATMENT NOTE   Patient Name: KEYUANA WANK MRN: [id number] DOB:February 26, 1976, 45 y.o., female Today's Date: 05/09/2021  PCP: Tracey Harries, MD REFERRING PROVIDER: Tracey Harries, MD   PT End of Session - 05/09/21 [phone]     Visit Number 2    Number of Visits 13    Authorization Type BCBS    PT Start Time [phone]    PT Stop Time 0925    PT Time Calculation (min) 42 min    Activity Tolerance Patient tolerated treatment well    Behavior During Therapy [facility] for tasks assessed/performed             History reviewed. No pertinent past medical history. History reviewed. No pertinent surgical history. There are no problems to display for this patient.   REFERRING DIAG: ONSET DATE: 2021   REFERRING DIAG: M25.552 (ICD-10-CM) - Left hip pain M17.11 (ICD-10-CM) - Patellofemoral arthritis of right knee    THERAPY DIAG:  Pain in joint of right knee   Pain in left hip   Muscle weakness (generalized)   Difficulty walking   SUBJECTIVE:    SUBJECTIVE STATEMENT:  Pt states that the hip pain is about the same. She did not notice a large difference with the exercises. She states no increase in pain.                                                                                                                                                                                                       PERTINENT HISTORY: RA, L labral tear   PAIN:  Are you having pain? Yes VAS scale: 8/10 in the hip, R 2/10  Pain location: R knee, L hip  Pain orientation: Right and Left  PAIN TYPE: throbbing, aching Pain description: constant  Aggravating factors: squat, working out, walking for too long, sitting for too long, sleeping on L side, mowing the lawn Relieving factors: resting     THERAPY DIAG:  Pain in joint of right knee  Pain in left hip  Muscle weakness (generalized)  Difficulty walking  PERTINENT HISTORY: PERTINENT HISTORY: RA, L labral tear    PRECAUTIONS: None    OBJECTIVE:   Antalgic gait   TODAY'S TREATMENT: Today's Treatment:  L1-5 CPA grade IV, L L1-5 UPA grade IV STM- L QL, paraspinals, and L hip R S/L L hip ABD iso GTB at knees 5s 10x- supine and S/L  S/L quad stretch with strap 30s 3x- bilat, strap on R needed Standing hip flexor stretch GENTLE 15s 3x  Seated knee extension iso 5s 10x  Box squat- mid thigh height cued for hip ext 20x RDL 15lb KB 15x     PATIENT EDUCATION:  Education details: MOI, diagnosis, prognosis, anatomy, exercise progression, DOMS expectations, muscle firing, HEP, POC Person educated: Patient Education method: Explanation, Demonstration, Tactile cues, Verbal cues, and Handouts Education comprehension: verbalized understanding, returned demonstration, verbal cues required, and tactile cues required     HOME EXERCISE PROGRAM:   Access Code: [email or web address]      ASSESSMENT:   CLINICAL IMPRESSION: Pt able to perform HEP with mod cuing for position and technique. Pt was able to introduce quad and hip flexor stretching exercise without increased pain. Pt still with difficulty during hip flexion above 90 deg and anti-gravity movements. Pt had minimal changes with L/S mobilization but did have improved hip extension with gait. Pt able to perform elevated height box squats and RDL without signficant increase in pain. Plan to assess for response at next session and continue with CKC strengthening as tolerated. OKC appears to be more aggravating. Pt would benefit from continued skilled therapy in order to reach goals and maximize functional bilat LE  strength and ROM for full return to PLOF.   REHAB POTENTIAL: Good   CLINICAL DECISION MAKING: Stable/uncomplicated   EVALUATION COMPLEXITY: Low     GOALS:     SHORT TERM GOALS:   STG Name Target Date Goal status  1 Pt will become independent with HEP in order to demonstrate synthesis of PT education.   05/17/2021 INITIAL  2 Pt will be able to  demonstrate ability to perform STS/squat to parallel depth and no UE assist in order to demonstrate functional improvement in bilat LE function for self-care and house hold duties.   05/31/2021 INITIAL  3 Pt will be able to demonstrate ability to perform stairs and gait with at least neutral hip extension and equal WB in order to demonstrate functional improvement in bilat LE function for self-care and house hold duties.     05/31/2021 INITIAL  4 Pt will have an at least 9 pt improvement in LEFS measure in order to demonstrate MCID improvement in daily function.   05/31/2021 INITIAL    LONG TERM GOALS:    LTG Name Target Date Goal status  1 Pt  will become independent with final HEP in order to demonstrate synthesis of PT education.   06/28/2021 INITIAL  2 Pt will be able to demonstrate/report ability to sit/stand/sleep for extended periods of time without pain in order to demonstrate functional improvement and tolerance to static positioning.   06/28/2021 INITIAL  3 Pt will be able to demonstrate DL squat with >/=05 lbs in order to demonstrate functional improvement in bilat LE strength for return to PLOF and exercise.   06/28/2021 INITIAL  4 Pt will have an at least 18 pt improvement in LEFS measure in order to demonstrate MCID improvement in daily function.     06/28/2021 INITIAL    PLAN: PT FREQUENCY: 1-2x/week   PT DURATION: 8 weeks   PLANNED INTERVENTIONS: Therapeutic exercises, Therapeutic activity, Neuro Muscular re-education, Balance training, Gait training, Patient/Family education, Joint mobilization, Stair training, Orthotic/Fit training, Aquatic Therapy, Dry Needling, Electrical stimulation, Spinal mobilization, Cryotherapy, Moist heat, scar mobilization, Taping, Vasopneumatic device, Ultrasound, Ionotophoresis 4mg /ml Dexamethasone, and Manual therapy   PLAN FOR NEXT SESSION: lumbar mobs, hip flexor STM, CKC strengthening DL, box squat with band    PT, DPT 05/09/21  9:07 AM      [facility] [address]  [address] [place], [place], [address] Phone: [phone]   Fax:  [phone]  Patient name: RABECCA BIRGE MRN: [id number] DOB: September 10, 1975

## 2021-05-12 ENCOUNTER — Encounter (HOSPITAL_BASED_OUTPATIENT_CLINIC_OR_DEPARTMENT_OTHER): Payer: BC Managed Care – PPO | Admitting: Physical Therapy

## 2021-05-17 ENCOUNTER — Other Ambulatory Visit: Payer: Self-pay

## 2021-05-17 ENCOUNTER — Encounter (HOSPITAL_BASED_OUTPATIENT_CLINIC_OR_DEPARTMENT_OTHER): Payer: Self-pay | Admitting: Physical Therapy

## 2021-05-17 ENCOUNTER — Ambulatory Visit (HOSPITAL_BASED_OUTPATIENT_CLINIC_OR_DEPARTMENT_OTHER): Payer: BC Managed Care – PPO | Admitting: Physical Therapy

## 2021-05-17 DIAGNOSIS — R262 Difficulty in walking, not elsewhere classified: Secondary | ICD-10-CM

## 2021-05-17 DIAGNOSIS — M6281 Muscle weakness (generalized): Secondary | ICD-10-CM

## 2021-05-17 DIAGNOSIS — M25561 Pain in right knee: Secondary | ICD-10-CM

## 2021-05-17 DIAGNOSIS — M25552 Pain in left hip: Secondary | ICD-10-CM

## 2021-05-17 NOTE — Therapy (Signed)
OUTPATIENT PHYSICAL THERAPY TREATMENT NOTE   Patient Name: Linda Gill MRN: [id number] DOB:12-27-1975, 45 y.o., female Today's Date: 05/17/2021  PCP: Tracey Harries, MD REFERRING PROVIDER: Tracey Harries, MD   PT End of Session - 05/17/21 1100     Visit Number 3    Number of Visits 13    Authorization Type BCBS    PT Start Time 0805    PT Stop Time 0845    PT Time Calculation (min) 40 min    Activity Tolerance Patient tolerated treatment well;Patient limited by pain    Behavior During Therapy [facility] for tasks assessed/performed              History reviewed. No pertinent past medical history. History reviewed. No pertinent surgical history. There are no problems to display for this patient.   REFERRING DIAG: ONSET DATE: 2021   REFERRING DIAG: M25.552 (ICD-10-CM) - Left hip pain M17.11 (ICD-10-CM) - Patellofemoral arthritis of right knee    THERAPY DIAG:  Pain in joint of right knee   Pain in left hip   Muscle weakness (generalized)   Difficulty walking   SUBJECTIVE:    SUBJECTIVE STATEMENT:  Pt states she was sore after last session. She states that she has an ache down the leg and into the L glute. She feels that sitting down has to get repositioned in order to get comfortable. Sleeping has also been tough.                                                                                                                                                                                                       PERTINENT HISTORY: RA, L labral tear   PAIN:  Are you having pain? Yes VAS scale: 5/10 in the hip Pain location: R knee, L hip  Pain orientation: Right and Left  PAIN TYPE: throbbing, aching Pain description: constant  Aggravating factors: squat, working out, walking for too long, sitting for too long, sleeping on L side, mowing the lawn Relieving factors: resting     THERAPY DIAG:  Pain in joint of right knee  Pain in left hip  Muscle weakness  (generalized)  Difficulty walking  PERTINENT HISTORY: PERTINENT HISTORY: RA, L labral tear   PRECAUTIONS: None    OBJECTIVE:   Antalgic gait   TODAY'S TREATMENT:  09/28 Treatment:  L1-5 CPA grade IV, L L1-5 UPA grade IV STM- L QL, paraspinals, and L hip   Bosu squat 2x10 Seated leg press with alternating staggered stance 2x15 115lbs 16kg KB Box squat- mid thigh height blue TB  around knees 3x10  RDL 16kg KB 2x12     PATIENT EDUCATION:  Education details: anatomy, exercise progression, DOMS expectations, recovery times, pre-surgical strengthening, RPE based strength training Person educated: Patient Education method: Explanation, Demonstration, Tactile cues, Verbal cues, and Handouts Education comprehension: verbalized understanding, returned demonstration, verbal cues required, and tactile cues required     HOME EXERCISE PROGRAM:   Access Code: [email or web address]      ASSESSMENT:   CLINICAL IMPRESSION: Pt was able to progress strengthening activity at today's session with increase in baseline level of irritation. Pt with motor control deficits on pliant surface training, suggesting uneven strength output between LE as well as stability deficits at the L hip and R knee. Pt given thorough edu about pain/soreness expectations, recovery periods between sessions in order to continue with strengthening as able. Pt reports strongly considering  the surgical repair route, so PT will focus on strengthening as tolerated with aquatic visits PRN in order to increase pre-surgical capacity. Pt would benefit from continued skilled therapy in order to reach goals and maximize functional bilat LE  strength and ROM for full return to PLOF.   REHAB POTENTIAL: Good   CLINICAL DECISION MAKING: Stable/uncomplicated   EVALUATION COMPLEXITY: Low     GOALS:     SHORT TERM GOALS:   STG Name Target Date Goal status  1 Pt will become independent with HEP in order to demonstrate synthesis of PT  education.   05/17/2021 INITIAL  2 Pt will be able to demonstrate ability to perform STS/squat to parallel depth and no UE assist in order to demonstrate functional improvement in bilat LE function for self-care and house hold duties.   05/31/2021 INITIAL  3 Pt will be able to demonstrate ability to perform stairs and gait with at least neutral hip extension and equal WB in order to demonstrate functional improvement in bilat LE function for self-care and house hold duties.     05/31/2021 INITIAL  4 Pt will have an at least 9 pt improvement in LEFS measure in order to demonstrate MCID improvement in daily function.   05/31/2021 INITIAL    LONG TERM GOALS:    LTG Name Target Date Goal status  1 Pt  will become independent with final HEP in order to demonstrate synthesis of PT education.   06/28/2021 INITIAL  2 Pt will be able to demonstrate/report ability to sit/stand/sleep for extended periods of time without pain in order to demonstrate functional improvement and tolerance to static positioning.   06/28/2021 INITIAL  3 Pt will be able to demonstrate DL squat with >/=61 lbs in order to demonstrate functional improvement in bilat LE strength for return to PLOF and exercise.   06/28/2021 INITIAL  4 Pt will have an at least 18 pt improvement in LEFS measure in order to demonstrate MCID improvement in daily function.     06/28/2021 INITIAL    PLAN: PT FREQUENCY: 1-2x/week   PT DURATION: 8 weeks   PLANNED INTERVENTIONS: Therapeutic exercises, Therapeutic activity, Neuro Muscular re-education, Balance training, Gait training, Patient/Family education, Joint mobilization, Stair training, Orthotic/Fit training, Aquatic Therapy, Dry Needling, Electrical stimulation, Spinal mobilization, Cryotherapy, Moist heat, scar mobilization, Taping, Vasopneumatic device, Ultrasound, Ionotophoresis 4mg /ml Dexamethasone, and Manual therapy   PLAN FOR NEXT SESSION: leg press with increased weight, revisit Bosu  squatting, knee extension iso at 90, SL stability, assess response to previous session loading   PT, DPT 05/17/21 11:09 AM     [facility]  [address] [place], [place], [address] Phone: [phone]   Fax:  [phone]  Patient name: Linda Gill MRN: [id number] DOB: February 06, 1976

## 2021-05-18 ENCOUNTER — Other Ambulatory Visit: Payer: Self-pay

## 2021-05-18 ENCOUNTER — Emergency Department (HOSPITAL_COMMUNITY)
Admission: EM | Admit: 2021-05-18 | Discharge: 2021-05-19 | Disposition: A | Discharge status: Home / Self Care | Payer: BC Managed Care – PPO | Attending: Physician Assistant | Admitting: Physician Assistant

## 2021-05-18 ENCOUNTER — Encounter (HOSPITAL_COMMUNITY): Payer: Self-pay

## 2021-05-18 ENCOUNTER — Emergency Department (HOSPITAL_COMMUNITY): Payer: BC Managed Care – PPO

## 2021-05-18 DIAGNOSIS — R0789 Other chest pain: Secondary | ICD-10-CM | POA: Diagnosis not present

## 2021-05-18 DIAGNOSIS — Z5321 Procedure and treatment not carried out due to patient leaving prior to being seen by health care provider: Secondary | ICD-10-CM | POA: Diagnosis not present

## 2021-05-18 DIAGNOSIS — R0602 Shortness of breath: Secondary | ICD-10-CM | POA: Diagnosis not present

## 2021-05-18 DIAGNOSIS — R059 Cough, unspecified: Secondary | ICD-10-CM | POA: Insufficient documentation

## 2021-05-18 HISTORY — DX: Unspecified osteoarthritis, unspecified site: M19.90

## 2021-05-18 LAB — CBC
HCT: 42.4 % (ref 36.0–46.0)
Hemoglobin: 13.8 g/dL (ref 12.0–15.0)
MCH: 29.5 pg (ref 26.0–34.0)
MCHC: 32.5 g/dL (ref 30.0–36.0)
MCV: 90.6 fL (ref 80.0–100.0)
Platelets: 298 K/uL (ref 150–400)
RBC: 4.68 MIL/uL (ref 3.87–5.11)
RDW: 13.5 % (ref 11.5–15.5)
WBC: 9.6 K/uL (ref 4.0–10.5)
nRBC: 0 % (ref 0.0–0.2)

## 2021-05-18 NOTE — ED Provider Notes (Signed)
Emergency Medicine Provider Triage Evaluation Note  Linda Gill , a 45 y.o. female  was evaluated in triage.  Pt complains of as of breath.  States that she started to develop cold-like symptoms on Sunday, was taking over-the-counter medicines, started to improve but today started to develop chest tightness and shortness of breath, and lost her voice today.  She has had negative COVID test x2.  Denies any chest pain.  Has had a productive cough.  Reports feeling like she cannot catch her breath.  No recent travel, no leg swelling.  No history of asthma.  Review of Systems  Positive: As above Negative: As above  Physical Exam  BP (!) 156/99 (BP Location: Left Arm)   Pulse 82   Temp 98.5 F (36.9 C)   Resp 19   Ht 5\' 6"  (1.676 m)   Wt 113.4 kg   SpO2 97%   BMI 40.35 kg/m  Gen:   Awake, no distress   Resp:  Normal effort  MSK:   Moves extremities without difficulty  Other:  Audible wheezes noted, voice hoarse,   Medical Decision Making  Medically screening exam initiated at 11:36 PM.  Appropriate orders placed.  Linda Gill was informed that the remainder of the evaluation will be completed by another provider, this initial triage assessment does not replace that evaluation, and the importance of remaining in the ED until their evaluation is complete.     Mariane Masters, PA-C 05/18/21 2338    2339, DO 05/19/21 [phone]

## 2021-05-18 NOTE — ED Triage Notes (Signed)
Pt reports SOB, feeling bad since Sunday associated with chest tightness and productive cough. State she lost her voice today

## 2021-05-19 ENCOUNTER — Encounter (HOSPITAL_BASED_OUTPATIENT_CLINIC_OR_DEPARTMENT_OTHER): Payer: BC Managed Care – PPO | Admitting: Physical Therapy

## 2021-05-19 LAB — BASIC METABOLIC PANEL
Anion gap: 8 (ref 5–15)
BUN: 7 mg/dL (ref 6–20)
CO2: 22 mmol/L (ref 22–32)
Calcium: 8.5 mg/dL — ABNORMAL LOW (ref 8.9–10.3)
Chloride: 108 mmol/L (ref 98–111)
Creatinine, Ser: 0.62 mg/dL (ref 0.44–1.00)
GFR, Estimated: >60 mL/min (ref >60)
Glucose, Bld: 104 mg/dL — ABNORMAL HIGH (ref 70–99)
Potassium: 4.9 mmol/L (ref 3.5–5.1)
Sodium: 138 mmol/L (ref 135–145)

## 2021-05-19 IMAGING — DX DG CHEST 2V
2 series · 2 of 2 positions shown · non-contrast
Comparison: None.

CLINICAL DATA: Shortness of breath.

EXAM:
CHEST - 2 VIEW

[w chest pa]
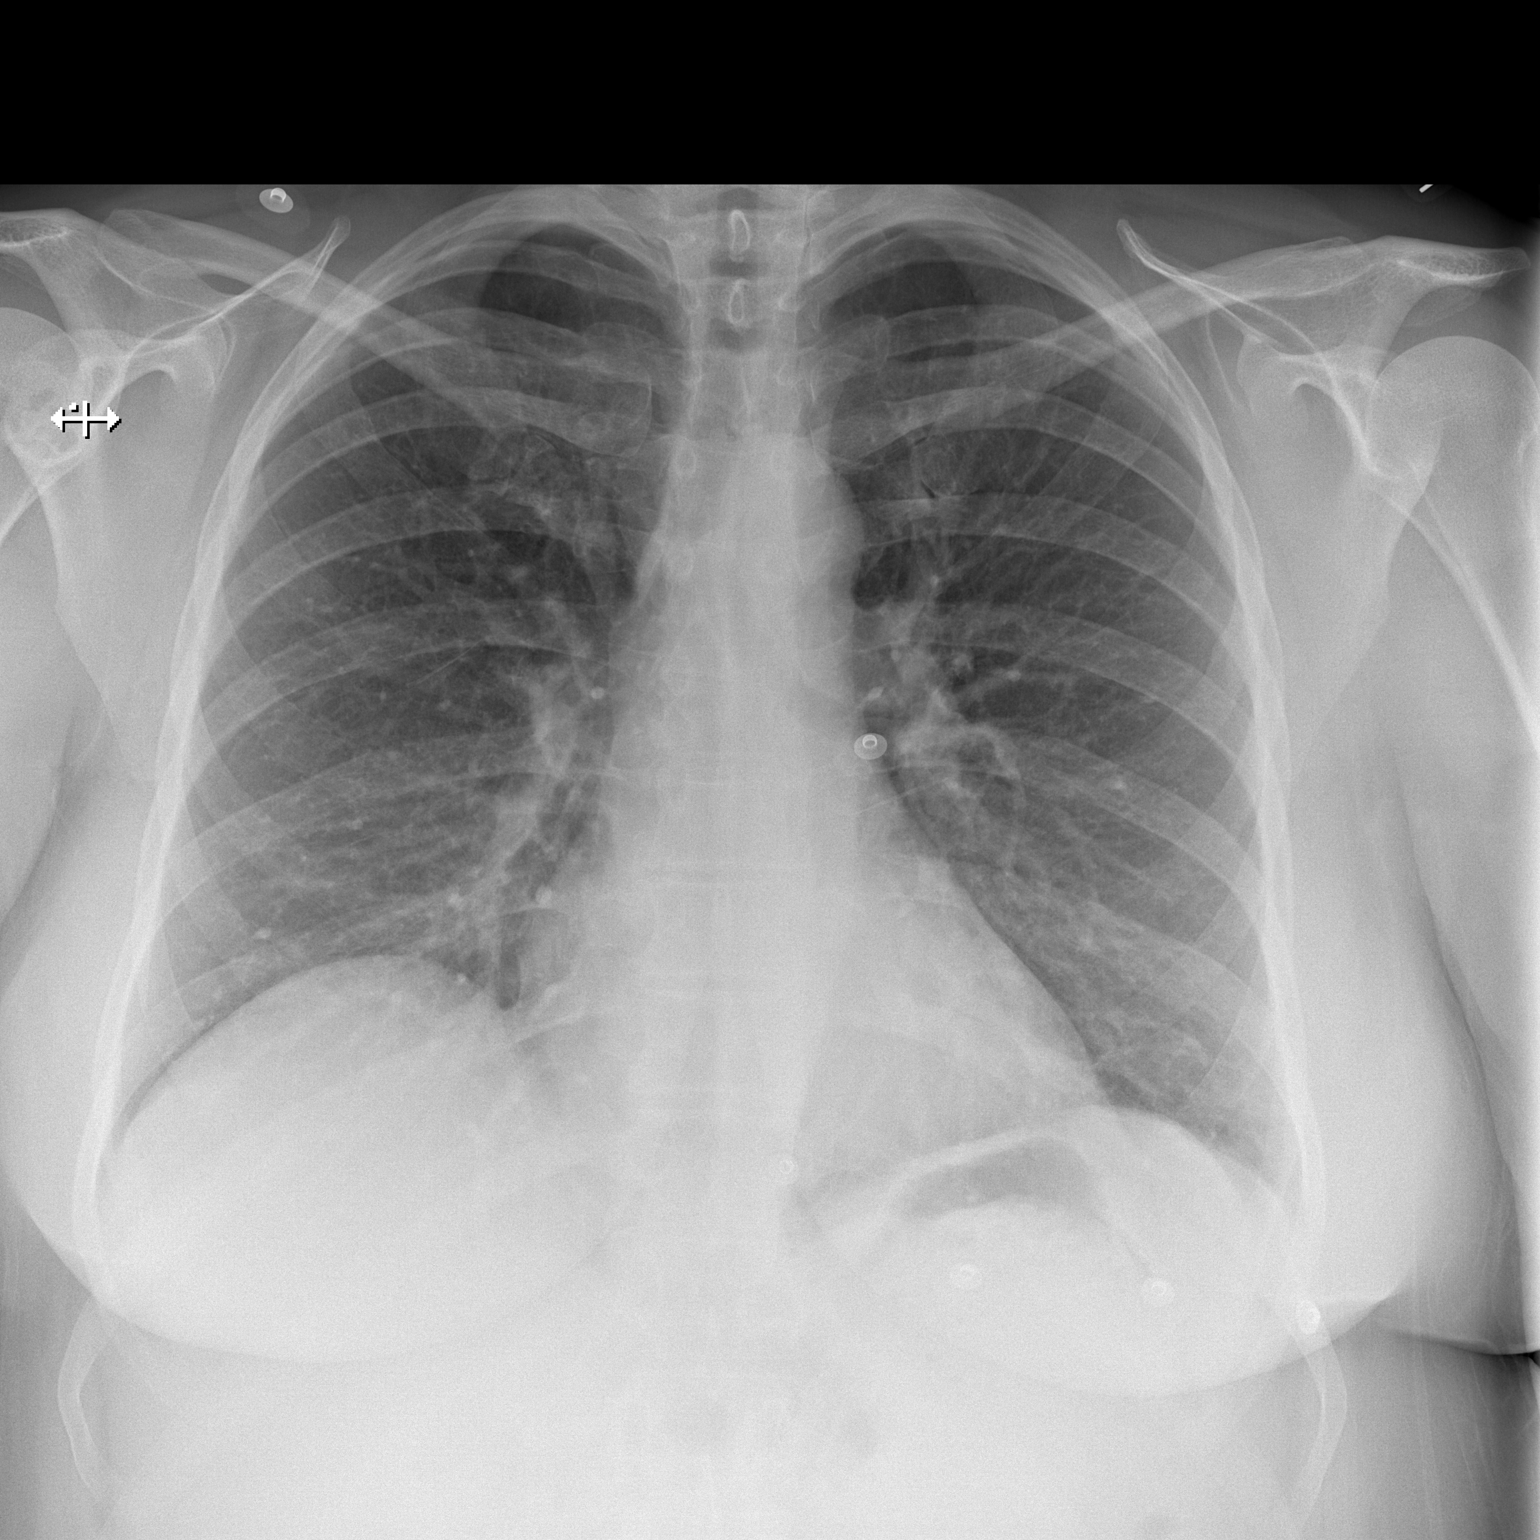

[w chest lat]
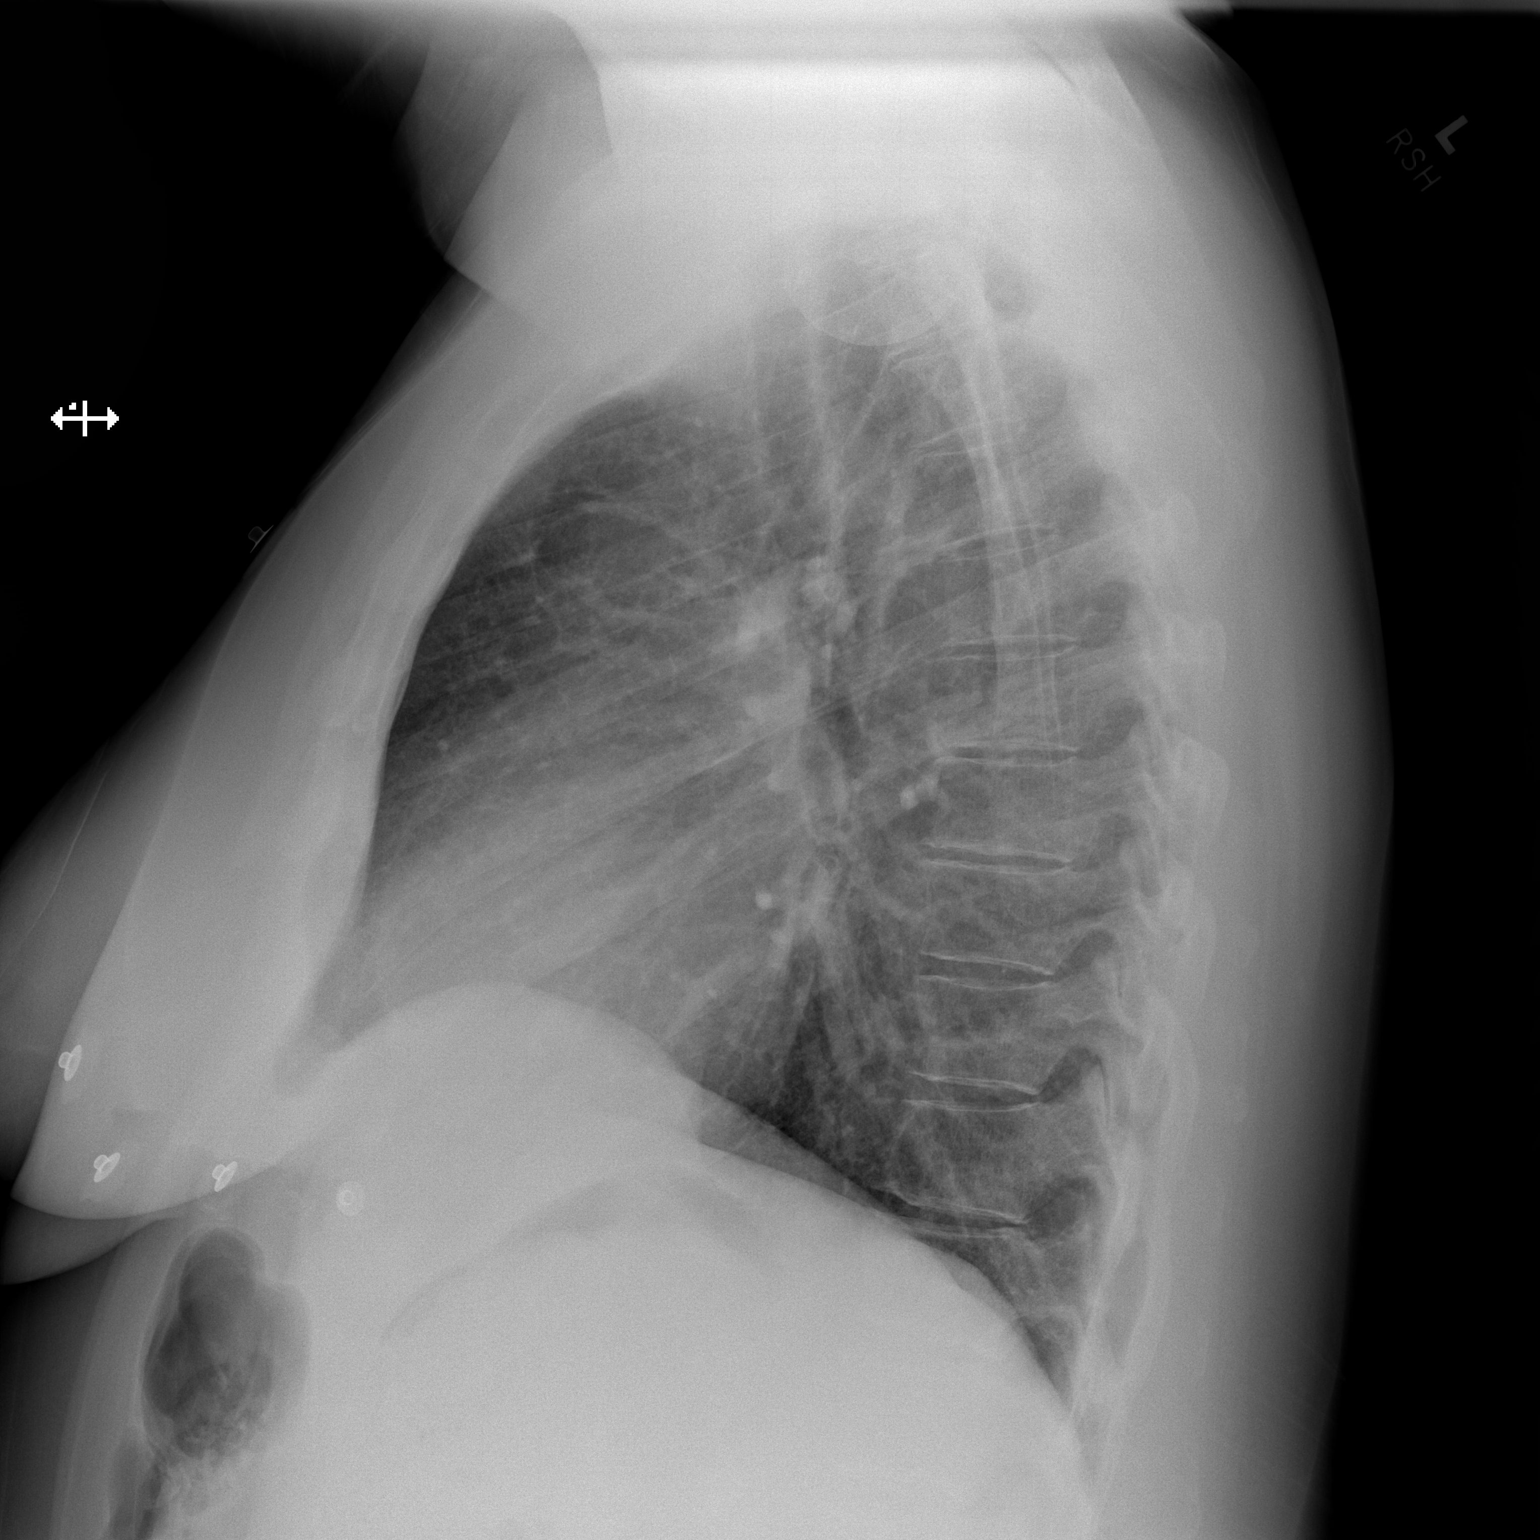

[2 of 2 positions shown; findings below may reference images not displayed]

FINDINGS: The heart size and mediastinal contours are within normal limits.
Both lungs are clear. The visualized skeletal structures are
unremarkable.
IMPRESSION: No active cardiopulmonary disease.

## 2021-05-19 NOTE — ED Notes (Signed)
Patient states she is leaving. 

## 2021-05-19 NOTE — ED Notes (Signed)
Patients support person states patient is coming back in to be seen

## 2021-05-19 NOTE — ED Notes (Signed)
Patient left.

## 2021-05-23 ENCOUNTER — Ambulatory Visit (HOSPITAL_BASED_OUTPATIENT_CLINIC_OR_DEPARTMENT_OTHER): Payer: BC Managed Care – PPO | Attending: Orthopaedic Surgery | Admitting: Physical Therapy

## 2021-05-23 ENCOUNTER — Ambulatory Visit (HOSPITAL_BASED_OUTPATIENT_CLINIC_OR_DEPARTMENT_OTHER): Payer: Self-pay | Admitting: Orthopaedic Surgery

## 2021-05-23 ENCOUNTER — Encounter (HOSPITAL_BASED_OUTPATIENT_CLINIC_OR_DEPARTMENT_OTHER): Payer: Self-pay | Admitting: Physical Therapy

## 2021-05-23 ENCOUNTER — Other Ambulatory Visit: Payer: Self-pay

## 2021-05-23 ENCOUNTER — Ambulatory Visit (INDEPENDENT_AMBULATORY_CARE_PROVIDER_SITE_OTHER): Payer: BC Managed Care – PPO | Admitting: Orthopaedic Surgery

## 2021-05-23 VITALS — Ht 66.0 in | Wt 250.0 lb

## 2021-05-23 DIAGNOSIS — M25552 Pain in left hip: Secondary | ICD-10-CM | POA: Diagnosis present

## 2021-05-23 DIAGNOSIS — M6281 Muscle weakness (generalized): Secondary | ICD-10-CM

## 2021-05-23 DIAGNOSIS — S73192A Other sprain of left hip, initial encounter: Secondary | ICD-10-CM

## 2021-05-23 DIAGNOSIS — M25561 Pain in right knee: Secondary | ICD-10-CM

## 2021-05-23 DIAGNOSIS — M25852 Other specified joint disorders, left hip: Secondary | ICD-10-CM | POA: Diagnosis not present

## 2021-05-23 DIAGNOSIS — R262 Difficulty in walking, not elsewhere classified: Secondary | ICD-10-CM | POA: Diagnosis present

## 2021-05-23 MED ORDER — ACETAMINOPHEN 500 MG PO TABS: 500.0000 mg | ORAL_TABLET | Freq: Three times a day (TID) | ORAL | 0 refills | Status: AC

## 2021-05-23 MED ORDER — ASPIRIN EC 325 MG PO TBEC
325.0000 mg | DELAYED_RELEASE_TABLET | Freq: Every day | ORAL | 0 refills | Status: DC
Start: 2021-05-23 — End: 2021-06-23

## 2021-05-23 MED ORDER — OXYCODONE HCL 5 MG PO CAPS: 5.0000 mg | ORAL_CAPSULE | ORAL | 0 refills | Status: DC | PRN

## 2021-05-23 MED ORDER — IBUPROFEN 800 MG PO TABS: 800.0000 mg | ORAL_TABLET | Freq: Three times a day (TID) | ORAL | 0 refills | Status: AC

## 2021-05-23 NOTE — H&P (View-Only) (Signed)
Chief Complaint: Left hip and right knee pain     History of Present Illness:   05/23/2021: Linda Gill presents today for follow-up of her left hip.  She has been doing physical therapy which is somewhat helpful.  She has been doing this with [facility].  She states that she did have significant relief from the injection but this lasted only 1 day.  She is not able to specifically give a percentage of how much pain relief she had although she said that if this were to be a prolonged pain relief she would be very happy with her outcome.   Linda Gill is a 45 y.o. female presents today with left hip and right knee pain.  She states that she was recently diagnosed with rheumatoid arthritis over the last year at which time both the left hip and right knee pain have progressed.  She is previously been seen by Dr. Alphonzo Severance who referred her for possible evaluation of hip arthroscopy.  She is overall been very frustrated last several months as she states that she has gained some weight which is prohibiting her from being active in addition to her significant pain in the hip and right knee.  She usually enjoys being very active and previously has done CrossFit which she is no longer able to complete because of her pain.  She has a great Dane which she enjoys walking but is only able to do this at the most 1/4 mile as this causes pain.  She also endorses that going uphill causes her pain in both her hip and her knee.  She has had 1 x-ray guided injection in the left hip several months prior although she states that this did not give her any relief whatsoever.  She has not completed physical therapy.    Surgical History:   None  PMH/PSH/Family History/Social History/Meds/Allergies:    Past Medical History:  Diagnosis Date   Arthritis    No past surgical history on file. Social History   Socioeconomic History   Marital status: Unknown    Spouse name: Not on file    Number of children: Not on file   Years of education: Not on file   Highest education level: Not on file  Occupational History   Not on file  Tobacco Use   Smoking status: Never   Smokeless tobacco: Never  Substance and Sexual Activity   Alcohol use: Yes   Drug use: Never   Sexual activity: Not on file  Other Topics Concern   Not on file  Social History Narrative   Not on file   Social Determinants of Health   Financial Resource Strain: Not on file  Food Insecurity: Not on file  Transportation Needs: Not on file  Physical Activity: Not on file  Stress: Not on file  Social Connections: Not on file   No family history on file. Allergies  Allergen Reactions   Guaifenesin Hives   Famotidine Rash   Moxifloxacin Rash   Penicillins Rash   Shellfish Allergy Rash   Sulfa Antibiotics Rash   Current Outpatient Medications  Medication Sig Dispense Refill   Adalimumab (HUMIRA PEN) 40 MG/0.4ML PNKT Humira(CF) Pen 40 mg/0.4 mL subcutaneous kit     calcium-vitamin D (OSCAL WITH D) 500-200 MG-UNIT TABS tablet Take 1 tablet by mouth  daily.     Cholecalciferol (VITAMIN D3) 1.25 MG (50000 UT) CAPS Vitamin D3     Cholecalciferol 25 MCG (1000 UT) tablet Take by mouth.     ciprofloxacin (CIPRO) 500 MG tablet ciprofloxacin 500 mg tablet     FLUoxetine (PROZAC) 20 MG capsule fluoxetine 20 mg capsule     fluticasone (FLONASE) 50 MCG/ACT nasal spray 2 sprays by Each Nare route daily.     folic acid (FOLVITE) 1 MG tablet folic acid 1 mg tablet     loratadine (CLARITIN) 10 MG tablet Take by mouth.     meloxicam (MOBIC) 15 MG tablet meloxicam 15 mg tablet     methotrexate (RHEUMATREX) 2.5 MG tablet Take 15 mg by mouth once a week.     montelukast (SINGULAIR) 10 MG tablet montelukast 10 mg tablet     Multiple Vitamin (MULTI-VITAMINS) TABS Take by mouth.     norethindrone-ethinyl estradiol (LOESTRIN) 1-20 MG-MCG tablet Take 1 tablet by mouth daily.     Omega-3 Fatty Acids (FISH OIL) 500 MG CAPS  Take 1 capsule by mouth daily.     omeprazole (PRILOSEC) 40 MG capsule Take 1 capsule by mouth daily.     TURMERIC PO Take by mouth.     vitamin B-12 (CYANOCOBALAMIN) 100 MCG tablet Vitamin B12     vitamin B-12 (CYANOCOBALAMIN) 500 MCG tablet Take by mouth.     No current facility-administered medications for this visit.   No results found.  Review of Systems:   A ROS was performed including pertinent positives and negatives as documented in the HPI.  Physical Exam :   Constitutional: NAD and appears stated age Neurological: Alert and oriented Psych: Appropriate affect and cooperative Height _0  (1.676 m), weight 250 lb (113.4 kg).   Comprehensive Musculoskeletal Exam:    Inspection Right Left  Skin No atrophy or gross abnormalities appreciated No atrophy or gross abnormalities appreciated  Palpation    Tenderness None None  Crepitus None None  Range of Motion    Flexion (passive) 120 120  Extension 30 30  IR 30 no pain 20 with pain  ER 40 40  Strength    Flexion  5/5 5/5  Extension 5/5 5/5  Special Tests    FABIR Negative Negative  FADER Negative Negative  ER Lag/Capsular Insufficiency Negative Negative  Instability Negative Negative  Sacroiliac pain Negative  Negative   Instability    Generalized Laxity No No  Neurologic    sciatic, femoral, obturator nerves intact to light sensation  Vascular/Lymphatic    DP pulse 2+ 2+  Lumbar Exam    Patient has symmetric lumbar range of motion with negative pain referral to hip  +FADIR left hip    Musculoskeletal Exam  Gait Normal  Alignment Normal   Right Left  Inspection Normal Normal  Palpation    Tenderness Patellofemoral No  Crepitus None None  Effusion None None  Range of Motion    Extension -3 -3  Flexion 130 130  Strength    Extension 5/5 5/5  Flexion 5/5 5/5  Ligament Exam     Generalized Laxity No No  Lachman Negative Negative   Pivot Shift Negative Negative  Anterior Drawer Negative Negative   Valgus at 0 Negative Negative  Valgus at 20 Negative Negative  Varus at 0 0 0  Varus at 20   0 0  Posterior Drawer at 90 0 0  Vascular/Lymphatic Exam    Edema None None  Venous Stasis Changes No No  Distal Circulation Normal Normal  Neurologic    Light Touch Sensation Intact Intact  Special Tests:       Imaging:   Xray (3 views left hip and 3 views right knee): She has a concentric left hip.  There is a cam lesion present but no pincer.  MRI (left hip and right knee): Left hip shows a focal tear in the labrum as well as right knee MRI shows lateral patellar tilt with predominantly patellofemoral cartilage loss which is moderate  I personally reviewed and interpreted the radiographs.   Assessment:   44 year old female with left hip pain consistent with torn labrum.  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  I have described that ultimately given the fact that she did experience pain relief from her injection and she does have MRI as well as x-ray findings of FAI that I believe left hip arthroscopy would be reasonable.  We described the this is unlikely to give her complete relief as she ultimately did not get 100% relief from the injection.  She is at the point where she would like to undergo any type of surgical intervention that would give her some type of longer lasting relief.  She has failed injections as well as physical therapy at this time.  Plan :    -Plan for left hip arthroscopy with labral repair, cam debridement -Preoperative medication sent to her pharmacy   After a lengthy discussion of treatment options, including risks, benefits, alternatives, complications of surgical and nonsurgical conservative options, the patient elected surgical repair.   The patient  is aware of the material risks  and complications including, but not limited to injury to adjacent structures, neurovascular injury, infection, numbness, bleeding, implant failure, thermal  burns, stiffness, persistent pain, failure to heal, disease transmission from allograft, need for further surgery, dislocation, anesthetic risks, blood clots, risks of death,and others. The probabilities of surgical success and failure discussed with patient given their particular co-morbidities.The time and nature of expected rehabilitation and recovery was discussed.The patient's questions were all answered preoperatively.  No barriers to understanding were noted. I explained the natural history of the disease process and Rx rationale.  I explained to the patient what I considered to be reasonable expectations given their personal situation.  The final treatment plan was arrived at through a shared patient decision making process model.   I personally saw and evaluated the patient, and participated in the management and treatment plan.  Vanetta Mulders, MD Attending Physician, Orthopedic Surgery    Procedure Note  Patient: Linda Gill             Date of Birth: [birth year]           MRN: [id number]             Visit Date: 05/23/2021  Procedures: Visit Diagnoses:  No diagnosis found.   No procedures performed    This document was dictated using Systems analyst. A reasonable attempt at proof reading has been made to minimize errors.

## 2021-05-23 NOTE — Progress Notes (Signed)
                               Chief Complaint: Left hip and right knee pain     History of Present Illness:   05/23/2021: Linda Gill presents today for follow-up of her left hip.  She has been doing physical therapy which is somewhat helpful.  She has been doing this with [facility].  She states that she did have significant relief from the injection but this lasted only 1 day.  She is not able to specifically give a percentage of how much pain relief she had although she said that if this were to be a prolonged pain relief she would be very happy with her outcome.   Linda Gill is a 45 y.o. female presents today with left hip and right knee pain.  She states that she was recently diagnosed with rheumatoid arthritis over the last year at which time both the left hip and right knee pain have progressed.  She is previously been seen by Dr. Scott Dean who referred her for possible evaluation of hip arthroscopy.  She is overall been very frustrated last several months as she states that she has gained some weight which is prohibiting her from being active in addition to her significant pain in the hip and right knee.  She usually enjoys being very active and previously has done CrossFit which she is no longer able to complete because of her pain.  She has a great Dane which she enjoys walking but is only able to do this at the most 1/4 mile as this causes pain.  She also endorses that going uphill causes her pain in both her hip and her knee.  She has had 1 x-ray guided injection in the left hip several months prior although she states that this did not give her any relief whatsoever.  She has not completed physical therapy.    Surgical History:   None  PMH/PSH/Family History/Social History/Meds/Allergies:    Past Medical History:  Diagnosis Date   Arthritis    No past surgical history on file. Social History   Socioeconomic History   Marital status: Unknown    Spouse name: Not on file    Number of children: Not on file   Years of education: Not on file   Highest education level: Not on file  Occupational History   Not on file  Tobacco Use   Smoking status: Never   Smokeless tobacco: Never  Substance and Sexual Activity   Alcohol use: Yes   Drug use: Never   Sexual activity: Not on file  Other Topics Concern   Not on file  Social History Narrative   Not on file   Social Determinants of Health   Financial Resource Strain: Not on file  Food Insecurity: Not on file  Transportation Needs: Not on file  Physical Activity: Not on file  Stress: Not on file  Social Connections: Not on file   No family history on file. Allergies  Allergen Reactions   Guaifenesin Hives   Famotidine Rash   Moxifloxacin Rash   Penicillins Rash   Shellfish Allergy Rash   Sulfa Antibiotics Rash   Current Outpatient Medications  Medication Sig Dispense Refill   Adalimumab (HUMIRA PEN) 40 MG/0.4ML PNKT Humira(CF) Pen 40 mg/0.4 mL subcutaneous kit     calcium-vitamin D (OSCAL WITH D) 500-200 MG-UNIT TABS tablet Take 1 tablet by mouth   daily.     Cholecalciferol (VITAMIN D3) 1.25 MG (50000 UT) CAPS Vitamin D3     Cholecalciferol 25 MCG (1000 UT) tablet Take by mouth.     ciprofloxacin (CIPRO) 500 MG tablet ciprofloxacin 500 mg tablet     FLUoxetine (PROZAC) 20 MG capsule fluoxetine 20 mg capsule     fluticasone (FLONASE) 50 MCG/ACT nasal spray 2 sprays by Each Nare route daily.     folic acid (FOLVITE) 1 MG tablet folic acid 1 mg tablet     loratadine (CLARITIN) 10 MG tablet Take by mouth.     meloxicam (MOBIC) 15 MG tablet meloxicam 15 mg tablet     methotrexate (RHEUMATREX) 2.5 MG tablet Take 15 mg by mouth once a week.     montelukast (SINGULAIR) 10 MG tablet montelukast 10 mg tablet     Multiple Vitamin (MULTI-VITAMINS) TABS Take by mouth.     norethindrone-ethinyl estradiol (LOESTRIN) 1-20 MG-MCG tablet Take 1 tablet by mouth daily.     Omega-3 Fatty Acids (FISH OIL) 500 MG CAPS  Take 1 capsule by mouth daily.     omeprazole (PRILOSEC) 40 MG capsule Take 1 capsule by mouth daily.     TURMERIC PO Take by mouth.     vitamin B-12 (CYANOCOBALAMIN) 100 MCG tablet Vitamin B12     vitamin B-12 (CYANOCOBALAMIN) 500 MCG tablet Take by mouth.     No current facility-administered medications for this visit.   No results found.  Review of Systems:   A ROS was performed including pertinent positives and negatives as documented in the HPI.  Physical Exam :   Constitutional: NAD and appears stated age Neurological: Alert and oriented Psych: Appropriate affect and cooperative Height 5' 6" (1.676 m), weight 250 lb (113.4 kg).   Comprehensive Musculoskeletal Exam:    Inspection Right Left  Skin No atrophy or gross abnormalities appreciated No atrophy or gross abnormalities appreciated  Palpation    Tenderness None None  Crepitus None None  Range of Motion    Flexion (passive) 120 120  Extension 30 30  IR 30 no pain 20 with pain  ER 40 40  Strength    Flexion  5/5 5/5  Extension 5/5 5/5  Special Tests    FABIR Negative Negative  FADER Negative Negative  ER Lag/Capsular Insufficiency Negative Negative  Instability Negative Negative  Sacroiliac pain Negative  Negative   Instability    Generalized Laxity No No  Neurologic    sciatic, femoral, obturator nerves intact to light sensation  Vascular/Lymphatic    DP pulse 2+ 2+  Lumbar Exam    Patient has symmetric lumbar range of motion with negative pain referral to hip  +FADIR left hip    Musculoskeletal Exam  Gait Normal  Alignment Normal   Right Left  Inspection Normal Normal  Palpation    Tenderness Patellofemoral No  Crepitus None None  Effusion None None  Range of Motion    Extension -3 -3  Flexion 130 130  Strength    Extension 5/5 5/5  Flexion 5/5 5/5  Ligament Exam     Generalized Laxity No No  Lachman Negative Negative   Pivot Shift Negative Negative  Anterior Drawer Negative Negative   Valgus at 0 Negative Negative  Valgus at 20 Negative Negative  Varus at 0 0 0  Varus at 20   0 0  Posterior Drawer at 90 0 0  Vascular/Lymphatic Exam    Edema None None  Venous Stasis Changes No No    Distal Circulation Normal Normal  Neurologic    Light Touch Sensation Intact Intact  Special Tests:       Imaging:   Xray (3 views left hip and 3 views right knee): She has a concentric left hip.  There is a cam lesion present but no pincer.  MRI (left hip and right knee): Left hip shows a focal tear in the labrum as well as right knee MRI shows lateral patellar tilt with predominantly patellofemoral cartilage loss which is moderate  I personally reviewed and interpreted the radiographs.   Assessment:   45-year-old female with left hip pain consistent with torn labrum.  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  I have described that ultimately given the fact that she did experience pain relief from her injection and she does have MRI as well as x-ray findings of FAI that I believe left hip arthroscopy would be reasonable.  We described the this is unlikely to give her complete relief as she ultimately did not get 100% relief from the injection.  She is at the point where she would like to undergo any type of surgical intervention that would give her some type of longer lasting relief.  She has failed injections as well as physical therapy at this time.  Plan :    -Plan for left hip arthroscopy with labral repair, cam debridement -Preoperative medication sent to her pharmacy   After a lengthy discussion of treatment options, including risks, benefits, alternatives, complications of surgical and nonsurgical conservative options, the patient elected surgical repair.   The patient  is aware of the material risks  and complications including, but not limited to injury to adjacent structures, neurovascular injury, infection, numbness, bleeding, implant failure, thermal  burns, stiffness, persistent pain, failure to heal, disease transmission from allograft, need for further surgery, dislocation, anesthetic risks, blood clots, risks of death,and others. The probabilities of surgical success and failure discussed with patient given their particular co-morbidities.The time and nature of expected rehabilitation and recovery was discussed.The patient's questions were all answered preoperatively.  No barriers to understanding were noted. I explained the natural history of the disease process and Rx rationale.  I explained to the patient what I considered to be reasonable expectations given their personal situation.  The final treatment plan was arrived at through a shared patient decision making process model.   I personally saw and evaluated the patient, and participated in the management and treatment plan.  Romar Woodrick, MD Attending Physician, Orthopedic Surgery    Procedure Note  Patient: Linda Gill             Date of Birth: 05/17/1976           MRN: [id number]             Visit Date: 05/23/2021  Procedures: Visit Diagnoses:  No diagnosis found.   No procedures performed    This document was dictated using Dragon voice recognition software. A reasonable attempt at proof reading has been made to minimize errors.  

## 2021-05-23 NOTE — H&P (View-Only) (Signed)
                               Chief Complaint: Left hip and right knee pain     History of Present Illness:   05/23/2021: Linda Gill presents today for follow-up of her left hip.  She has been doing physical therapy which is somewhat helpful.  She has been doing this with [facility].  She states that she did have significant relief from the injection but this lasted only 1 day.  She is not able to specifically give a percentage of how much pain relief she had although she said that if this were to be a prolonged pain relief she would be very happy with her outcome.   Linda Gill is a 45 y.o. female presents today with left hip and right knee pain.  She states that she was recently diagnosed with rheumatoid arthritis over the last year at which time both the left hip and right knee pain have progressed.  She is previously been seen by Dr. Scott Dean who referred her for possible evaluation of hip arthroscopy.  She is overall been very frustrated last several months as she states that she has gained some weight which is prohibiting her from being active in addition to her significant pain in the hip and right knee.  She usually enjoys being very active and previously has done CrossFit which she is no longer able to complete because of her pain.  She has a great Dane which she enjoys walking but is only able to do this at the most 1/4 mile as this causes pain.  She also endorses that going uphill causes her pain in both her hip and her knee.  She has had 1 x-ray guided injection in the left hip several months prior although she states that this did not give her any relief whatsoever.  She has not completed physical therapy.    Surgical History:   None  PMH/PSH/Family History/Social History/Meds/Allergies:    Past Medical History:  Diagnosis Date   Arthritis    No past surgical history on file. Social History   Socioeconomic History   Marital status: Unknown    Spouse name: Not on file    Number of children: Not on file   Years of education: Not on file   Highest education level: Not on file  Occupational History   Not on file  Tobacco Use   Smoking status: Never   Smokeless tobacco: Never  Substance and Sexual Activity   Alcohol use: Yes   Drug use: Never   Sexual activity: Not on file  Other Topics Concern   Not on file  Social History Narrative   Not on file   Social Determinants of Health   Financial Resource Strain: Not on file  Food Insecurity: Not on file  Transportation Needs: Not on file  Physical Activity: Not on file  Stress: Not on file  Social Connections: Not on file   No family history on file. Allergies  Allergen Reactions   Guaifenesin Hives   Famotidine Rash   Moxifloxacin Rash   Penicillins Rash   Shellfish Allergy Rash   Sulfa Antibiotics Rash   Current Outpatient Medications  Medication Sig Dispense Refill   Adalimumab (HUMIRA PEN) 40 MG/0.4ML PNKT Humira(CF) Pen 40 mg/0.4 mL subcutaneous kit     calcium-vitamin D (OSCAL WITH D) 500-200 MG-UNIT TABS tablet Take 1 tablet by mouth   daily.     Cholecalciferol (VITAMIN D3) 1.25 MG (50000 UT) CAPS Vitamin D3     Cholecalciferol 25 MCG (1000 UT) tablet Take by mouth.     ciprofloxacin (CIPRO) 500 MG tablet ciprofloxacin 500 mg tablet     FLUoxetine (PROZAC) 20 MG capsule fluoxetine 20 mg capsule     fluticasone (FLONASE) 50 MCG/ACT nasal spray 2 sprays by Each Nare route daily.     folic acid (FOLVITE) 1 MG tablet folic acid 1 mg tablet     loratadine (CLARITIN) 10 MG tablet Take by mouth.     meloxicam (MOBIC) 15 MG tablet meloxicam 15 mg tablet     methotrexate (RHEUMATREX) 2.5 MG tablet Take 15 mg by mouth once a week.     montelukast (SINGULAIR) 10 MG tablet montelukast 10 mg tablet     Multiple Vitamin (MULTI-VITAMINS) TABS Take by mouth.     norethindrone-ethinyl estradiol (LOESTRIN) 1-20 MG-MCG tablet Take 1 tablet by mouth daily.     Omega-3 Fatty Acids (FISH OIL) 500 MG CAPS  Take 1 capsule by mouth daily.     omeprazole (PRILOSEC) 40 MG capsule Take 1 capsule by mouth daily.     TURMERIC PO Take by mouth.     vitamin B-12 (CYANOCOBALAMIN) 100 MCG tablet Vitamin B12     vitamin B-12 (CYANOCOBALAMIN) 500 MCG tablet Take by mouth.     No current facility-administered medications for this visit.   No results found.  Review of Systems:   A ROS was performed including pertinent positives and negatives as documented in the HPI.  Physical Exam :   Constitutional: NAD and appears stated age Neurological: Alert and oriented Psych: Appropriate affect and cooperative Height 5' 6" (1.676 m), weight 250 lb (113.4 kg).   Comprehensive Musculoskeletal Exam:    Inspection Right Left  Skin No atrophy or gross abnormalities appreciated No atrophy or gross abnormalities appreciated  Palpation    Tenderness None None  Crepitus None None  Range of Motion    Flexion (passive) 120 120  Extension 30 30  IR 30 no pain 20 with pain  ER 40 40  Strength    Flexion  5/5 5/5  Extension 5/5 5/5  Special Tests    FABIR Negative Negative  FADER Negative Negative  ER Lag/Capsular Insufficiency Negative Negative  Instability Negative Negative  Sacroiliac pain Negative  Negative   Instability    Generalized Laxity No No  Neurologic    sciatic, femoral, obturator nerves intact to light sensation  Vascular/Lymphatic    DP pulse 2+ 2+  Lumbar Exam    Patient has symmetric lumbar range of motion with negative pain referral to hip  +FADIR left hip    Musculoskeletal Exam  Gait Normal  Alignment Normal   Right Left  Inspection Normal Normal  Palpation    Tenderness Patellofemoral No  Crepitus None None  Effusion None None  Range of Motion    Extension -3 -3  Flexion 130 130  Strength    Extension 5/5 5/5  Flexion 5/5 5/5  Ligament Exam     Generalized Laxity No No  Lachman Negative Negative   Pivot Shift Negative Negative  Anterior Drawer Negative Negative   Valgus at 0 Negative Negative  Valgus at 20 Negative Negative  Varus at 0 0 0  Varus at 20   0 0  Posterior Drawer at 90 0 0  Vascular/Lymphatic Exam    Edema None None  Venous Stasis Changes No No    Distal Circulation Normal Normal  Neurologic    Light Touch Sensation Intact Intact  Special Tests:       Imaging:   Xray (3 views left hip and 3 views right knee): She has a concentric left hip.  There is a cam lesion present but no pincer.  MRI (left hip and right knee): Left hip shows a focal tear in the labrum as well as right knee MRI shows lateral patellar tilt with predominantly patellofemoral cartilage loss which is moderate  I personally reviewed and interpreted the radiographs.   Assessment:   45-year-old female with left hip pain consistent with torn labrum.  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  I have described that ultimately given the fact that she did experience pain relief from her injection and she does have MRI as well as x-ray findings of FAI that I believe left hip arthroscopy would be reasonable.  We described the this is unlikely to give her complete relief as she ultimately did not get 100% relief from the injection.  She is at the point where she would like to undergo any type of surgical intervention that would give her some type of longer lasting relief.  She has failed injections as well as physical therapy at this time.  Plan :    -Plan for left hip arthroscopy with labral repair, cam debridement -Preoperative medication sent to her pharmacy   After a lengthy discussion of treatment options, including risks, benefits, alternatives, complications of surgical and nonsurgical conservative options, the patient elected surgical repair.   The patient  is aware of the material risks  and complications including, but not limited to injury to adjacent structures, neurovascular injury, infection, numbness, bleeding, implant failure, thermal  burns, stiffness, persistent pain, failure to heal, disease transmission from allograft, need for further surgery, dislocation, anesthetic risks, blood clots, risks of death,and others. The probabilities of surgical success and failure discussed with patient given their particular co-morbidities.The time and nature of expected rehabilitation and recovery was discussed.The patient's questions were all answered preoperatively.  No barriers to understanding were noted. I explained the natural history of the disease process and Rx rationale.  I explained to the patient what I considered to be reasonable expectations given their personal situation.  The final treatment plan was arrived at through a shared patient decision making process model.   I personally saw and evaluated the patient, and participated in the management and treatment plan.  Dantrell Schertzer, MD Attending Physician, Orthopedic Surgery    Procedure Note  Patient: Caryle B Symonette             Date of Birth: 05/18/1976           MRN: [id number]             Visit Date: 05/23/2021  Procedures: Visit Diagnoses:  No diagnosis found.   No procedures performed    This document was dictated using Dragon voice recognition software. A reasonable attempt at proof reading has been made to minimize errors.  

## 2021-05-23 NOTE — Therapy (Signed)
OUTPATIENT PHYSICAL THERAPY TREATMENT NOTE   Patient Name: Linda Gill MRN: [id number] DOB:[birth year], 45 y.o., female Today's Date: 05/23/2021  PCP: Tracey Harries, MD REFERRING PROVIDER: Tracey Harries, MD   PT End of Session - 05/23/21 0945     Visit Number 4    Number of Visits 13    Authorization Type BCBS    PT Start Time 0930    PT Stop Time 1000    PT Time Calculation (min) 30 min    Activity Tolerance Patient tolerated treatment well;Patient limited by pain    Behavior During Therapy [facility] for tasks assessed/performed               Past Medical History:  Diagnosis Date   Arthritis    History reviewed. No pertinent surgical history. There are no problems to display for this patient.   REFERRING DIAG: ONSET DATE: 2021   REFERRING DIAG: M25.552 (ICD-10-CM) - Left hip pain M17.11 (ICD-10-CM) - Patellofemoral arthritis of right knee    THERAPY DIAG:  Pain in joint of right knee   Pain in left hip   Muscle weakness (generalized)   Difficulty walking   SUBJECTIVE:    SUBJECTIVE STATEMENT:  Pt states she was really sore after last session but not more than usual. She did feel an ache into the leg or the hip that is "normal." She states she has had limited ability to do HEP due to walking pneumonia. Has been on antibiotics 2 days.                                                                                                                                                                                              PERTINENT HISTORY: RA, L labral tear   PAIN:  Are you having pain? Yes VAS scale: 4/10 in the hip Pain location: R knee, L hip  Pain orientation: Right and Left  PAIN TYPE: throbbing, aching Pain description: constant  Aggravating factors: squat, working out, walking for too long, sitting for too long, sleeping on L side, mowing the lawn Relieving factors: resting     THERAPY DIAG:  Pain in joint of right knee  Pain in left  hip  Muscle weakness (generalized)  Difficulty walking  PERTINENT HISTORY: PERTINENT HISTORY: RA, L labral tear   PRECAUTIONS: None    OBJECTIVE:   SPO2  98%, 91 HR. BP seated 140/100 (post exercise)  TODAY'S TREATMENT: 10/4 L1-5 CPA grade IV, L L1-5 UPA grade IV  STS 2x10 warmup without weight Bosu squat 10x Seated leg press with alternating staggered stance 2x15 115lbs Seated knee extension 90 to  60, 2x10 3s hold at top  09/28:  L1-5 CPA grade IV, L L1-5 UPA grade IV STM- L QL, paraspinals, and L hip   Bosu squat 2x10 Seated leg press with alternating staggered stance 2x15 115lbs 16kg KB Box squat- mid thigh height blue TB around knees 3x10  RDL 16kg KB 2x12     PATIENT EDUCATION:  Education details: anatomy, exercise progression, DOMS expectations, recovery times, pre-surgical strengthening, RPE based strength training Person educated: Patient Education method: Explanation, Demonstration, Tactile cues, Verbal cues, and Handouts Education comprehension: verbalized understanding, returned demonstration, verbal cues required, and tactile cues required     HOME EXERCISE PROGRAM:   Access Code: [email or web address]      ASSESSMENT:   CLINICAL IMPRESSION: Pt session curtailed due to difficulty with breathing following pneumonia recovery. Pt able to perform half session but reports difficulty with catching her breath during exertion.  Pt given extended rest break and was able to recovery within 5-10 mins of standing and sitting rest. Pt plans to have labral repair surgery within the next 1-1.5 weeks with MD. Plan to hold PT until surgery and return for post op rehab as per MD. Pt would benefit from continued skilled therapy in order to reach goals and maximize functional bilat LE  strength and ROM for full return to PLOF.   REHAB POTENTIAL: Good   CLINICAL DECISION MAKING: Stable/uncomplicated   EVALUATION COMPLEXITY: Low     GOALS:     SHORT TERM GOALS:   STG Name  Target Date Goal status  1 Pt will become independent with HEP in order to demonstrate synthesis of PT education.   05/17/2021 INITIAL  2 Pt will be able to demonstrate ability to perform STS/squat to parallel depth and no UE assist in order to demonstrate functional improvement in bilat LE function for self-care and house hold duties.   05/31/2021 INITIAL  3 Pt will be able to demonstrate ability to perform stairs and gait with at least neutral hip extension and equal WB in order to demonstrate functional improvement in bilat LE function for self-care and house hold duties.     05/31/2021 INITIAL  4 Pt will have an at least 9 pt improvement in LEFS measure in order to demonstrate MCID improvement in daily function.   05/31/2021 INITIAL    LONG TERM GOALS:    LTG Name Target Date Goal status  1 Pt  will become independent with final HEP in order to demonstrate synthesis of PT education.   06/28/2021 INITIAL  2 Pt will be able to demonstrate/report ability to sit/stand/sleep for extended periods of time without pain in order to demonstrate functional improvement and tolerance to static positioning.   06/28/2021 INITIAL  3 Pt will be able to demonstrate DL squat with >/=01 lbs in order to demonstrate functional improvement in bilat LE strength for return to PLOF and exercise.   06/28/2021 INITIAL  4 Pt will have an at least 18 pt improvement in LEFS measure in order to demonstrate MCID improvement in daily function.     06/28/2021 INITIAL    PLAN: PT FREQUENCY: 1-2x/week   PT DURATION: 8 weeks   PLANNED INTERVENTIONS: Therapeutic exercises, Therapeutic activity, Neuro Muscular re-education, Balance training, Gait training, Patient/Family education, Joint mobilization, Stair training, Orthotic/Fit training, Aquatic Therapy, Dry Needling, Electrical stimulation, Spinal mobilization, Cryotherapy, Moist heat, scar mobilization, Taping, Vasopneumatic device, Ultrasound, Ionotophoresis 4mg /ml  Dexamethasone, and Manual therapy   PLAN FOR NEXT SESSION: strength training review if able. Otherwise, plan to  resume PT following labral repair.   Zebedee Iba PT, DPT 05/23/21 10:12 AM     [facility] [address] [place], [place], [address] Phone: [phone]   Fax:  [phone]  Patient name: Linda Gill MRN: [id number] DOB: February 18, 1976

## 2021-05-25 ENCOUNTER — Other Ambulatory Visit (HOSPITAL_BASED_OUTPATIENT_CLINIC_OR_DEPARTMENT_OTHER): Payer: Self-pay | Admitting: Orthopaedic Surgery

## 2021-05-25 MED ORDER — OXYCODONE HCL 5 MG PO TABS: 5.0000 mg | ORAL_TABLET | ORAL | 0 refills | Status: DC | PRN

## 2021-05-25 NOTE — Addendum Note (Signed)
Addended by: Benancio Deeds on: 05/25/2021 04:43 PM   Modules accepted: Orders

## 2021-05-26 ENCOUNTER — Ambulatory Visit (HOSPITAL_BASED_OUTPATIENT_CLINIC_OR_DEPARTMENT_OTHER): Payer: BC Managed Care – PPO | Admitting: Physical Therapy

## 2021-05-29 ENCOUNTER — Telehealth: Payer: Self-pay | Admitting: Orthopaedic Surgery

## 2021-05-29 NOTE — Telephone Encounter (Signed)
Spoke to patient about custom brace needing to be ordered after surgery is scheduled

## 2021-05-29 NOTE — Telephone Encounter (Signed)
Pt states she is suppose to be getting fitted for a brace but has not heard from anyone. Would like a call back.   CB [phone]

## 2021-05-30 ENCOUNTER — Ambulatory Visit (HOSPITAL_BASED_OUTPATIENT_CLINIC_OR_DEPARTMENT_OTHER): Payer: BC Managed Care – PPO | Admitting: Physical Therapy

## 2021-05-30 ENCOUNTER — Encounter (HOSPITAL_BASED_OUTPATIENT_CLINIC_OR_DEPARTMENT_OTHER): Payer: Self-pay | Admitting: Physical Therapy

## 2021-05-30 ENCOUNTER — Other Ambulatory Visit: Payer: Self-pay

## 2021-05-30 DIAGNOSIS — M25561 Pain in right knee: Secondary | ICD-10-CM | POA: Diagnosis not present

## 2021-05-30 DIAGNOSIS — M25552 Pain in left hip: Secondary | ICD-10-CM

## 2021-05-30 DIAGNOSIS — R262 Difficulty in walking, not elsewhere classified: Secondary | ICD-10-CM

## 2021-05-30 DIAGNOSIS — M6281 Muscle weakness (generalized): Secondary | ICD-10-CM

## 2021-05-30 NOTE — Therapy (Signed)
OUTPATIENT PHYSICAL THERAPY TREATMENT NOTE   Patient Name: [name] MRN: [id number] DOB:1976/05/03, 45 y.o., female Today's Date: 05/30/2021  PCP: Tracey Harries, MD REFERRING PROVIDER: Tracey Harries, MD   PT End of Session - 05/30/21 0805     Visit Number 5    Number of Visits 13    Authorization Type BCBS    PT Start Time 0800    PT Stop Time 0840    PT Time Calculation (min) 40 min    Activity Tolerance Patient tolerated treatment well;Patient limited by pain    Behavior During Therapy [facility] for tasks assessed/performed               Past Medical History:  Diagnosis Date   Arthritis    History reviewed. No pertinent surgical history. There are no problems to display for this patient.   REFERRING DIAG: ONSET DATE: 2021   REFERRING DIAG: M25.552 (ICD-10-CM) - Left hip pain M17.11 (ICD-10-CM) - Patellofemoral arthritis of right knee    THERAPY DIAG:  Pain in joint of right knee   Pain in left hip   Muscle weakness (generalized)   Difficulty walking   SUBJECTIVE:    SUBJECTIVE STATEMENT:  Pt states she feeling better from the pneumonia.  She states the knee is "killing me today."                                                                               PERTINENT HISTORY: RA, L labral tear   PAIN:  Are you having pain? Yes VAS scale: 8/10 in the knee, 6/10 hip Pain location: R knee, L hip  Pain orientation: Right and Left  PAIN TYPE: throbbing, aching Pain description: constant  Aggravating factors: squat, working out, walking for too long, sitting for too long, sleeping on L side, mowing the lawn Relieving factors: resting     THERAPY DIAG:  Pain in joint of right knee  Pain in left hip  Muscle weakness (generalized)  Difficulty walking  PERTINENT HISTORY: PERTINENT HISTORY: RA, L labral tear   PRECAUTIONS: None    OBJECTIVE:    TODAY'S TREATMENT: 10/11 TPDN to R rec fem, VL with skilled palpation and monitoring of soft  tissue response throughout session -LTR elicited with palpable tissue length increase -Verbal consent given after verbal edu provided   STM R quad after DN STS 3x10 box squat 15lb KB Bosu squat 10x Seated leg press with alternating staggered stance 3x10 145lbs white machine  Side stepping blue TB at knees 2x 34ft (painful, stopped) SL RDL with UE support 2x10 10/4 L1-5 CPA grade IV, L L1-5 UPA grade IV  STS 2x10 warmup without weight Bosu squat 10x Seated leg press with alternating staggered stance 2x15 115lbs Seated knee extension 90 to 60, 2x10 3s hold at top 09/28:  L1-5 CPA grade IV, L L1-5 UPA grade IV STM- L QL, paraspinals, and L hip   Bosu squat 2x10 Seated leg press with alternating staggered stance 2x15 115lbs 16kg KB Box squat- mid thigh height blue TB around knees 3x10  RDL 16kg KB 2x12     PATIENT EDUCATION:  Education details: anatomy, exercise progression, DOMS expectations, recovery times, pre-surgical strengthening, RPE  based strength training, DN aftercare/ recovery Person educated: Patient Education method: Explanation, Demonstration, Tactile cues, Verbal cues, and Handouts Education comprehension: verbalized understanding, returned demonstration, verbal cues required, and tactile cues required     HOME EXERCISE PROGRAM:   Access Code: [email or web address]      ASSESSMENT:   CLINICAL IMPRESSION: Pt with very good response to TPDN on R quad following complaint of increased R knee pain. Pt with significant reduction in muscle tension and drop from 8/10 to 1/0 pain following. Pt able to continue with strength exercise for R knee and L hip with good tolerance to CKC and OKC loading. Sidestepping was held due to signficant increase in L hip pain. Plan to continue with skilled therapy until surgery to maximize strength.Pt would benefit from continued skilled therapy in order to reach goals and maximize functional bilat LE  strength and ROM for full return to PLOF.    REHAB POTENTIAL: Good   CLINICAL DECISION MAKING: Stable/uncomplicated   EVALUATION COMPLEXITY: Low     GOALS:     SHORT TERM GOALS:   STG Name Target Date Goal status  1 Pt will become independent with HEP in order to demonstrate synthesis of PT education.   05/17/2021 INITIAL  2 Pt will be able to demonstrate ability to perform STS/squat to parallel depth and no UE assist in order to demonstrate functional improvement in bilat LE function for self-care and house hold duties.   05/31/2021 INITIAL  3 Pt will be able to demonstrate ability to perform stairs and gait with at least neutral hip extension and equal WB in order to demonstrate functional improvement in bilat LE function for self-care and house hold duties.     05/31/2021 INITIAL  4 Pt will have an at least 9 pt improvement in LEFS measure in order to demonstrate MCID improvement in daily function.   05/31/2021 INITIAL    LONG TERM GOALS:    LTG Name Target Date Goal status  1 Pt  will become independent with final HEP in order to demonstrate synthesis of PT education.   06/28/2021 INITIAL  2 Pt will be able to demonstrate/report ability to sit/stand/sleep for extended periods of time without pain in order to demonstrate functional improvement and tolerance to static positioning.   06/28/2021 INITIAL  3 Pt will be able to demonstrate DL squat with >/=60 lbs in order to demonstrate functional improvement in bilat LE strength for return to PLOF and exercise.   06/28/2021 INITIAL  4 Pt will have an at least 18 pt improvement in LEFS measure in order to demonstrate MCID improvement in daily function.     06/28/2021 INITIAL    PLAN: PT FREQUENCY: 1-2x/week   PT DURATION: 8 weeks   PLANNED INTERVENTIONS: Therapeutic exercises, Therapeutic activity, Neuro Muscular re-education, Balance training, Gait training, Patient/Family education, Joint mobilization, Stair training, Orthotic/Fit training, Aquatic Therapy, Dry Needling,  Electrical stimulation, Spinal mobilization, Cryotherapy, Moist heat, scar mobilization, Taping, Vasopneumatic device, Ultrasound, Ionotophoresis 4mg /ml Dexamethasone, and Manual therapy   PLAN FOR NEXT SESSION: continue with strength until repair/surgery   PT, DPT 05/30/21 9:37 AM     [facility] [address] [place], [place], [place] Phone: [phone]   Fax:  [phone]  Patient name: Linda Gill MRN: Mariane Masters DOB: [birth year]

## 2021-06-01 ENCOUNTER — Encounter (HOSPITAL_COMMUNITY): Payer: Self-pay | Admitting: Orthopaedic Surgery

## 2021-06-01 ENCOUNTER — Other Ambulatory Visit: Payer: Self-pay

## 2021-06-01 NOTE — Progress Notes (Addendum)
Linda Gill denies chest pain or shortness of breath. Patient denies having any s/s of Covid in her household.  Patient denies any known exposure to Covid.  PCP is Dr. Tracey Harries. Patient has never seen a cardiologist.  I instructed Linda Gill to shower with antibiotic soap, if it is available.  Dry off with a clean towel. Do not put lotion, powder, cologne or deodorant or makeup.No jewelry or piercings. Men may shave their face and neck. Woman should not shave. No nail polish, artificial or acrylic nails. Wear clean clothes, brush your teeth. Glasses, contact lens,dentures or partials may not be worn in the OR. If you need to wear them, please bring a case for glasses, do not wear contacts or bring a case, the hospital does not have contact cases, dentures or partials will have to be removed , make sure they are clean, we will provide a denture cup to put them in. You will need some one to drive you home and a responsible person over the age of 48 to stay with you for the first 24 hours after surgery.

## 2021-06-02 ENCOUNTER — Ambulatory Visit (HOSPITAL_COMMUNITY): Payer: BC Managed Care – PPO | Admitting: Anesthesiology

## 2021-06-02 ENCOUNTER — Ambulatory Visit (HOSPITAL_COMMUNITY)
Admission: RE | Admit: 2021-06-02 | Discharge: 2021-06-02 | Disposition: A | Discharge status: Home / Self Care | Payer: BC Managed Care – PPO | Source: Ambulatory Visit | Attending: Orthopaedic Surgery | Admitting: Orthopaedic Surgery

## 2021-06-02 ENCOUNTER — Encounter (HOSPITAL_BASED_OUTPATIENT_CLINIC_OR_DEPARTMENT_OTHER): Payer: BC Managed Care – PPO | Admitting: Physical Therapy

## 2021-06-02 ENCOUNTER — Encounter (HOSPITAL_COMMUNITY)
Admission: RE | Disposition: A | Discharge status: Home / Self Care | Payer: Self-pay | Source: Ambulatory Visit | Attending: Orthopaedic Surgery

## 2021-06-02 ENCOUNTER — Encounter (HOSPITAL_COMMUNITY): Payer: Self-pay | Admitting: Orthopaedic Surgery

## 2021-06-02 ENCOUNTER — Other Ambulatory Visit: Payer: Self-pay

## 2021-06-02 DIAGNOSIS — Z539 Procedure and treatment not carried out, unspecified reason: Secondary | ICD-10-CM | POA: Diagnosis not present

## 2021-06-02 DIAGNOSIS — Z882 Allergy status to sulfonamides status: Secondary | ICD-10-CM | POA: Diagnosis not present

## 2021-06-02 DIAGNOSIS — Z791 Long term (current) use of non-steroidal anti-inflammatories (NSAID): Secondary | ICD-10-CM | POA: Insufficient documentation

## 2021-06-02 DIAGNOSIS — Z79899 Other long term (current) drug therapy: Secondary | ICD-10-CM | POA: Insufficient documentation

## 2021-06-02 DIAGNOSIS — M069 Rheumatoid arthritis, unspecified: Secondary | ICD-10-CM | POA: Diagnosis not present

## 2021-06-02 DIAGNOSIS — Z793 Long term (current) use of hormonal contraceptives: Secondary | ICD-10-CM | POA: Diagnosis not present

## 2021-06-02 DIAGNOSIS — M25852 Other specified joint disorders, left hip: Secondary | ICD-10-CM | POA: Insufficient documentation

## 2021-06-02 DIAGNOSIS — X58XXXA Exposure to other specified factors, initial encounter: Secondary | ICD-10-CM | POA: Diagnosis not present

## 2021-06-02 DIAGNOSIS — M25552 Pain in left hip: Secondary | ICD-10-CM | POA: Diagnosis present

## 2021-06-02 DIAGNOSIS — S73199A Other sprain of unspecified hip, initial encounter: Secondary | ICD-10-CM

## 2021-06-02 DIAGNOSIS — S73192A Other sprain of left hip, initial encounter: Secondary | ICD-10-CM | POA: Insufficient documentation

## 2021-06-02 DIAGNOSIS — Z880 Allergy status to penicillin: Secondary | ICD-10-CM | POA: Diagnosis not present

## 2021-06-02 DIAGNOSIS — M25561 Pain in right knee: Secondary | ICD-10-CM | POA: Diagnosis present

## 2021-06-02 HISTORY — DX: Depression, unspecified: F32.A

## 2021-06-02 LAB — POCT PREGNANCY, URINE: Preg Test, Ur: NEGATIVE

## 2021-06-02 SURGERY — ARTHROSCOPY HIP
Anesthesia: Monitor Anesthesia Care | Site: Hip | Laterality: Left

## 2021-06-02 MED ORDER — CHLORHEXIDINE GLUCONATE 0.12 % MT SOLN
OROMUCOSAL | Status: AC
  Administered 2021-06-02: 15 mL
  Filled 2021-06-02: qty 15

## 2021-06-02 MED ORDER — BUPIVACAINE HCL (PF) 0.25 % IJ SOLN
INTRAMUSCULAR | Status: AC
  Filled 2021-06-02: qty 30

## 2021-06-02 MED ORDER — LACTATED RINGERS IV SOLN: INTRAVENOUS | Status: DC

## 2021-06-02 MED ORDER — TRANEXAMIC ACID-NACL 1000-0.7 MG/100ML-% IV SOLN
1000.0000 mg | INTRAVENOUS | Status: DC
  Filled 2021-06-02: qty 100

## 2021-06-02 MED ORDER — GABAPENTIN 300 MG PO CAPS
300.0000 mg | ORAL_CAPSULE | Freq: Once | ORAL | Status: AC
  Administered 2021-06-02: 300 mg via ORAL
  Filled 2021-06-02: qty 1

## 2021-06-02 MED ORDER — PROPOFOL 10 MG/ML IV BOLUS
INTRAVENOUS | Status: AC
  Filled 2021-06-02: qty 20

## 2021-06-02 MED ORDER — ORAL CARE MOUTH RINSE: 15.0000 mL | Freq: Once | OROMUCOSAL | Status: DC

## 2021-06-02 MED ORDER — CHLORHEXIDINE GLUCONATE 0.12 % MT SOLN: 15.0000 mL | Freq: Once | OROMUCOSAL | Status: DC

## 2021-06-02 MED ORDER — ACETAMINOPHEN 500 MG PO TABS
1000.0000 mg | ORAL_TABLET | Freq: Once | ORAL | Status: AC
  Administered 2021-06-02: 1000 mg via ORAL
  Filled 2021-06-02: qty 2

## 2021-06-02 MED ORDER — DEXAMETHASONE SODIUM PHOSPHATE 10 MG/ML IJ SOLN
INTRAMUSCULAR | Status: AC
  Filled 2021-06-02: qty 1

## 2021-06-02 MED ORDER — MIDAZOLAM HCL 2 MG/2ML IJ SOLN
INTRAMUSCULAR | Status: AC
  Filled 2021-06-02: qty 2

## 2021-06-02 MED ORDER — FENTANYL CITRATE (PF) 250 MCG/5ML IJ SOLN
INTRAMUSCULAR | Status: AC
  Filled 2021-06-02: qty 5

## 2021-06-02 MED ORDER — ACETAMINOPHEN 500 MG PO TABS: 1000.0000 mg | ORAL_TABLET | Freq: Once | ORAL | Status: DC

## 2021-06-02 MED ORDER — LIDOCAINE 2% (20 MG/ML) 5 ML SYRINGE
INTRAMUSCULAR | Status: AC
  Filled 2021-06-02: qty 5

## 2021-06-02 MED ORDER — CEFAZOLIN SODIUM-DEXTROSE 2-4 GM/100ML-% IV SOLN
2.0000 g | INTRAVENOUS | Status: DC
  Filled 2021-06-02: qty 100

## 2021-06-02 MED ORDER — ONDANSETRON HCL 4 MG/2ML IJ SOLN
INTRAMUSCULAR | Status: AC
  Filled 2021-06-02: qty 2

## 2021-06-02 NOTE — Progress Notes (Signed)
Called to confirm patient's arrival time on Monday 17th is 9am for her rescheduled surgery at 11am.

## 2021-06-02 NOTE — Progress Notes (Signed)
Discharging patient home. Unable to have surgery due to necessary equipment not available. Patient had IV d/c'd and walked out to the waiting area where her ride is awaiting.

## 2021-06-02 NOTE — Interval H&P Note (Signed)
History and Physical Interval Note:  06/02/2021 2:32 PM  Linda Gill  has presented today for surgery, with the diagnosis of left hip labral tear/impingement.  The various methods of treatment have been discussed with the patient and family. After consideration of risks, benefits and other options for treatment, the patient has consented to  Procedure(s): left hip arthroscopy with labral repair and cam debridement (Left) as a surgical intervention.  The patient's history has been reviewed, patient examined, no change in status, stable for surgery.  I have reviewed the patient's chart and labs.  Questions were answered to the patient's satisfaction.     [name]

## 2021-06-02 NOTE — Anesthesia Preprocedure Evaluation (Deleted)
Anesthesia Evaluation  Patient identified by MRN, date of birth, ID band Patient awake    Reviewed: Allergy & Precautions, NPO status , Patient's Chart, lab work & pertinent test results  Airway Mallampati: I  TM Distance: >3 FB Neck ROM: Full    Dental  (+) Teeth Intact, Dental Advisory Given   Pulmonary neg pulmonary ROS,    Pulmonary exam normal breath sounds clear to auscultation       Cardiovascular negative cardio ROS Normal cardiovascular exam Rhythm:Regular Rate:Normal     Neuro/Psych PSYCHIATRIC DISORDERS Depression negative neurological ROS     GI/Hepatic negative GI ROS, Neg liver ROS,   Endo/Other  Morbid obesityBMI 40  Renal/GU negative Renal ROS  negative genitourinary   Musculoskeletal  (+) Arthritis , Rheumatoid disorders,    Abdominal (+) + obese,   Peds  Hematology negative hematology ROS (+)   Anesthesia Other Findings   Reproductive/Obstetrics negative OB ROS                           Anesthesia Physical Anesthesia Plan  ASA: 3  Anesthesia Plan: Regional   Post-op Pain Management:    Induction: Intravenous  PONV Risk Score and Plan: 3 and Ondansetron, Dexamethasone, Midazolam and Treatment may vary due to age or medical condition  Airway Management Planned: Oral ETT  Additional Equipment: None  Intra-op Plan:   Post-operative Plan: Extubation in OR  Informed Consent: I have reviewed the patients History and Physical, chart, labs and discussed the procedure including the risks, benefits and alternatives for the proposed anesthesia with the patient or authorized representative who has indicated his/her understanding and acceptance.     Dental advisory given  Plan Discussed with: CRNA  Anesthesia Plan Comments:        Anesthesia Quick Evaluation

## 2021-06-04 NOTE — Anesthesia Preprocedure Evaluation (Addendum)
Anesthesia Evaluation  Patient identified by MRN, date of birth, ID band Patient awake    Reviewed: Allergy & Precautions, H&P , NPO status , Patient's Chart, lab work & pertinent test results  Airway Mallampati: II  TM Distance: >3 FB Neck ROM: Full    Dental  (+) Dental Advisory Given   Pulmonary neg pulmonary ROS,    breath sounds clear to auscultation       Cardiovascular Exercise Tolerance: Good negative cardio ROS   Rhythm:Regular Rate:Normal     Neuro/Psych Depression negative neurological ROS  negative psych ROS   GI/Hepatic negative GI ROS, Neg liver ROS,   Endo/Other  negative endocrine ROS  Renal/GU negative Renal ROS  negative genitourinary   Musculoskeletal  (+) Arthritis , Osteoarthritis,    Abdominal   Peds  Hematology negative hematology ROS (+)   Anesthesia Other Findings   Reproductive/Obstetrics negative OB ROS                            Anesthesia Physical Anesthesia Plan  ASA: 2  Anesthesia Plan: General   Post-op Pain Management:    Induction: Intravenous  PONV Risk Score and Plan: 4 or greater and Ondansetron, Dexamethasone, Midazolam and Droperidol  Airway Management Planned: Oral ETT  Additional Equipment:   Intra-op Plan:   Post-operative Plan: Extubation in OR  Informed Consent: I have reviewed the patients History and Physical, chart, labs and discussed the procedure including the risks, benefits and alternatives for the proposed anesthesia with the patient or authorized representative who has indicated his/her understanding and acceptance.     Dental advisory given  Plan Discussed with: CRNA  Anesthesia Plan Comments:        Anesthesia Quick Evaluation

## 2021-06-05 ENCOUNTER — Ambulatory Visit (HOSPITAL_COMMUNITY): Payer: BC Managed Care – PPO | Admitting: Anesthesiology

## 2021-06-05 ENCOUNTER — Ambulatory Visit (HOSPITAL_BASED_OUTPATIENT_CLINIC_OR_DEPARTMENT_OTHER)
Admission: RE | Admit: 2021-06-05 | Discharge: 2021-06-05 | Disposition: A | Discharge status: Home / Self Care | Payer: BC Managed Care – PPO | Source: Home / Self Care | Attending: Orthopaedic Surgery | Admitting: Orthopaedic Surgery

## 2021-06-05 ENCOUNTER — Encounter (HOSPITAL_COMMUNITY): Payer: Self-pay | Admitting: Orthopaedic Surgery

## 2021-06-05 ENCOUNTER — Other Ambulatory Visit: Payer: Self-pay

## 2021-06-05 ENCOUNTER — Ambulatory Visit (HOSPITAL_COMMUNITY): Payer: BC Managed Care – PPO

## 2021-06-05 ENCOUNTER — Encounter (HOSPITAL_COMMUNITY)
Admission: RE | Disposition: A | Discharge status: Home / Self Care | Payer: Self-pay | Source: Home / Self Care | Attending: Orthopaedic Surgery

## 2021-06-05 DIAGNOSIS — Z880 Allergy status to penicillin: Secondary | ICD-10-CM | POA: Insufficient documentation

## 2021-06-05 DIAGNOSIS — Z79899 Other long term (current) drug therapy: Secondary | ICD-10-CM | POA: Insufficient documentation

## 2021-06-05 DIAGNOSIS — S73192A Other sprain of left hip, initial encounter: Secondary | ICD-10-CM | POA: Diagnosis not present

## 2021-06-05 DIAGNOSIS — M1990 Unspecified osteoarthritis, unspecified site: Secondary | ICD-10-CM | POA: Insufficient documentation

## 2021-06-05 DIAGNOSIS — M24852 Other specific joint derangements of left hip, not elsewhere classified: Secondary | ICD-10-CM | POA: Diagnosis not present

## 2021-06-05 DIAGNOSIS — Z881 Allergy status to other antibiotic agents status: Secondary | ICD-10-CM | POA: Insufficient documentation

## 2021-06-05 DIAGNOSIS — Z91013 Allergy to seafood: Secondary | ICD-10-CM | POA: Insufficient documentation

## 2021-06-05 DIAGNOSIS — X58XXXA Exposure to other specified factors, initial encounter: Secondary | ICD-10-CM | POA: Insufficient documentation

## 2021-06-05 DIAGNOSIS — M25852 Other specified joint disorders, left hip: Secondary | ICD-10-CM | POA: Insufficient documentation

## 2021-06-05 DIAGNOSIS — Z419 Encounter for procedure for purposes other than remedying health state, unspecified: Secondary | ICD-10-CM

## 2021-06-05 HISTORY — PX: HIP ARTHROSCOPY: SHX668

## 2021-06-05 IMAGING — RF DG HIP (WITH PELVIS) OPERATIVE*L*
1 series · 6 of 6 positions shown · non-contrast
Comparison: Left hip x-rays dated [DATE].

CLINICAL DATA: Left hip labral repair and CAM debridement.

EXAM:
OPERATIVE LEFT HIP (WITH PELVIS IF PERFORMED)
TECHNIQUE: Fluoroscopic spot image(s) were submitted for interpretation
post-operatively.
FLUOROSCOPY TIME:  3 minutes, 20 seconds.

[Series 1: run · 6 of 6 slices shown]
[im 1/6]
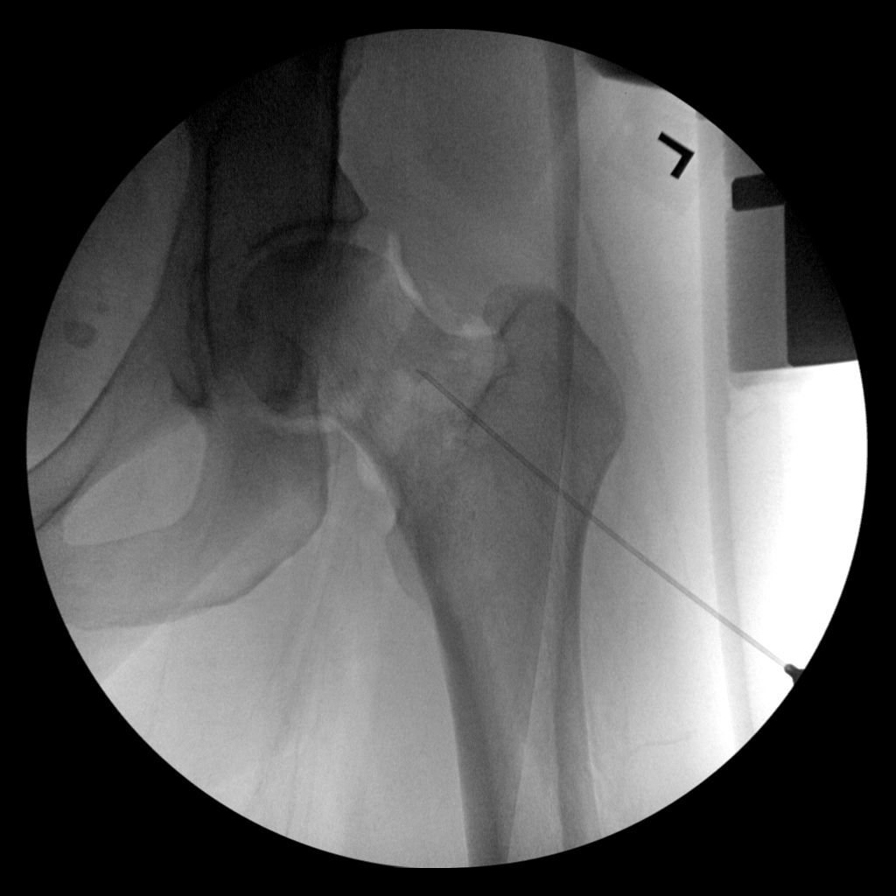
[im 2/6]
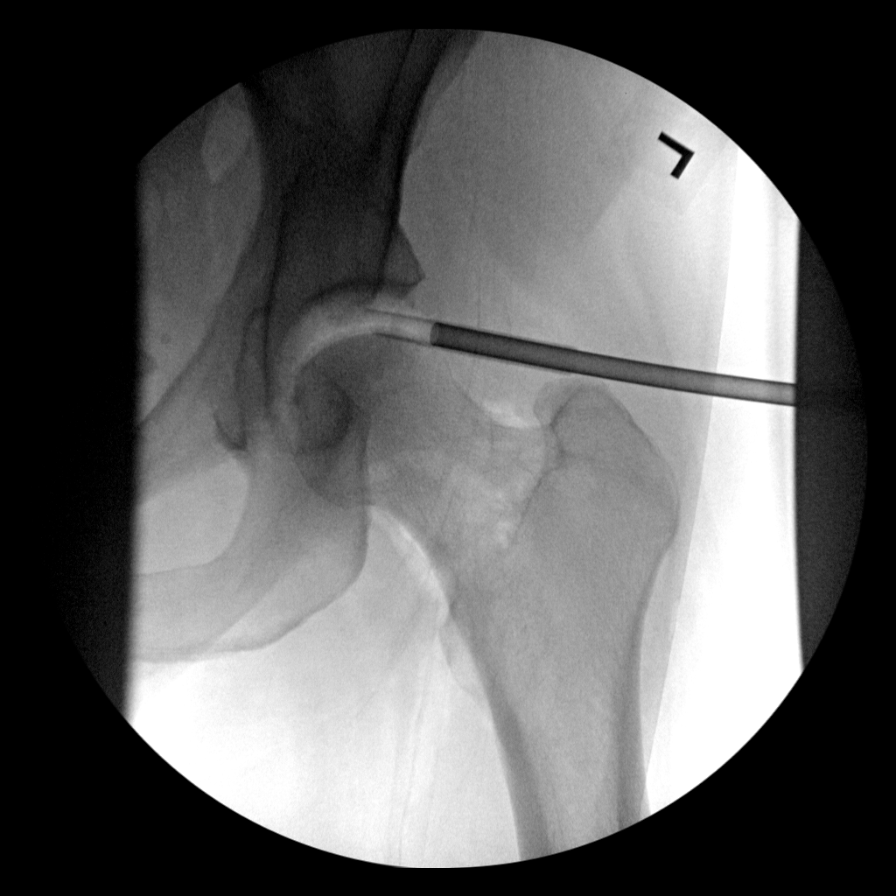
[im 3/6]
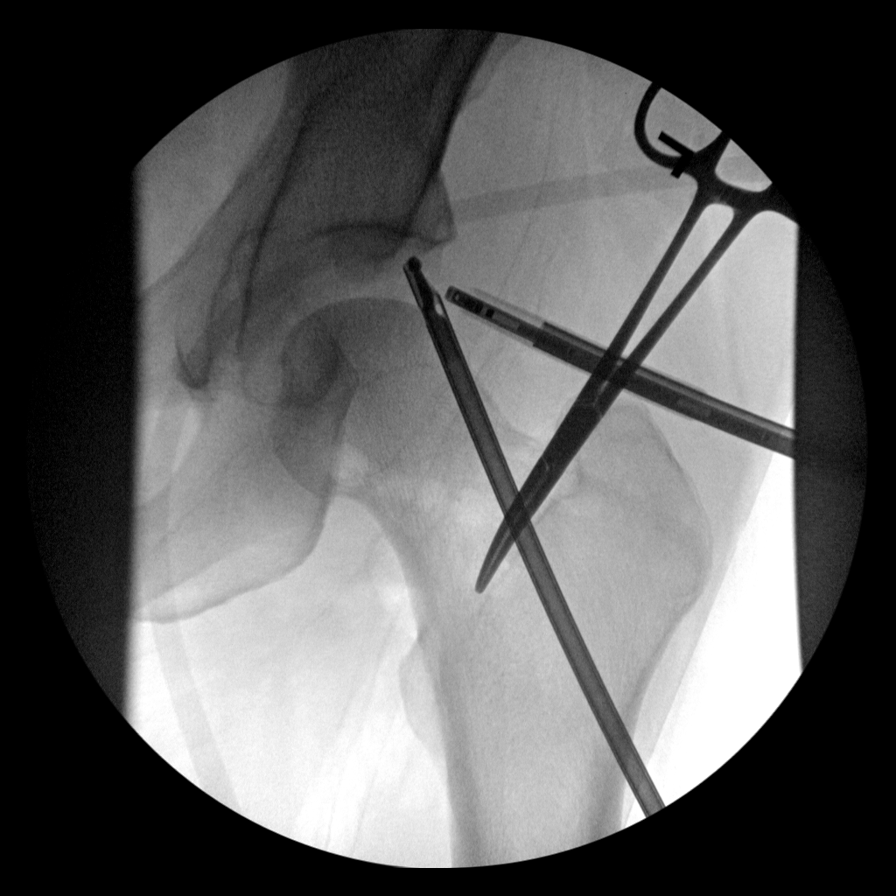
[im 4/6]
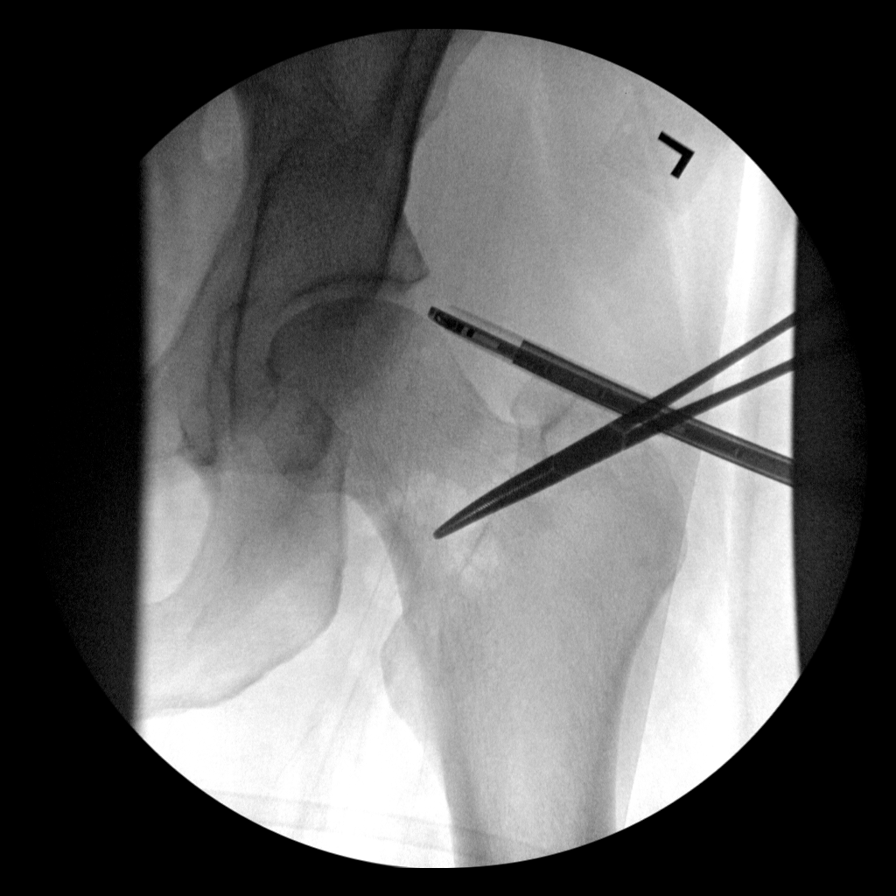
[im 5/6]
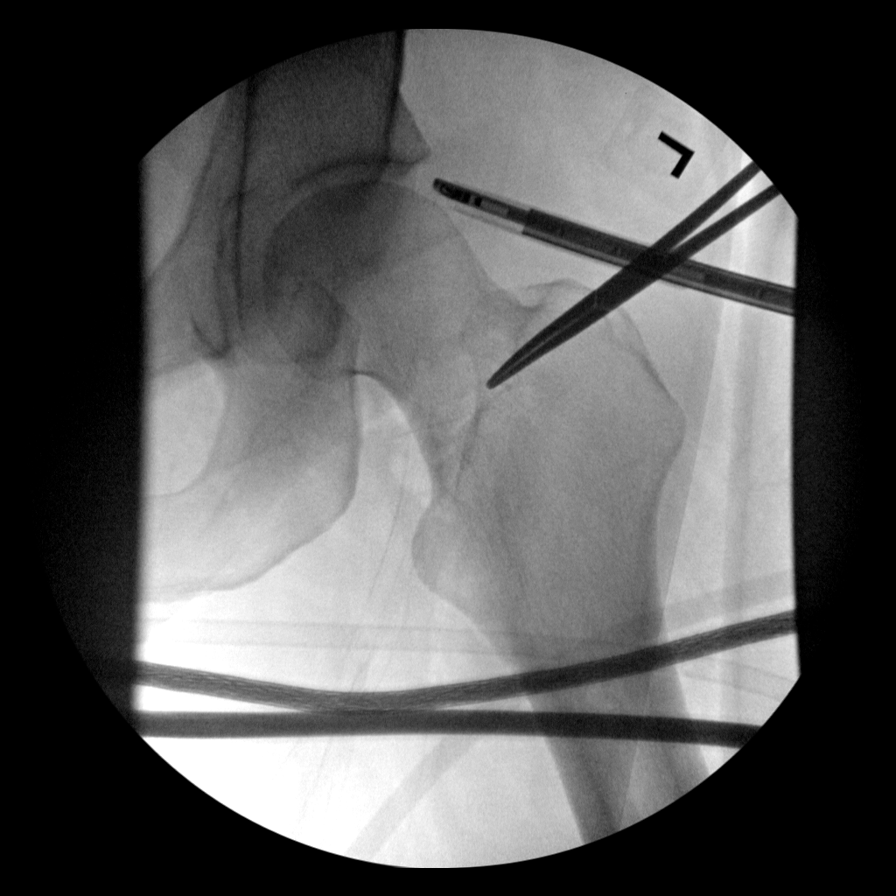
[im 6/6]
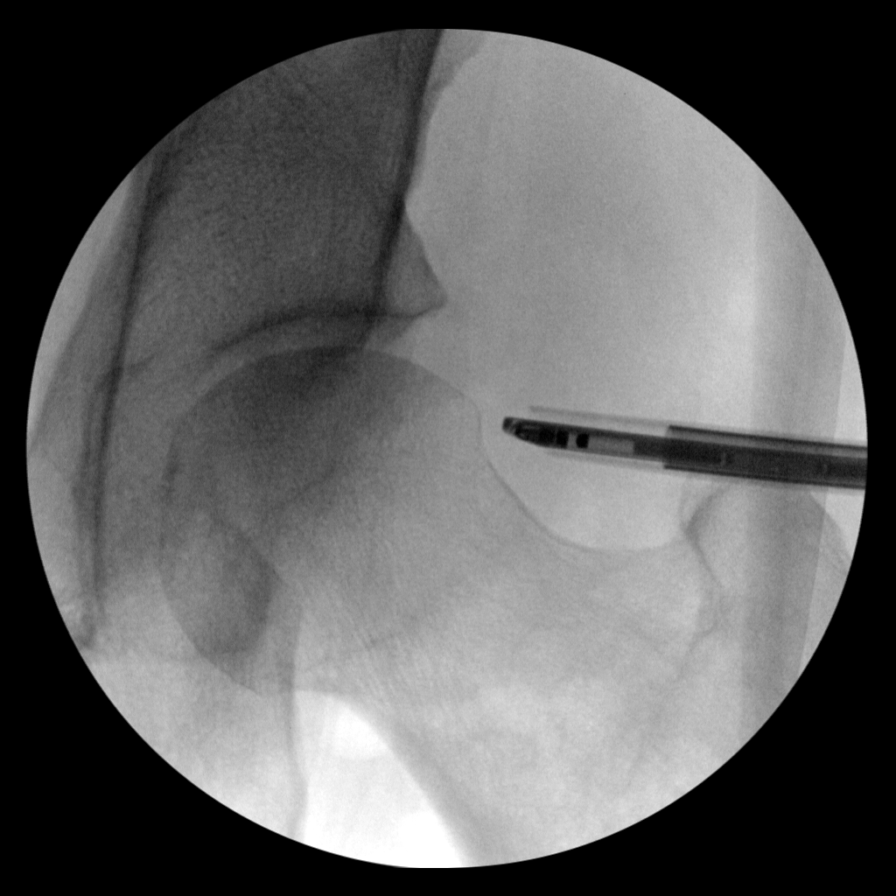

[6 of 6 positions shown; findings below may reference images not displayed]

FINDINGS: Multiple intraoperative fluoroscopic images of the left hip
demonstrate several, at least two, arthroscopy portals with surgical
instruments in the joint space. Two of the images demonstrate
widening of the left hip joint space presumably due to traction. No
acute osseous abnormality.
IMPRESSION: Intraoperative fluoroscopic guidance for left hip arthroscopy.

## 2021-06-05 SURGERY — ARTHROSCOPY HIP
Anesthesia: General | Site: Hip | Laterality: Left

## 2021-06-05 MED ORDER — DEXMEDETOMIDINE (PRECEDEX) IN NS 20 MCG/5ML (4 MCG/ML) IV SYRINGE
PREFILLED_SYRINGE | INTRAVENOUS | Status: DC | PRN
  Administered 2021-06-05: 12 ug via INTRAVENOUS
  Administered 2021-06-05: 8 ug via INTRAVENOUS

## 2021-06-05 MED ORDER — SUGAMMADEX SODIUM 200 MG/2ML IV SOLN
INTRAVENOUS | Status: DC | PRN
  Administered 2021-06-05: 200 mg via INTRAVENOUS

## 2021-06-05 MED ORDER — SODIUM CHLORIDE 0.9 % IR SOLN
Status: DC | PRN
  Administered 2021-06-05: 27000 mL

## 2021-06-05 MED ORDER — HYDROMORPHONE HCL 1 MG/ML IJ SOLN
0.2500 mg | INTRAMUSCULAR | Status: DC | PRN
  Administered 2021-06-05 (×4): 0.5 mg via INTRAVENOUS

## 2021-06-05 MED ORDER — SODIUM CHLORIDE 0.9 % IR SOLN
Status: DC | PRN
  Administered 2021-06-05: 3 mL

## 2021-06-05 MED ORDER — FENTANYL CITRATE (PF) 250 MCG/5ML IJ SOLN
INTRAMUSCULAR | Status: AC
  Filled 2021-06-05: qty 5

## 2021-06-05 MED ORDER — EPINEPHRINE PF 1 MG/ML IJ SOLN
INTRAMUSCULAR | Status: AC
  Filled 2021-06-05: qty 3

## 2021-06-05 MED ORDER — HYDROMORPHONE HCL 1 MG/ML IJ SOLN
INTRAMUSCULAR | Status: AC
  Filled 2021-06-05: qty 1

## 2021-06-05 MED ORDER — ONDANSETRON HCL 4 MG/2ML IJ SOLN
INTRAMUSCULAR | Status: DC | PRN
  Administered 2021-06-05: 4 mg via INTRAVENOUS

## 2021-06-05 MED ORDER — ROCURONIUM BROMIDE 10 MG/ML (PF) SYRINGE
PREFILLED_SYRINGE | INTRAVENOUS | Status: DC | PRN
  Administered 2021-06-05 (×2): 20 mg via INTRAVENOUS
  Administered 2021-06-05: 60 mg via INTRAVENOUS

## 2021-06-05 MED ORDER — BUPIVACAINE-EPINEPHRINE 0.5% -1:200000 IJ SOLN
INTRAMUSCULAR | Status: AC
  Filled 2021-06-05: qty 1

## 2021-06-05 MED ORDER — MIDAZOLAM HCL 5 MG/5ML IJ SOLN
INTRAMUSCULAR | Status: DC | PRN
  Administered 2021-06-05: 2 mg via INTRAVENOUS

## 2021-06-05 MED ORDER — CEFAZOLIN SODIUM-DEXTROSE 2-4 GM/100ML-% IV SOLN
2.0000 g | INTRAVENOUS | Status: AC
  Administered 2021-06-05: 2 g via INTRAVENOUS
  Filled 2021-06-05 (×2): qty 100

## 2021-06-05 MED ORDER — DEXAMETHASONE SODIUM PHOSPHATE 10 MG/ML IJ SOLN
INTRAMUSCULAR | Status: DC | PRN
  Administered 2021-06-05: 8 mg via INTRAVENOUS

## 2021-06-05 MED ORDER — TRANEXAMIC ACID-NACL 1000-0.7 MG/100ML-% IV SOLN
1000.0000 mg | INTRAVENOUS | Status: AC
  Administered 2021-06-05: 1000 mg via INTRAVENOUS
  Filled 2021-06-05 (×2): qty 100

## 2021-06-05 MED ORDER — CHLORHEXIDINE GLUCONATE 0.12 % MT SOLN
15.0000 mL | Freq: Once | OROMUCOSAL | Status: AC
  Administered 2021-06-05: 15 mL via OROMUCOSAL
  Filled 2021-06-05: qty 15

## 2021-06-05 MED ORDER — LACTATED RINGERS IV SOLN: INTRAVENOUS | Status: DC

## 2021-06-05 MED ORDER — DEXTROSE 5 % IV SOLN: 2000.0000 mg | Freq: Once | INTRAVENOUS | Status: DC

## 2021-06-05 MED ORDER — PROPOFOL 10 MG/ML IV BOLUS
INTRAVENOUS | Status: DC | PRN
Start: 2021-06-05 — End: 2021-06-05
  Administered 2021-06-05: 200 mg via INTRAVENOUS

## 2021-06-05 MED ORDER — LIDOCAINE 2% (20 MG/ML) 5 ML SYRINGE
INTRAMUSCULAR | Status: DC | PRN
Start: 2021-06-05 — End: 2021-06-05
  Administered 2021-06-05: 60 mg via INTRAVENOUS

## 2021-06-05 MED ORDER — PROPOFOL 10 MG/ML IV BOLUS
INTRAVENOUS | Status: AC
  Filled 2021-06-05: qty 40

## 2021-06-05 MED ORDER — ACETAMINOPHEN 500 MG PO TABS
1000.0000 mg | ORAL_TABLET | Freq: Once | ORAL | Status: AC
  Administered 2021-06-05: 1000 mg via ORAL
  Filled 2021-06-05: qty 2

## 2021-06-05 MED ORDER — DEXMEDETOMIDINE (PRECEDEX) IN NS 20 MCG/5ML (4 MCG/ML) IV SYRINGE
PREFILLED_SYRINGE | INTRAVENOUS | Status: AC
  Filled 2021-06-05: qty 5

## 2021-06-05 MED ORDER — ACETAMINOPHEN 500 MG PO TABS: 1000.0000 mg | ORAL_TABLET | Freq: Once | ORAL | Status: DC

## 2021-06-05 MED ORDER — FENTANYL CITRATE (PF) 250 MCG/5ML IJ SOLN
INTRAMUSCULAR | Status: DC | PRN
  Administered 2021-06-05: 25 ug via INTRAVENOUS
  Administered 2021-06-05: 100 ug via INTRAVENOUS
  Administered 2021-06-05 (×2): 50 ug via INTRAVENOUS
  Administered 2021-06-05: 25 ug via INTRAVENOUS
  Administered 2021-06-05 (×2): 50 ug via INTRAVENOUS

## 2021-06-05 MED ORDER — ORAL CARE MOUTH RINSE: 15.0000 mL | Freq: Once | OROMUCOSAL | Status: AC

## 2021-06-05 MED ORDER — MIDAZOLAM HCL 2 MG/2ML IJ SOLN
INTRAMUSCULAR | Status: AC
  Filled 2021-06-05: qty 2

## 2021-06-05 MED ORDER — BUPIVACAINE-EPINEPHRINE 0.5% -1:200000 IJ SOLN
INTRAMUSCULAR | Status: DC | PRN
  Administered 2021-06-05: 20 mL

## 2021-06-05 SURGICAL SUPPLY — 53 items
ANCHOR SUT 2.4 SS KNTLS #1 (Anchor) ×4 IMPLANT
APPLICATOR CHLORAPREP 10.5 ORG (MISCELLANEOUS) ×2 IMPLANT
BAG COUNTER SPONGE SURGICOUNT (BAG) ×2 IMPLANT
BIT DRILL SS CINCHLOCK (BIT) ×2 IMPLANT
BLADE SAMURAI STR FULL RADIUS (BLADE) ×2 IMPLANT
BLADE SURG 11 STRL SS (BLADE) ×2 IMPLANT
BUR ROUND HI FLUTE 8 4X19 (BURR) ×1 IMPLANT
BURR ROUND HI FLUTE 8 4X19 (BURR) ×2
CANNULA 8 789 TRANSPORT (CANNULA) ×2 IMPLANT
CANNULA OBTURATOR FLOWPORT ST5 (CANNULA) ×4 IMPLANT
DISSECTOR 4.0MM X 13CM (MISCELLANEOUS) ×2 IMPLANT
DISSECTOR 4.2MMX19CM HL (MISCELLANEOUS) IMPLANT
DRAPE C-ARM 42X72 X-RAY (DRAPES) ×2 IMPLANT
DRAPE HIPCHECK EQUIP (DRAPES) IMPLANT
DRAPE U-SHAPE 47X51 STRL (DRAPES) ×4 IMPLANT
DRAPE U-SHAPE 76X120 STRL (DRAPES) ×4 IMPLANT
DRSG TEGADERM 4X4.75 (GAUZE/BANDAGES/DRESSINGS) ×2 IMPLANT
DW OUTFLOW CASSETTE/TUBE SET (MISCELLANEOUS) ×2 IMPLANT
ELECT MENISCUS 165MM 90D (ELECTRODE) ×2 IMPLANT
GAUZE SPONGE 4X4 12PLY STRL (GAUZE/BANDAGES/DRESSINGS) ×2 IMPLANT
GLOVE SRG 8 PF TXTR STRL LF DI (GLOVE) ×1 IMPLANT
GLOVE SURG LTX SZ8 (GLOVE) ×2 IMPLANT
GLOVE SURG UNDER POLY LF SZ8 (GLOVE) ×1
GOWN STRL REUS W/ TWL LRG LVL3 (GOWN DISPOSABLE) ×4 IMPLANT
GOWN STRL REUS W/TWL LRG LVL3 (GOWN DISPOSABLE) ×4
HIPCHECK 3D IMAGE EQUIP USE (MISCELLANEOUS) ×2 IMPLANT
KIT HIP ARTHROSCOPY (SET/KITS/TRAYS/PACK) ×2 IMPLANT
KIT PATIENT POSITION MEDIUM (KITS) ×2 IMPLANT
KIT PORTAL ENTRY HIP ACCESS (KITS) ×2 IMPLANT
KIT TURNOVER KIT B (KITS) ×2 IMPLANT
MANIFOLD NEPTUNE II (INSTRUMENTS) ×4 IMPLANT
MARKER SKIN DUAL TIP RULER LAB (MISCELLANEOUS) ×2 IMPLANT
NEEDLE HYPO 18GX1.5 BLUNT FILL (NEEDLE) ×2 IMPLANT
NEEDLE INJECTOR II CARTRIDGE (MISCELLANEOUS) ×2 IMPLANT
PACK ARTHROSCOPY DSU (CUSTOM PROCEDURE TRAY) ×2 IMPLANT
PACK SURGICAL SETUP 50X90 (CUSTOM PROCEDURE TRAY) IMPLANT
PAD ARMBOARD 7.5X6 YLW CONV (MISCELLANEOUS) ×4 IMPLANT
PASSER SUT 1.5D CRESCENT (INSTRUMENTS) ×2 IMPLANT
PASSER SUT 70D UP ANGLED (INSTRUMENTS) ×2 IMPLANT
SPONGE T-LAP 18X18 ~~LOC~~+RFID (SPONGE) ×2 IMPLANT
SUT ETHILON 4 0 PS 2 18 (SUTURE) ×4 IMPLANT
SUT XBRAID 1.4 BLK/WHT (SUTURE) ×2 IMPLANT
SUT XBRAID TT 1.4 40X26 WH BL (SUTURE) ×4
SUT ZIPLINE SZ2 BLK (SUTURE) ×4 IMPLANT
SUT ZIPLINE SZ2 GREEN (SUTURE) ×2 IMPLANT
SUTURE XBRD TT 1.4 40X26 WH BL (SUTURE) ×2 IMPLANT
TAPE CLOTH 4X10 WHT NS (GAUZE/BANDAGES/DRESSINGS) ×2 IMPLANT
TOWEL GREEN STERILE (TOWEL DISPOSABLE) ×2 IMPLANT
TOWEL GREEN STERILE FF (TOWEL DISPOSABLE) ×2 IMPLANT
TUBE CONNECTING 12X1/4 (SUCTIONS) ×4 IMPLANT
TUBING ARTHROSCOPY IRRIG 16FT (MISCELLANEOUS) ×4 IMPLANT
WAND APOLLO RF 50D ABLATOR (BUR) ×2 IMPLANT
WATER STERILE IRR 1000ML POUR (IV SOLUTION) ×2 IMPLANT

## 2021-06-05 NOTE — Progress Notes (Signed)
Urine pregnancy test was negative on Friday 06/02/21. No need to repeat today per Dr. Glade Stanford.

## 2021-06-05 NOTE — Progress Notes (Signed)
Orthopedic Tech Progress Note Patient Details:  Linda Gill 11/04/1975 [id number]  PACU RN called requesting a pair of CRUTCHES for patient   Ortho Devices Type of Ortho Device: Crutches Ortho Device/Splint Interventions: Ordered, Application, Adjustment   Post Interventions Patient Tolerated: Well, Ambulated well Instructions Provided: Care of device, Poper ambulation with device  Donald Pore 06/05/2021, 4:56 PM

## 2021-06-05 NOTE — Discharge Instructions (Signed)
     Discharge Instructions    Attending Surgeon: [name], MD Office Phone Number: [phone]   Diagnosis and Procedures:    Surgeries Performed: Left hip arthroscopy with labral repair, cam and pincer debridement  Discharge Plan:    Diet: Resume usual diet. Begin with light or bland foods.  Drink plenty of fluids.  Activity:  Touchdown weight bearing utilizing crutches, until seen at postoperative Physical Therapy visit this week. Please keep your brace locked until follow-up. You are advised to go home directly from the hospital or surgical center. Restrict your activities.  GENERAL INSTRUCTIONS: 1.  Keep your surgical site elevated above your heart for at least 5-7 days or longer to prevent swelling. This will improve your comfort and your overall recovery following surgery.     2. Please call Dr. Serena Croissant office at [phone] with questions Monday-Friday during business hours. If no one answers, please leave a message and someone should get back to the patient within 24 hours. For emergencies please call 911 or proceed to the emergency room.   3. Patient to notify surgical team if experiences any of the following: Bowel/Bladder dysfunction, uncontrolled pain, nerve/muscle weakness, incision with increased drainage or redness, nausea/vomiting and Fever greater than 101.0 F.  Be alert for signs of infection including redness, streaking, odor, fever or chills. Be alert for excessive pain or bleeding and notify your surgeon immediately.  WOUND INSTRUCTIONS:   Leave your dressing/cast/splint in place until your post operative visit.  Keep it clean and dry.  Always keep the incision clean and dry until the staples/sutures are removed. If there is no drainage from the incision you should keep it open to air. If there is drainage from the incision you must keep it covered at all times until the drainage stops  Do not soak in a bath tub, hot tub, pool, lake or other body  of water until 21 days after your surgery and your incision is completely dry and healed.  If you have removable sutures (or staples) they must be removed 10-14 days (unless otherwise instructed) from the day of your surgery.     1)  Elevate the extremity as much as possible.  2)  Keep the dressing clean and dry.  3)  Please call us if the dressing becomes wet or dirty.  4)  If you are experiencing worsening pain or worsening swelling, please call.     MEDICATIONS: Resume all previous home medications at the previous prescribed dose and frequency unless otherwise noted Start taking the  pain medications on an as-needed basis as prescribed  Please taper down pain medication over the next week following surgery.  Ideally you should not require a refill of any narcotic pain medication.  Take pain medication with food to minimize nausea. In addition to the prescribed pain medication, you may take over-the-counter pain relievers such as Tylenol.  Do NOT take additional tylenol if your pain medication already has tylenol in it.  Aspirin 325mg  daily for four weeks.      FOLLOWUP INSTRUCTIONS: 1. Follow up at the Physical Therapy Clinic 3-4 days following surgery. This appointment should be scheduled unless other arrangements have been made.The Physical Therapy scheduling number is [phone] if an appointment has not already been arranged.  2. Contact Dr. [phone] office during office hours at [phone] or the practice after hours line at [phone] for non-emergencies. For medical emergencies call 911.   Discharge Location: Home

## 2021-06-05 NOTE — Brief Op Note (Signed)
   Brief Op Note  Date of Surgery: 06/05/2021  Preoperative Diagnosis: left hip labral tear/impingement  Postoperative Diagnosis: same  Procedure: Procedure(s): left hip arthroscopy with labral repair and cam debridement  Implants: Implant Name Type Inv. Item Serial No. Manufacturer Lot No. LRB No. Used Action  ANCHOR SUT 2.4 SS KNTLS #1 - [id number] Anchor ANCHOR SUT 2.4 SS KNTLS #1  [employer] [id number] Left 2 Implanted    Surgeons: Surgeon(s): [name], MD  Anesthesia: General    Estimated Blood Loss: See anesthesia record  Complications: None  Condition to PACU: Stable  Benancio Deeds, MD 06/05/2021 3:22 PM

## 2021-06-05 NOTE — Anesthesia Procedure Notes (Signed)
Procedure Name: Intubation Date/Time: 06/05/2021 11:36 AM Performed by: Elliot Dally, CRNA Pre-anesthesia Checklist: Patient identified, Emergency Drugs available, Suction available and Patient being monitored Patient Re-evaluated:Patient Re-evaluated prior to induction Oxygen Delivery Method: Circle System Utilized Preoxygenation: Pre-oxygenation with 100% oxygen Induction Type: IV induction Ventilation: Mask ventilation without difficulty and Oral airway inserted - appropriate to patient size Laryngoscope Size: Hyacinth Meeker and 2 Grade View: Grade I Tube type: Oral Tube size: 7.0 mm Number of attempts: 1 Airway Equipment and Method: Stylet and Oral airway Placement Confirmation: ETT inserted through vocal cords under direct vision, positive ETCO2 and breath sounds checked- equal and bilateral Secured at: 22 cm Tube secured with: Tape Dental Injury: Teeth and Oropharynx as per pre-operative assessment

## 2021-06-05 NOTE — Transfer of Care (Signed)
Immediate Anesthesia Transfer of Care Note  Patient: Linda Gill  Procedure(s) Performed: left hip arthroscopy with labral repair and cam debridement (Left: Hip)  Patient Location: PACU  Anesthesia Type:General  Level of Consciousness: awake, alert  and oriented  Airway & Oxygen Therapy: Patient Spontanous Breathing and Patient connected to face mask oxygen  Post-op Assessment: Report given to RN and Post -op Vital signs reviewed and stable  Post vital signs: Reviewed and stable  Last Vitals:  Vitals Value Taken Time  BP 103/69 06/05/21 1525  Temp 36.4 C 06/05/21 1525  Pulse 76 06/05/21 1530  Resp 23 06/05/21 1531  SpO2 100 % 06/05/21 1530  Vitals shown include unvalidated device data.  Last Pain:  Vitals:   06/05/21 0924  TempSrc:   PainSc: 3       Patients Stated Pain Goal: 1 (06/05/21 0924)  Complications: No notable events documented.

## 2021-06-05 NOTE — Op Note (Signed)
Date of Surgery: 06/05/2021  INDICATIONS: Linda Gill is a 45 y.o.-year-old female with left femoral acetabular impingement in the setting of a labral tear.  She has exhaustively tried nonoperative therapy including physical therapy injections and NSAIDs..  The risk and benefits of the procedure with discussed in detail and documented in the pre-operative evaluation.  PREOPERATIVE DIAGNOSIS: 1.  Left pincer deformity 2.  Left labral tear left hip 3.  Left cam deformity left hip  POSTOPERATIVE DIAGNOSIS: Same.  PROCEDURE: 1.  Left hip arthroscopy with labral repair 2.  Left pincer debridement 3.  Left cam debridement  SURGEON: Benancio Deeds MD  ASSISTANT: Linda Gill, ATC; necessary for the timely completion of procedure and due to complexity of procedure.  ANESTHESIA:  general  IV FLUIDS AND URINE: See anesthesia record.  ANTIBIOTICS: Ancef 2 g  ESTIMATED BLOOD LOSS: 25 mL.  IMPLANTS:  Implant Name Type Inv. Item Serial No. Manufacturer Lot No. LRB No. Used Action  ANCHOR SUT 2.4 SS KNTLS #1 - [id number] Anchor ANCHOR SUT 2.4 SS KNTLS #1  [employer] [id number] Left 2 Implanted    DRAINS: None  CULTURES: None  COMPLICATIONS: none  DESCRIPTION OF PROCEDURE:  DESCRIPTION OF PROCEDURE:  Cartilage Grade 1 changes of the acetabular cartilage High grade cartilage lesion: no The remainder of the femoral and acetabular cartilage was normal.   Labrum Labral degeneration, yellow over 50% of labrum: yes Hypoplastic labrum: no   Boundaries of labral tear Convention (3 o'clock anterior, 9 o'clock posterior) Anterior boundary: 3 o'clock Posterior boundary: 11:30 o'clock   OPERATIVE REPORT:  The patient was brought to the operating room, placed supine on the operating table, and bony prominences were padded.  The traction boots were applied with padding to ensure that safe traction could be applied through the feet.  The contralateral limb was abducted maximally and  light traction was applied.  The operative leg was brought into neutral position.  The flouroscopic c-arm was brought between the legs for an AP image.  The patient was prepped and draped in a sterile fashion.  Time-out was performed and landmarks were identified. Traction was obtained and care was taken to ensure the least amount of force necessary to allow safe access to the joint of 8-33mm.  This was checked with fluoroscopy.    Next we placed an anterolateral portal under the assistance of fluoroscopy.  First, fluoroscopy was used to estimate the trajectory and starting point.  A 45mm incision with a #11 blade was made and a straight hemostat was used to dilate the portal through the appropriate tract.  We then placed a 14-gauge hypodermic needle with careful technique to be as close to the femoral head as possible and parallel to the sorcele to ensure no iatrogenic damage to the labrum.  This released the negative pressure environment and the amount of traction was adjusted to maintain the 8-72mm of distraction.  A nitinol wire was placed through the needle and flouroscopy was used to ensure it extended to the medial wall of the acetabulum.  The Flowport from [employer] was placed over the wire and the nitinol wire was retracted to just inside the capsule during insertion of the dilator and cannula to minimize the risk of breakage. The arthroscope was placed next and we visualized the anterior triangle.    We then placed the anterior portal under direct visualization using the technique described above.  This was safely placed as well without damage to the labrum or  femoral head.  We then switched our arthroscope to the anterior portal to ensure we were not through the labrum - we were safely through the capsule only.  We then proceeded with a transverse capsulotomy connecting the 2 portals in the same plane utilizing the Samurai blade from [employer].  The Injector device from [employer]  was used to place traction stitches each in the medial and lateral arms of the proximal capsule.  A Kelly clamp was used to hold the suture against the skin to apply traction. This allowed access to the acetabular rim and labrum as well as protection of the native edges of the capsule.  We identified the anterior inferior iliac spine proximally, the psoas tendon medially and the rectus tendon laterally as landmarks.  We then proceeded with a diagnostic arthroscopy - the results can be found in the findings section above.    We then used the 50 degree hip specific radiofrequency device from [employer]. and a 29mm shaver to clear the superior acetabulum and expose the subspinous region.  Next we exposed the acetabular rim leaving the chondral labral junction intact.  Working from both portals, the acetabular rim/subspinous region was reshaped with 5.5 mm bur consistent with the preoperative three-dimensional imaging.  When adequate reshaping was obtained we then proceeded with the labral repair.  Distal anterior lateral portal was made 4 cm distal to our proximal anterior lateral portal and 1 mm medial.  A Transport cannula was inserted.  Care was taken to ensure the cannula was in the intermuscular plane between the gluteus minimus and iliocapsularis.     We placed 2 anchors at the 2:00 o'clock as well as 00:30 o'clock, with a simple stitch at each of the above positions respectively. The sutures were passed using the crescent [employer].  This resulted in anatomic labral repair.  We debrided the loose cartilage at the rim and residual degenerative labral tissue.  Traction was let down with total traction time of 92 minutes.    We then proceeded to perform a cam debridement.  Traction stitches were placed using the injector into the lateral capsule.  This was retracted and using this a electrocautery was used to create a T capsulotomy one third on the way to the femoral neck.  Using dynamic fluoroscopy and  hip check we reshaped the cam deformity using a 5.5 mm bur.  This was done in various degrees of flexion internal and external rotation in order to achieve a symmetric cam resection.  We are happy with the quality of this resection without additional impingement.   Dynamic exam showed no residual impingement flexed to 90 degrees with maximal internal rotation.   Finally, we performed a complete capsular closure with, utilizing 2 stitches in the interportal capsulotomy as well as the T portion of the capsulotomy.  A watertight repair of the capsule was obtained.  We then removed the arthroscope and closed the incisions with 4-0 nylon simple stitches.  A sterile dressing was applied..  The patient was awakened from anesthesia and transferred to PACU in stable condition.  Postoperative care includes:    Linda Gill ATC was necessary for opening, closing, retracting, limb positioning and overall facilitation and timely completion of the procedure.     POSTOPERATIVE PLAN: The patient will be touchdown weightbearing for a total of 2 weeks.  She will be in a brace from 20 to 90 degrees.  She will have physical therapy beginning this week.  We will see her back  in 2 weeks for suture removal.  Benancio Deeds, MD 3:35 PM

## 2021-06-05 NOTE — Progress Notes (Signed)
Anesthesia to bedside due to anxiety. 20 mcg Precedex given by Dr. Bradley Ferris. Vital signs stable at this time.   Jacobo Forest, RN

## 2021-06-05 NOTE — Interval H&P Note (Signed)
History and Physical Interval Note:  06/05/2021 11:23 AM  Linda Gill  has presented today for surgery, with the diagnosis of left hip labral tear/impingement.  The various methods of treatment have been discussed with the patient and family. After consideration of risks, benefits and other options for treatment, the patient has consented to  Procedure(s): left hip arthroscopy with labral repair and cam debridement (Left) as a surgical intervention.  The patient's history has been reviewed, patient examined, no change in status, stable for surgery.  I have reviewed the patient's chart and labs.  Questions were answered to the patient's satisfaction.     [name]

## 2021-06-05 NOTE — Anesthesia Postprocedure Evaluation (Signed)
Anesthesia Post Note  Patient: Linda Gill  Procedure(s) Performed: left hip arthroscopy with labral repair and cam debridement (Left: Hip)     Patient location during evaluation: PACU Anesthesia Type: General Level of consciousness: awake Pain management: pain level controlled Vital Signs Assessment: post-procedure vital signs reviewed and stable Respiratory status: spontaneous breathing, nonlabored ventilation, respiratory function stable and patient connected to nasal cannula oxygen Cardiovascular status: blood pressure returned to baseline and stable Postop Assessment: no apparent nausea or vomiting Anesthetic complications: no   No notable events documented.  Last Vitals:  Vitals:   06/05/21 1610 06/05/21 1625  BP: 94/74 128/76  Pulse: 66 74  Resp: 10 18  Temp:  (!) 36.3 C  SpO2: 96% 96%    Last Pain:  Vitals:   06/05/21 1625  TempSrc:   PainSc: 0-No pain                 Aili Casillas P Amon Costilla

## 2021-06-06 ENCOUNTER — Encounter (HOSPITAL_COMMUNITY): Payer: Self-pay | Admitting: Orthopaedic Surgery

## 2021-06-08 ENCOUNTER — Encounter (HOSPITAL_BASED_OUTPATIENT_CLINIC_OR_DEPARTMENT_OTHER): Payer: Self-pay | Admitting: Physical Therapy

## 2021-06-08 ENCOUNTER — Other Ambulatory Visit: Payer: Self-pay

## 2021-06-08 ENCOUNTER — Ambulatory Visit (HOSPITAL_BASED_OUTPATIENT_CLINIC_OR_DEPARTMENT_OTHER): Payer: BC Managed Care – PPO | Admitting: Physical Therapy

## 2021-06-08 DIAGNOSIS — R262 Difficulty in walking, not elsewhere classified: Secondary | ICD-10-CM

## 2021-06-08 DIAGNOSIS — M6281 Muscle weakness (generalized): Secondary | ICD-10-CM

## 2021-06-08 DIAGNOSIS — M25552 Pain in left hip: Secondary | ICD-10-CM

## 2021-06-08 DIAGNOSIS — M25561 Pain in right knee: Secondary | ICD-10-CM

## 2021-06-08 NOTE — Therapy (Signed)
OUTPATIENT PHYSICAL THERAPY RE-EVALUATION NOTE   Patient Name: Linda Gill MRN: [id number] DOB:Nov 09, 1975, 45 y.o., female Today's Date: 06/08/2021  PCP: Tracey Harries, MD REFERRING PROVIDER: Tracey Harries, MD   PT End of Session - 06/08/21 0855     Visit Number 6    Number of Visits 21    Date for PT Re-Evaluation 09/06/21    Authorization Type BCBS    PT Start Time 0850    PT Stop Time 0930    PT Time Calculation (min) 40 min    Activity Tolerance Patient tolerated treatment well;Patient limited by pain    Behavior During Therapy [facility] for tasks assessed/performed               Past Medical History:  Diagnosis Date   Arthritis    RA   Depression    Past Surgical History:  Procedure Laterality Date   BREAST MASS EXCISION     HIP ARTHROSCOPY Left 06/05/2021   Procedure: left hip arthroscopy with labral repair and cam debridement;  Surgeon: [name], MD;  Location: [facility] OR;  Service: Orthopedics;  Laterality: Left;   There are no problems to display for this patient.   ONSET DATE: 06/05/2021 L HIP ARTHROSCOPY WITH LABRAL REPAIR, L CAM DEBRIDEMENT, L PINCER DEBRIDEMENT   REFERRING DIAG: M25.552 (ICD-10-CM) - Left hip pain M17.11 (ICD-10-CM) - Patellofemoral arthritis of right knee    THERAPY DIAG:  Pain in joint of right knee   Pain in left hip   Muscle weakness (generalized)   Difficulty walking   SUBJECTIVE:    SUBJECTIVE STATEMENT:  Pt denies systemic signs and signs of infection. Pt states surgery went well with exception of cancellation. Pt has been compliant with MD instructions and self care. Pt states she is no longer choosing to wear compression garments but is continuing to do ankle movements. Pt states she is wearing her brace but has not been icing.                                                                        PERTINENT HISTORY: RA, L labral tear   PAIN:  Are you having pain? Yes VAS scale: 3/10 L hip Pain location: L hip   Pain orientation: Right and Left  PAIN TYPE: throbbing, aching Pain description: constant  Aggravating factors: postural Relieving factors: resting     THERAPY DIAG:  Pain in joint of right knee  Pain in left hip  Muscle weakness (generalized)  Difficulty walking  PERTINENT HISTORY: PERTINENT HISTORY: RA, L labral tear   PRECAUTIONS: None    OBJECTIVE:   Pt presents with bilat axillary crutches and hip brace. No compression garments worn.   L HIP PROM- up to 90 deg flexion, 30 deg IR and ER, hip extension to 0 in supine L ankle with to have min edema in dependent position  Pt requires UE minA with L leg during transfers. Able to perform AROM in short lever position.  Incision site clean, dry, covered with gauze and Tegaderm style dressing. No drainage noted. No acute signs of infection. Bandages dry and non- saturated in appearance. Pt provided with gauze should dressing need to be changed. Pt report being sensitive to adhesive and elects  to use own tape/adhesive at home.  Pt crutches adjusted to proper height and show proper gait mechanics and sequencing on stairs. Pt return demo.  TODAY'S TREATMENT:  Pt received PROM of L hip to within surgical precautions as listed above.    Exercises Hooklying Gluteal Sets - 3-5 x daily - 7 x weekly - 2 sets - 10 reps - 5 hold Supine Quad Set - 3-5 x daily - 7 x weekly - 2 sets - 10 reps - 5 hold Seated Ankle Pumps - 3-5 x daily - 7 x weekly - 2 sets - 10 reps Supine Transversus Abdominis Bracing - Hands on Stomach - 3-5 x daily - 7 x weekly - 2 sets - 10 reps - 5 hold       PATIENT EDUCATION:  Education details: surgical precautions/ protocol, cryotherapy, use of compression garments/exercise for prevention of clots, anatomy, exercise progression, DOMS expectations, recovery times, post-surgical progression Person educated: Patient Education method: Explanation, Demonstration, Tactile cues, Verbal cues, and  Handouts Education comprehension: verbalized understanding, returned demonstration, verbal cues required, and tactile cues required     HOME EXERCISE PROGRAM:    Access Code: [email or web address] URL: [email or web address] Date: 06/08/2021 Prepared by: Zebedee Iba      ASSESSMENT:   CLINICAL IMPRESSION: Pt presents with expected s/s following L hip arthroscopy and L hip labral repair. Pt presents with well managed pain with no signs of infection. Pt is able maintain surgical precautions and return demo edu re AD usage and precautions. Pt appears to have good understanding of edu with regards to icing, prevention of clots, and HEP. Pt able to reach limits of L hip PROM and perform AROM in short lever position.  Objective impairments include Abnormal gait, decreased activity tolerance, decreased balance, difficulty walking, decreased ROM, decreased strength, hypomobility, impaired flexibility, improper body mechanics, postural dysfunction, and pain. These impairments are limiting patient from community activity, driving, occupation, shopping, and ambulation. Pt would benefit from continued skilled therapy in order to reach goals and maximize functional bilat LE  strength and ROM for full return to PLOF.    REHAB POTENTIAL: Good   CLINICAL DECISION MAKING: Stable/uncomplicated   EVALUATION COMPLEXITY: Low     GOALS:     SHORT TERM GOALS:   STG Name Target Date Goal status  1 Pt will become independent with HEP in order to demonstrate synthesis of PT education.   06/22/2021  INITIAL  2 Pt will be able to demonstrate ability to perform full AROM of L LE without assistance LE function for self-care and house hold duties.   07/20/2021  INITIAL  3 Pt will be able to demonstrate ability to perform stairs and gait with no AD/UE and equal WB in order to demonstrate functional improvement in L LE function for self-care and house hold duties.     07/20/2021 INITIAL  4 Pt will have an at  least 9 pt improvement in LEFS measure in order to demonstrate MCID improvement in daily function.   07/20/2021 INITIAL    LONG TERM GOALS:    LTG Name Target Date Goal status  1 Pt  will become independent with final HEP in order to demonstrate synthesis of PT education.   08/31/2021  INITIAL  2 Pt will be able to demonstrate full floor to stand, kneeling to stand, and stand to kneeling transfer without UE support in order to demonstrate functional improvement in L LE function for ADL/house hold duties. 08/31/2021 INITIAL  3 Pt will be able to  demonstrate DL squat with >/=33 lbs in order to demonstrate functional improvement in bilat LE strength for return to PLOF and exercise.   08/31/2021 INITIAL  4 Pt will have an at least 18 pt improvement in LEFS measure in order to demonstrate MCID improvement in daily function.     08/31/2021 INITIAL    PLAN: PT FREQUENCY: 1-2x/week   PT DURATION: 12 weeks   PLANNED INTERVENTIONS: Therapeutic exercises, Therapeutic activity, Neuro Muscular re-education, Balance training, Gait training, Patient/Family education, Joint mobilization, Stair training, Orthotic/Fit training, Aquatic Therapy, Dry Needling, Electrical stimulation, Spinal mobilization, Cryotherapy, Moist heat, scar mobilization, Taping, Vasopneumatic device, Ultrasound, Ionotophoresis 4mg /ml Dexamethasone, and Manual therapy   PLAN FOR NEXT SESSION: review HEP, PROM, trial heel slide with assist, supine march, STM   04-14-1978 PT, DPT 06/08/21 10:09 AM     [facility] [address] [place], [place], [place] Phone: [phone]   Fax:  [phone]  Patient name: Linda Gill MRN: Mariane Masters DOB: 24-Mar-1976

## 2021-06-12 ENCOUNTER — Other Ambulatory Visit: Payer: Self-pay

## 2021-06-12 ENCOUNTER — Encounter (HOSPITAL_BASED_OUTPATIENT_CLINIC_OR_DEPARTMENT_OTHER): Payer: Self-pay | Admitting: Physical Therapy

## 2021-06-12 ENCOUNTER — Encounter (HOSPITAL_BASED_OUTPATIENT_CLINIC_OR_DEPARTMENT_OTHER): Payer: BC Managed Care – PPO | Attending: Family Medicine | Admitting: Physical Therapy

## 2021-06-12 DIAGNOSIS — M25561 Pain in right knee: Secondary | ICD-10-CM | POA: Diagnosis not present

## 2021-06-12 DIAGNOSIS — M6281 Muscle weakness (generalized): Secondary | ICD-10-CM | POA: Diagnosis not present

## 2021-06-12 DIAGNOSIS — R262 Difficulty in walking, not elsewhere classified: Secondary | ICD-10-CM | POA: Diagnosis not present

## 2021-06-12 DIAGNOSIS — M25552 Pain in left hip: Secondary | ICD-10-CM

## 2021-06-12 NOTE — Therapy (Addendum)
OUTPATIENT PHYSICAL THERAPY TREATMENT NOTE   Patient Name: Linda Gill MRN: [id number] DOB:1976/03/31, 45 y.o., female Today's Date: 06/12/2021  PCP: Tracey Harries, MD REFERRING PROVIDER: Tracey Harries, MD   PT End of Session - 06/12/21 1030     Visit Number 7    Number of Visits 21    Date for PT Re-Evaluation 09/06/21    Authorization Type BCBS    PT Start Time 0935    PT Stop Time 1015    PT Time Calculation (min) 40 min    Equipment Utilized During Treatment Other (comment)   crutches and brace   Activity Tolerance Patient tolerated treatment well;Patient limited by pain    Behavior During Therapy [facility] for tasks assessed/performed                Past Medical History:  Diagnosis Date   Arthritis    RA   Depression    Past Surgical History:  Procedure Laterality Date   BREAST MASS EXCISION     HIP ARTHROSCOPY Left 06/05/2021   Procedure: left hip arthroscopy with labral repair and cam debridement;  Surgeon: [name], MD;  Location: [facility] OR;  Service: Orthopedics;  Laterality: Left;   There are no problems to display for this patient.   ONSET DATE: 06/05/2021 L HIP ARTHROSCOPY WITH LABRAL REPAIR, L CAM DEBRIDEMENT, L PINCER DEBRIDEMENT   REFERRING DIAG: M25.552 (ICD-10-CM) - Left hip pain M17.11 (ICD-10-CM) - Patellofemoral arthritis of right knee    THERAPY DIAG:  Pain in joint of right knee   Pain in left hip   Muscle weakness (generalized)   Difficulty walking   SUBJECTIVE:    SUBJECTIVE STATEMENT:  Pt states that the HEP was okay and without pain. She states she was sore after last session from moving the leg around more. She notices her L calf is tighter. She has been maintaining precautions. Pt states "I have been putting more weight on it than I probably should."                                                                     PERTINENT HISTORY: RA, L labral tear   PAIN:  Are you having pain? Yes VAS scale: 1/10 L hip Pain  location: L hip incision site Pain orientation: Right and Left  PAIN TYPE: throbbing, aching Pain description: constant  Aggravating factors: postural Relieving factors: resting     THERAPY DIAG:  Pain in joint of right knee  Pain in left hip  Muscle weakness (generalized)  Difficulty walking  PERTINENT HISTORY: PERTINENT HISTORY: RA, L labral tear   PRECAUTIONS: None    OBJECTIVE:   Pt bilat axillary crutches and hip brace. No compression garments.  L HIP PROM- up to 90 deg flexion, 30 deg IR and ER, hip extension to 0 in supine  Pt still requires UE minA with L leg during transfers. Able to perform AROM at hip in short lever position. Edu given about precautions and no pain with movements  Review of proper gait mechanics and sequencing on stairs. Pt return demo.  TODAY'S TREATMENT:  Pt received PROM of L hip to within surgical precautions as listed above.    Exercises Hooklying Gluteal Sets -  10 reps -  5 hold Supine Quad Set  - 5 hold 10x Seated Ankle Pumps - 3-5 x daily - 7 x weekly - 2 sets - 10 reps Supine march with TrA brace 3s 10x (do no break 90, foot clearing table only, near full knee flexion position only)  Calf stretch standing 30s 3s LAQ 2x10 5lbs  bilat Prone quad stretch, pillow under hips 30s 2x PPT 10x  PROM to limits listed above. Grade I oscillation in 30 flexion and 15 deg ABD for pain management post PROM 3 mins       PATIENT EDUCATION:  Education details: surgical precautions/ protocol, cryotherapy, use of compression garments/exercise for prevention of clots, anatomy, exercise progression, DOMS expectations, recovery times, post-surgical progression Person educated: Patient Education method: Explanation, Demonstration, Tactile cues, Verbal cues, and Handouts Education comprehension: verbalized understanding, returned demonstration, verbal cues required, and tactile cues required     HOME EXERCISE PROGRAM:    Access Code:  [email or web address] URL: [email or web address] Date: 06/12/2021 Prepared by: Zebedee Iba  Exercises Hooklying Gluteal Sets - 3-5 x daily - 7 x weekly - 2 sets - 10 reps - 5 hold Supine Quad Set - 3-5 x daily - 7 x weekly - 2 sets - 10 reps - 5 hold Seated Ankle Pumps - 3-5 x daily - 7 x weekly - 2 sets - 10 reps Supine March with Posterior Pelvic Tilt - 3-5 x daily - 7 x weekly - 2 sets - 10 reps Seated Long Arc Quad - 3-5 x daily - 7 x weekly - 2 sets - 10 reps       ASSESSMENT:   CLINICAL IMPRESSION:  Pt demonstrates good awareness of safety precautions and able to maintain throughout follow up treatment session. Pt tolerated PROM well with no noted stiffness up to protocol ROM limations. Pt able to tolerate progressed stretching and AROM exercise without pain and able to maintain precautions. Plan to continue with protocol and AROM as able. Pt would benefit from continued skilled therapy in order to reach goals and maximize functional bilat LE  strength and ROM for full return to PLOF.    REHAB POTENTIAL: Good   CLINICAL DECISION MAKING: Stable/uncomplicated   EVALUATION COMPLEXITY: Low     GOALS:     SHORT TERM GOALS:   STG Name Target Date Goal status  1 Pt will become independent with HEP in order to demonstrate synthesis of PT education.   06/26/2021  INITIAL  2 Pt will be able to demonstrate ability to perform full AROM of L LE without assistance LE function for self-care and house hold duties.   07/24/2021  INITIAL  3 Pt will be able to demonstrate ability to perform stairs and gait with no AD/UE and equal WB in order to demonstrate functional improvement in L LE function for self-care and house hold duties.     07/20/2021 INITIAL  4 Pt will have an at least 9 pt improvement in LEFS measure in order to demonstrate MCID improvement in daily function.   07/20/2021 INITIAL    LONG TERM GOALS:    LTG Name Target Date Goal status  1 Pt  will become independent with  final HEP in order to demonstrate synthesis of PT education.   09/04/2021  INITIAL  2 Pt will be able to demonstrate full floor to stand, kneeling to stand, and stand to kneeling transfer without UE support in order to demonstrate functional improvement in L LE function for ADL/house hold duties. 08/31/2021 INITIAL  3 Pt  will be able to demonstrate DL squat with >/=71 lbs in order to demonstrate functional improvement in bilat LE strength for return to PLOF and exercise.   08/31/2021 INITIAL  4 Pt will have an at least 18 pt improvement in LEFS measure in order to demonstrate MCID improvement in daily function.     08/31/2021 INITIAL    PLAN: PT FREQUENCY: 1-2x/week   PT DURATION: 12 weeks   PLANNED INTERVENTIONS: Therapeutic exercises, Therapeutic activity, Neuro Muscular re-education, Balance training, Gait training, Patient/Family education, Joint mobilization, Stair training, Orthotic/Fit training, Aquatic Therapy, Dry Needling, Electrical stimulation, Spinal mobilization, Cryotherapy, Moist heat, scar mobilization, Taping, Vasopneumatic device, Ultrasound, Ionotophoresis 4mg /ml Dexamethasone, and Manual therapy   PLAN FOR NEXT SESSION: review HEP, PROM, trial heel slide with assist, STM, prone knee flexion   PT, DPT 06/12/21 10:31 AM   Patient name: DONNEISHA BEANE MRN: Mariane Masters DOB: 06-20-1976

## 2021-06-14 ENCOUNTER — Encounter (HOSPITAL_BASED_OUTPATIENT_CLINIC_OR_DEPARTMENT_OTHER): Payer: Self-pay | Admitting: Physical Therapy

## 2021-06-14 ENCOUNTER — Ambulatory Visit (HOSPITAL_BASED_OUTPATIENT_CLINIC_OR_DEPARTMENT_OTHER): Payer: BC Managed Care – PPO | Admitting: Physical Therapy

## 2021-06-14 ENCOUNTER — Other Ambulatory Visit: Payer: Self-pay

## 2021-06-14 DIAGNOSIS — R262 Difficulty in walking, not elsewhere classified: Secondary | ICD-10-CM

## 2021-06-14 DIAGNOSIS — M25561 Pain in right knee: Secondary | ICD-10-CM

## 2021-06-14 DIAGNOSIS — M25552 Pain in left hip: Secondary | ICD-10-CM

## 2021-06-14 DIAGNOSIS — M6281 Muscle weakness (generalized): Secondary | ICD-10-CM

## 2021-06-14 NOTE — Therapy (Signed)
OUTPATIENT PHYSICAL THERAPY TREATMENT NOTE   Patient Name: ZLATY ALEXA MRN: [id number] DOB:Dec 13, 1975, 45 y.o., female Today's Date: 06/14/2021  PCP: Tracey Harries, MD REFERRING PROVIDER: Merton Border, MD   PT End of Session - 06/14/21 [id number]     Visit Number 8    Number of Visits 21    Date for PT Re-Evaluation 09/06/21    Authorization Type BCBS    PT Start Time 0930    PT Stop Time 1010    PT Time Calculation (min) 40 min    Equipment Utilized During Treatment Other (comment)   crutches and brace   Activity Tolerance Patient tolerated treatment well;Patient limited by pain    Behavior During Therapy [facility] for tasks assessed/performed                 Past Medical History:  Diagnosis Date   Arthritis    RA   Depression    Past Surgical History:  Procedure Laterality Date   BREAST MASS EXCISION     HIP ARTHROSCOPY Left 06/05/2021   Procedure: left hip arthroscopy with labral repair and cam debridement;  Surgeon: [name], MD;  Location: [facility] OR;  Service: Orthopedics;  Laterality: Left;   There are no problems to display for this patient.   ONSET DATE: 06/05/2021 L HIP ARTHROSCOPY WITH LABRAL REPAIR, L CAM DEBRIDEMENT, L PINCER DEBRIDEMENT   REFERRING DIAG: M25.552 (ICD-10-CM) - Left hip pain M17.11 (ICD-10-CM) - Patellofemoral arthritis of right knee    THERAPY DIAG:  Pain in joint of right knee   Pain in left hip   Muscle weakness (generalized)   Difficulty walking   SUBJECTIVE:    SUBJECTIVE STATEMENT:  Pt states her L leg feels fatigued after last session but no increased pain. She notices her L L/S and L quad "pressure" but nothing in the hip.                                                                     PERTINENT HISTORY: RA, L labral tear   PAIN:  Are you having pain? no VAS scale: 0/10 L hip Pain location: L hip incision site Pain orientation: Right and Left  PAIN TYPE: throbbing, aching Pain description: constant   Aggravating factors: postural Relieving factors: resting     THERAPY DIAG:  Pain in joint of right knee  Pain in left hip  Muscle weakness (generalized)  Difficulty walking  PERTINENT HISTORY: PERTINENT HISTORY: RA, L labral tear   PRECAUTIONS: None    OBJECTIVE:   Pt bilat axillary crutches and hip brace. No compression garments.  L HIP PROM- up to 90 deg flexion, 30 deg IR and ER, hip extension to 0 in supine  Pt still requires UE minA with L leg during transfers.   TREATMENT:  Pt received PROM of L hip to within surgical precautions as listed above.   Today:   Exercises Upright bike- 80 deg hip flexion L1 8 min  Glute set 5s 10x S/L clamshell 20x on L Supine march with TrA brace 3s 10x ( VC for not breaking 90, foot clearing table only, near full knee flexion position only)  Prone knee flexion 5lbs 3x10 LAQ 2x10 5lbs  bilat Prone quad stretch, pillow under  hips 30s 2x   PROM to limits listed above    Previous:  Exercises Hooklying Gluteal Sets -  10 reps - 5 hold Supine Quad Set  - 5 hold 10x Seated Ankle Pumps - 3-5 x daily - 7 x weekly - 2 sets - 10 reps Supine march with TrA brace 3s 10x (do not break 90, foot clearing table only, near full knee flexion position only)  Calf stretch standing 30s 3s LAQ 3x10 5lbs  bilat Prone quad stretch, pillow under hips 30s 2x PPT 10x  PROM to limits listed above. Grade I oscillation in 30 flexion and 15 deg ABD for pain management post PROM 3 mins  STM L QL      PATIENT EDUCATION:  Education details: surgical precautions/ protocol, anatomy, exercise progression, DOMS expectations, recovery times, post-surgical progression Person educated: Patient Education method: Explanation, Demonstration, Tactile cues, Verbal cues, and Handouts Education comprehension: verbalized understanding, returned demonstration, verbal cues required, and tactile cues required     HOME EXERCISE PROGRAM:    Access Code:  [email or web address] URL: [email or web address] Date: 06/12/2021 Prepared by: Zebedee Iba  Exercises Hooklying Gluteal Sets - 3-5 x daily - 7 x weekly - 2 sets - 10 reps - 5 hold Supine Quad Set - 3-5 x daily - 7 x weekly - 2 sets - 10 reps - 5 hold Seated Ankle Pumps - 3-5 x daily - 7 x weekly - 2 sets - 10 reps Supine March with Posterior Pelvic Tilt - 3-5 x daily - 7 x weekly - 2 sets - 10 reps Seated Long Arc Quad - 3-5 x daily - 7 x weekly - 2 sets - 10 reps       ASSESSMENT:   CLINICAL IMPRESSION:  Pt able to tolerate continuation of hip labral repair protocol without exacerbation of pain. Pt demonstrates good PROM within limitations and able to incorporate hip ABD activation exercise as well as light resisted HS curling. Pt with good tolerance to upright bike and report relief of hip stiffness following.  L QL spasm today likely due to hip hiking gait. Plan to continue with bike and exercise at next session. Pt would benefit from continued skilled therapy in order to reach goals and maximize functional bilat LE  strength and ROM for full return to PLOF.    REHAB POTENTIAL: Good   CLINICAL DECISION MAKING: Stable/uncomplicated   EVALUATION COMPLEXITY: Low     GOALS:     SHORT TERM GOALS:   STG Name Target Date Goal status  1 Pt will become independent with HEP in order to demonstrate synthesis of PT education.   06/28/2021  INITIAL  2 Pt will be able to demonstrate ability to perform full AROM of L LE without assistance LE function for self-care and house hold duties.   07/26/2021  INITIAL  3 Pt will be able to demonstrate ability to perform stairs and gait with no AD/UE and equal WB in order to demonstrate functional improvement in L LE function for self-care and house hold duties.     07/20/2021 INITIAL  4 Pt will have an at least 9 pt improvement in LEFS measure in order to demonstrate MCID improvement in daily function.   07/20/2021 INITIAL    LONG TERM GOALS:     LTG Name Target Date Goal status  1 Pt  will become independent with final HEP in order to demonstrate synthesis of PT education.   09/06/2021  INITIAL  2 Pt will be able to demonstrate  full floor to stand, kneeling to stand, and stand to kneeling transfer without UE support in order to demonstrate functional improvement in L LE function for ADL/house hold duties. 08/31/2021 INITIAL  3 Pt will be able to demonstrate DL squat with >/=02 lbs in order to demonstrate functional improvement in bilat LE strength for return to PLOF and exercise.   08/31/2021 INITIAL  4 Pt will have an at least 18 pt improvement in LEFS measure in order to demonstrate MCID improvement in daily function.     08/31/2021 INITIAL    PLAN: PT FREQUENCY: 1-2x/week   PT DURATION: 12 weeks   PLANNED INTERVENTIONS: Therapeutic exercises, Therapeutic activity, Neuro Muscular re-education, Balance training, Gait training, Patient/Family education, Joint mobilization, Stair training, Orthotic/Fit training, Aquatic Therapy, Dry Needling, Electrical stimulation, Spinal mobilization, Cryotherapy, Moist heat, scar mobilization, Taping, Vasopneumatic device, Ultrasound, Ionotophoresis 4mg /ml Dexamethasone, and Manual therapy   PLAN FOR NEXT SESSION: review HEP, PROM, trial heel slide with assist, STM, prone knee flexion   PT, DPT 06/14/21 10:34 AM   Patient name: DILLON MCREYNOLDS MRN: Mariane Masters DOB: 1975/12/26

## 2021-06-16 ENCOUNTER — Ambulatory Visit (INDEPENDENT_AMBULATORY_CARE_PROVIDER_SITE_OTHER): Payer: BC Managed Care – PPO | Admitting: Orthopaedic Surgery

## 2021-06-16 ENCOUNTER — Other Ambulatory Visit: Payer: Self-pay

## 2021-06-16 DIAGNOSIS — S73192A Other sprain of left hip, initial encounter: Secondary | ICD-10-CM

## 2021-06-16 DIAGNOSIS — M25852 Other specified joint disorders, left hip: Secondary | ICD-10-CM

## 2021-06-16 NOTE — Progress Notes (Signed)
Post Operative Evaluation    Procedure/Date of Surgery: 06/05/21 -left hip arthroscopy with pincer and cam debridement  Interval History:   Linda Gill presents today 2 weeks status post the above procedure.  She is overall doing extremely well.  She is quite happy with the pain in the hip at this time.  She has been compliant with brace and crutch usage.  She is having right knee pain as result of overuse due to rehab.  She has been working on the right leg as well with [employer].   PMH/PSH/Family History/Social History/Meds/Allergies:    Past Medical History:  Diagnosis Date   Arthritis    RA   Depression    Past Surgical History:  Procedure Laterality Date   BREAST MASS EXCISION     HIP ARTHROSCOPY Left 06/05/2021   Procedure: left hip arthroscopy with labral repair and cam debridement;  Surgeon: [name], MD;  Location: [facility] OR;  Service: Orthopedics;  Laterality: Left;   Social History   Socioeconomic History   Marital status: Married    Spouse name: Not on file   Number of children: Not on file   Years of education: Not on file   Highest education level: Not on file  Occupational History   Not on file  Tobacco Use   Smoking status: Never   Smokeless tobacco: Never  Vaping Use   Vaping Use: Never used  Substance and Sexual Activity   Alcohol use: Not Currently   Drug use: Never   Sexual activity: Not on file  Other Topics Concern   Not on file  Social History Narrative   Not on file   Social Determinants of Health   Financial Resource Strain: Not on file  Food Insecurity: Not on file  Transportation Needs: Not on file  Physical Activity: Not on file  Stress: Not on file  Social Connections: Not on file   No family history on file. Allergies  Allergen Reactions   Moxifloxacin Rash   Penicillins Rash    Tolerated Ancef 2g IV on 06/05/2021   Shellfish Allergy Rash    Not allergic to iodine   Sulfa Antibiotics Rash    Current Outpatient Medications  Medication Sig Dispense Refill   Ascorbic Acid (VITAMIN C) 1000 MG tablet Take 1,000 mg by mouth daily.     aspirin EC 325 MG tablet Take 1 tablet (325 mg total) by mouth daily. 30 tablet 0   Cholecalciferol (VITAMIN D3 PO) Take 7,000 Units by mouth daily.     FLUoxetine (PROZAC) 20 MG capsule Take 20 mg by mouth daily.     ibuprofen (ADVIL) 800 MG tablet Take 800 mg by mouth every 8 (eight) hours as needed.     meloxicam (MOBIC) 15 MG tablet Take 15 mg by mouth every morning.     montelukast (SINGULAIR) 10 MG tablet Take 10 mg by mouth daily.     norethindrone-ethinyl estradiol-FE (LOESTRIN FE) 1-20 MG-MCG tablet Take 1 tablet by mouth daily.     oxyCODONE (OXY IR/ROXICODONE) 5 MG immediate release tablet Take 1 tablet (5 mg total) by mouth every 4 (four) hours as needed for severe pain. 30 tablet 0   oxycodone (OXY-IR) 5 MG capsule Take 1 capsule (5 mg total) by mouth every 4 (four) hours as needed (severe pain). 20 capsule 0  vitamin B-12 (CYANOCOBALAMIN) 1000 MCG tablet Take 2,000 mcg by mouth daily.     vitamin E 180 MG (400 UNITS) capsule Take 400 Units by mouth daily.     zinc gluconate 50 MG tablet Take 50 mg by mouth daily.     No current facility-administered medications for this visit.   No results found.  Review of Systems:   A ROS was performed including pertinent positives and negatives as documented in the HPI.   Musculoskeletal Exam:    There were no vitals taken for this visit.  Left hip incisions are clean dry and intact without erythema or drainage.  She is able to extend and flex at the knee she is able to dorsiflex and plantarflex the left foot.  Sensation is intact in all distributions of the left leg.  2+ dorsalis pedis pulse.  Walks with a mildly antalgic gait utilizing crutches.  Imaging:    none  I personally reviewed and interpreted the radiographs.   Assessment:   45 year old female 2-week status post left hip  arthroscopy with cam and pincer debridement as well as labral repair, overall is doing extremely well.  She was advised that she may be off crutches to weight-bear as tolerated at this time.  Plan :    -She will advance per protocol -Return to clinic in 1 month    I personally saw and evaluated the patient, and participated in the management and treatment plan.  [name], MD Attending Physician, Orthopedic Surgery  This document was dictated using Dragon voice recognition software. A reasonable attempt at proof reading has been made to minimize errors.

## 2021-06-19 ENCOUNTER — Encounter (HOSPITAL_BASED_OUTPATIENT_CLINIC_OR_DEPARTMENT_OTHER): Payer: Self-pay | Admitting: Physical Therapy

## 2021-06-19 ENCOUNTER — Ambulatory Visit (HOSPITAL_BASED_OUTPATIENT_CLINIC_OR_DEPARTMENT_OTHER): Payer: BC Managed Care – PPO | Admitting: Physical Therapy

## 2021-06-19 ENCOUNTER — Other Ambulatory Visit: Payer: Self-pay

## 2021-06-19 DIAGNOSIS — M25552 Pain in left hip: Secondary | ICD-10-CM

## 2021-06-19 DIAGNOSIS — R262 Difficulty in walking, not elsewhere classified: Secondary | ICD-10-CM

## 2021-06-19 DIAGNOSIS — M6281 Muscle weakness (generalized): Secondary | ICD-10-CM

## 2021-06-19 DIAGNOSIS — M25561 Pain in right knee: Secondary | ICD-10-CM

## 2021-06-19 NOTE — Therapy (Signed)
OUTPATIENT PHYSICAL THERAPY TREATMENT NOTE   Patient Name: Linda Gill MRN: [id number] DOB:1976/03/16, 45 y.o., female Today's Date: 06/19/2021  PCP: Tracey Harries, MD REFERRING PROVIDER: Merton Border, MD   PT End of Session - 06/19/21 1105     Visit Number 9    Number of Visits 21    Date for PT Re-Evaluation 09/06/21    Authorization Type [employer]    PT Start Time 1105    PT Stop Time 1145    PT Time Calculation (min) 40 min    Equipment Utilized During Treatment Other (comment)   crutches and brace   Activity Tolerance Patient tolerated treatment well;Patient limited by pain    Behavior During Therapy WFL for tasks assessed/performed                  Past Medical History:  Diagnosis Date   Arthritis    RA   Depression    Past Surgical History:  Procedure Laterality Date   BREAST MASS EXCISION     HIP ARTHROSCOPY Left 06/05/2021   Procedure: left hip arthroscopy with labral repair and cam debridement;  Surgeon: [name], MD;  Location: [facility] OR;  Service: Orthopedics;  Laterality: Left;   There are no problems to display for this patient.   ONSET DATE: 06/05/2021 L HIP ARTHROSCOPY WITH LABRAL REPAIR, L CAM DEBRIDEMENT, L PINCER DEBRIDEMENT   REFERRING DIAG: M25.552 (ICD-10-CM) - Left hip pain M17.11 (ICD-10-CM) - Patellofemoral arthritis of right knee    THERAPY DIAG:  Pain in joint of right knee   Pain in left hip   Muscle weakness (generalized)   Difficulty walking   SUBJECTIVE:    SUBJECTIVE STATEMENT:  Pt states she is doing well. She is no longer using crutches but was told to keep on the brace at PT discretion. Pt states the L achilles has been bothersome recently.    PERTINENT HISTORY: RA, L labral tear   PAIN:  Are you having pain? no VAS scale: 0/10 L hip Pain location: L hip incision site Pain orientation: Right and Left  PAIN TYPE: throbbing, aching Pain description: constant  Aggravating factors: postural Relieving  factors: resting     THERAPY DIAG:  Pain in joint of right knee  Pain in left hip  Muscle weakness (generalized)  Difficulty walking  PERTINENT HISTORY: PERTINENT HISTORY: RA, L labral tear   PRECAUTIONS: None    OBJECTIVE:   Pt bilat axillary crutches and hip brace. No compression garments.  L HIP PROM- up to 90 deg flexion, 30 deg IR and ER, hip extension to 0 in supine  Pt still requires UE minA with L leg during transfers.   TREATMENT:  Pt received PROM of L hip to within surgical precautions as listed above.    TODAY 10/31:   Exercises Upright bike- 80 deg hip flexion L1 8 min   Heel toe rocking- focus on heel strike and toe off 20x Standing HR 2x10, equal WB, iso hold 5s  S/L clamshell 2x10 on L; 1lb  Prone knee flexion 10lbs 3x10 LAQ 3x10 10lbs  bilat Bridge with ABD and ADD 20x each- pink ball and GTB Heel toe rocking 3x10- 1 with UE, and 2 without   Previous:   Exercises Upright bike- 80 deg hip flexion L1 8 min  Glute set 5s 10x S/L clamshell 20x on L Supine march with TrA brace 3s 10x ( VC for not breaking 90, foot clearing table only, near full knee flexion position  only)  Prone knee flexion 5lbs 3x10 LAQ 2x10 5lbs  bilat Prone quad stretch, pillow under hips 30s 2x   PROM to limits listed above      PATIENT EDUCATION:  Education details: surgical precautions/ protocol, anatomy, exercise progression, DOMS expectations, recovery times, post-surgical progression Person educated: Patient Education method: Explanation, Demonstration, Tactile cues, Verbal cues, and Handouts Education comprehension: verbalized understanding, returned demonstration, verbal cues required, and tactile cues required     HOME EXERCISE PROGRAM:   Access Code: [email or web address] URL: [email or web address] Date: 06/19/2021 Prepared by: Zebedee Iba  Exercises Seated Ankle Pumps - 3-5 x daily - 7 x weekly - 2 sets - 10 reps Supine March with Posterior  Pelvic Tilt - 3-5 x daily - 7 x weekly - 2 sets - 10 reps Seated Long Arc Quad - 3-5 x daily - 7 x weekly - 2 sets - 10 reps Standing Gastroc Stretch on Step - 2 x daily - 7 x weekly - 1 sets - 3 reps - 30 hold Bridge with Hip Abduction and Resistance - 2 x daily - 7 x weekly - 1 sets - 20 reps Supine Bridge with Mini Swiss Ball Between Knees - 2 x daily - 7 x weekly - 1 sets - 20 reps        ASSESSMENT:   CLINICAL IMPRESSION:  Pt tolerated WB focus exercises and increased resistance with hip strengthening without pain. Clams did need to be performed with 1lb ankle weight rather than resistance band due to the pinching sensation within the hip. Pt demonstrated L LE offloading during gait and required VC for equal WB and heel strike/toe off. Pt Achilles pain likely due to previous overloading due to PWB status. Plan to continue with progressed exercise for 21 days until next phase of rehab. Pt is 2 weeks out at this point. Pt would benefit from continued skilled therapy in order to reach goals and maximize functional bilat LE  strength and ROM for full return to PLOF.    REHAB POTENTIAL: Good   CLINICAL DECISION MAKING: Stable/uncomplicated   EVALUATION COMPLEXITY: Low     GOALS:     SHORT TERM GOALS:   STG Name Target Date Goal status  1 Pt will become independent with HEP in order to demonstrate synthesis of PT education.   07/03/2021  INITIAL  2 Pt will be able to demonstrate ability to perform full AROM of L LE without assistance LE function for self-care and house hold duties.   07/31/2021  INITIAL  3 Pt will be able to demonstrate ability to perform stairs and gait with no AD/UE and equal WB in order to demonstrate functional improvement in L LE function for self-care and house hold duties.     07/20/2021 INITIAL  4 Pt will have an at least 9 pt improvement in LEFS measure in order to demonstrate MCID improvement in daily function.   07/20/2021 INITIAL    LONG TERM GOALS:     LTG Name Target Date Goal status  1 Pt  will become independent with final HEP in order to demonstrate synthesis of PT education.   09/11/2021  INITIAL  2 Pt will be able to demonstrate full floor to stand, kneeling to stand, and stand to kneeling transfer without UE support in order to demonstrate functional improvement in L LE function for ADL/house hold duties. 08/31/2021 INITIAL  3 Pt will be able to demonstrate DL squat with >/=78 lbs in order to demonstrate functional improvement in bilat LE  strength for return to PLOF and exercise.   08/31/2021 INITIAL  4 Pt will have an at least 18 pt improvement in LEFS measure in order to demonstrate MCID improvement in daily function.     08/31/2021 INITIAL    PLAN: PT FREQUENCY: 1-2x/week   PT DURATION: 12 weeks   PLANNED INTERVENTIONS: Therapeutic exercises, Therapeutic activity, Neuro Muscular re-education, Balance training, Gait training, Patient/Family education, Joint mobilization, Stair training, Orthotic/Fit training, Aquatic Therapy, Dry Needling, Electrical stimulation, Spinal mobilization, Cryotherapy, Moist heat, scar mobilization, Taping, Vasopneumatic device, Ultrasound, Ionotophoresis 4mg /ml Dexamethasone, and Manual therapy   PLAN FOR NEXT SESSION: review of previous exercise   PT, DPT 06/19/21 11:47 AM   Patient name: EZINNE YOGI MRN: Mariane Masters DOB: March 30, 1976

## 2021-06-20 ENCOUNTER — Encounter (HOSPITAL_BASED_OUTPATIENT_CLINIC_OR_DEPARTMENT_OTHER): Payer: BC Managed Care – PPO | Admitting: Orthopaedic Surgery

## 2021-06-20 ENCOUNTER — Other Ambulatory Visit: Payer: Self-pay

## 2021-06-20 ENCOUNTER — Observation Stay (HOSPITAL_COMMUNITY)
Admission: EM | Admit: 2021-06-20 | Discharge: 2021-06-23 | Disposition: A | Discharge status: Home / Self Care | Payer: BC Managed Care – PPO | Attending: Internal Medicine | Admitting: Internal Medicine

## 2021-06-20 ENCOUNTER — Emergency Department (HOSPITAL_BASED_OUTPATIENT_CLINIC_OR_DEPARTMENT_OTHER): Payer: BC Managed Care – PPO

## 2021-06-20 ENCOUNTER — Encounter (HOSPITAL_COMMUNITY): Payer: Self-pay | Admitting: Internal Medicine

## 2021-06-20 DIAGNOSIS — M79662 Pain in left lower leg: Secondary | ICD-10-CM | POA: Diagnosis not present

## 2021-06-20 DIAGNOSIS — Z20822 Contact with and (suspected) exposure to covid-19: Secondary | ICD-10-CM | POA: Diagnosis not present

## 2021-06-20 DIAGNOSIS — Z7982 Long term (current) use of aspirin: Secondary | ICD-10-CM | POA: Insufficient documentation

## 2021-06-20 DIAGNOSIS — M79661 Pain in right lower leg: Secondary | ICD-10-CM | POA: Diagnosis not present

## 2021-06-20 DIAGNOSIS — I82463 Acute embolism and thrombosis of calf muscular vein, bilateral: Secondary | ICD-10-CM | POA: Diagnosis not present

## 2021-06-20 DIAGNOSIS — Z9889 Other specified postprocedural states: Secondary | ICD-10-CM

## 2021-06-20 DIAGNOSIS — I2699 Other pulmonary embolism without acute cor pulmonale: Secondary | ICD-10-CM | POA: Diagnosis not present

## 2021-06-20 DIAGNOSIS — R0602 Shortness of breath: Secondary | ICD-10-CM

## 2021-06-20 DIAGNOSIS — I82409 Acute embolism and thrombosis of unspecified deep veins of unspecified lower extremity: Secondary | ICD-10-CM | POA: Diagnosis present

## 2021-06-20 DIAGNOSIS — Z86711 Personal history of pulmonary embolism: Secondary | ICD-10-CM

## 2021-06-20 HISTORY — DX: Personal history of pulmonary embolism: Z86.711

## 2021-06-20 LAB — CBC WITH DIFFERENTIAL/PLATELET
Abs Immature Granulocytes: 0.05 10*3/uL (ref 0.00–0.07)
Basophils Absolute: 0 10*3/uL (ref 0.0–0.1)
Basophils Relative: 0 %
Eosinophils Absolute: 0.2 10*3/uL (ref 0.0–0.5)
Eosinophils Relative: 2 %
HCT: 37.5 % (ref 36.0–46.0)
Hemoglobin: 12.6 g/dL (ref 12.0–15.0)
Immature Granulocytes: 0 %
Lymphocytes Relative: 43 %
Lymphs Abs: 5.4 10*3/uL — ABNORMAL HIGH (ref 0.7–4.0)
MCH: 29.9 pg (ref 26.0–34.0)
MCHC: 33.6 g/dL (ref 30.0–36.0)
MCV: 88.9 fL (ref 80.0–100.0)
Monocytes Absolute: 0.5 10*3/uL (ref 0.1–1.0)
Monocytes Relative: 4 %
Neutro Abs: 6.4 10*3/uL (ref 1.7–7.7)
Neutrophils Relative %: 51 %
Platelets: 282 10*3/uL (ref 150–400)
RBC: 4.22 MIL/uL (ref 3.87–5.11)
RDW: 12.9 % (ref 11.5–15.5)
WBC: 12.7 10*3/uL — ABNORMAL HIGH (ref 4.0–10.5)
nRBC: 0 % (ref 0.0–0.2)

## 2021-06-20 LAB — BASIC METABOLIC PANEL
Anion gap: 9 (ref 5–15)
BUN: 8 mg/dL (ref 6–20)
CO2: 23 mmol/L (ref 22–32)
Calcium: 8.6 mg/dL — ABNORMAL LOW (ref 8.9–10.3)
Chloride: 105 mmol/L (ref 98–111)
Creatinine, Ser: 0.61 mg/dL (ref 0.44–1.00)
GFR, Estimated: >60 mL/min (ref >60)
Glucose, Bld: 88 mg/dL (ref 70–99)
Potassium: 4.1 mmol/L (ref 3.5–5.1)
Sodium: 137 mmol/L (ref 135–145)

## 2021-06-20 LAB — RESP PANEL BY RT-PCR (FLU A&B, COVID) ARPGX2
Influenza A by PCR: NEGATIVE
Influenza B by PCR: NEGATIVE
SARS Coronavirus 2 by RT PCR: NEGATIVE

## 2021-06-20 LAB — PROTIME-INR
INR: 1 (ref 0.8–1.2)
Prothrombin Time: 13.3 seconds (ref 11.4–15.2)

## 2021-06-20 LAB — TROPONIN I (HIGH SENSITIVITY)
Troponin I (High Sensitivity): <2 ng/L (ref <18)
Troponin I (High Sensitivity): <2 ng/L (ref <18)

## 2021-06-20 MED ORDER — HEPARIN (PORCINE) 25000 UT/250ML-% IV SOLN
2100.0000 [IU]/h | INTRAVENOUS | Status: DC
  Administered 2021-06-20: 1700 [IU]/h via INTRAVENOUS
  Administered 2021-06-21 – 2021-06-22 (×2): 2100 [IU]/h via INTRAVENOUS
  Filled 2021-06-20 (×5): qty 250

## 2021-06-20 MED ORDER — HEPARIN BOLUS VIA INFUSION
7500.0000 [IU] | Freq: Once | INTRAVENOUS | Status: AC
  Administered 2021-06-20: 7500 [IU] via INTRAVENOUS
  Filled 2021-06-20: qty 7500

## 2021-06-20 NOTE — ED Triage Notes (Signed)
Pt from PCP for tx of bilateral PE's on CTA found this morning. Had surgery on hip last week and has been shob since Thursday.

## 2021-06-20 NOTE — Progress Notes (Signed)
Lower extremity venous bilateral study completed.  Preliminary results relayed to [place], [place].  See CV Proc for preliminary results report.   Tesean Stump, RDMS, RVT  

## 2021-06-20 NOTE — H&P (Signed)
Linda Gill:[id number] DOB: 1976-06-07 DOA: 06/20/2021   PCP: Tracey Harries, MD   Outpatient Specialists:   Orthopedics Dr. Steward Drone.   Patient arrived to ER on 06/20/21 at 1422 Referred by Attending Wynetta Fines, MD;Dou*   Patient coming from: home Lives  With family    Chief Complaint:   Chief Complaint  Patient presents with   Shortness of Breath    HPI: Linda Gill is a 45 y.o. female with medical history significant of Ra recent hip surgery for Labral tear     Presented with  shortness of breath for the past 6 days, recent hip surgery decreased mobility presented to her primary care provider CTA obtained shows bilateral PE no heart strain unfortunately imaging studies not available in our system  patient was told to come to emergency by No prior history of PEs or blood clots Hip surgery was done on 05 June 2021 has been on DVT prevention with aspirin 325  She is currently on Loestrin. No associated fever chills  Reports minimal bilateral leg pain  She felt flu like symptoms 4 days ago  followed by [facility] accasional  No tobacco  No travel  No family hx of  blood clots  Has  been vaccinated against COVID  and boosted   Initial COVID TEST  NEGATIVE   Lab Results  Component Value Date   SARSCOV2NAA NEGATIVE 06/20/2021   SARSCOV2NAA Not Detected 06/22/2019    Regarding pertinent Chronic problems:   History of depression on Prozac   While in ER:  Bilateral Dopplers done showing bilateral calf DVTs Started on heparin   ED Triage Vitals  Enc Vitals Group     BP 06/20/21 1433 (!) 146/98     Pulse Rate 06/20/21 1433 82     Resp 06/20/21 1433 18     Temp 06/20/21 1433 98 F (36.7 C)     Temp src --      SpO2 06/20/21 1433 97 %     Weight 06/20/21 1552 253 lb (114.8 kg)     Height 06/20/21 1552  (1.88 m)     Head Circumference --      Peak Flow --      Pain Score 06/20/21 1435 4     Pain Loc --      Pain Edu? --      Excl. in  GC? --   TMAX(24)@     _________________________________________ Significant initial  Findings: Abnormal Labs Reviewed  CBC WITH DIFFERENTIAL/PLATELET - Abnormal; Notable for the following components:      Result Value   WBC 12.7 (*)    Lymphs Abs 5.4 (*)    All other components within normal limits  BASIC METABOLIC PANEL - Abnormal; Notable for the following components:   Calcium 8.6 (*)    All other components within normal limits   ____________________________________________ Ordered  CTA chest -  PE bilateral as per report from primary   _________________________ Troponin less than 2 ECG: Ordered Personally reviewed by me showing: HR : 87 Rhythm: NSR,   no evidence of ischemic changes QTC 450 _____________  The recent clinical data is shown below. Vitals:   06/20/21 1715 06/20/21 1730 06/20/21 1745 06/20/21 1815  BP: 113/72 (!) 110/92 116/66 110/69  Pulse: 79 83 72 73  Resp: 14 (!) 22 (!) 25 17  Temp:      SpO2: 96% 95% 97% 95%  Weight:      Height:  WBC     Component Value Date/Time   WBC 12.7 (H) 06/20/2021 1509   LYMPHSABS 5.4 (H) 06/20/2021 1509   MONOABS 0.5 06/20/2021 1509   EOSABS 0.2 06/20/2021 1509   BASOSABS 0.0 06/20/2021 1509      UA ordered     Results for orders placed or performed in visit on 06/22/19  Novel Coronavirus, NAA ([facility])     Status: None   Collection Time: 06/22/19  8:25 AM   Specimen: Nasopharyngeal(NP) swabs in vial transport medium   NASOPHARYNGE  TESTING  Result Value Ref Range Status   SARS-CoV-2, NAA Not Detected Not Detected Final         _______________________________________________ Hospitalist was called for admission for bilateral PEs  The following Work up has been ordered so far:  Orders Placed This Encounter  Procedures   Critical Care   Resp Panel by RT-PCR (Flu A&B, Covid) Nasopharyngeal Swab   CBC with Differential   Basic metabolic panel   Heparin level (unfractionated)   Heparin level  (unfractionated)   Protime-INR   Height and weight   Cardiac monitoring   heparin per pharmacy consult   Consult for [facility] Admission   ED EKG   Place in observation (patient's expected length of stay will be less than 2 midnights)   VAS [place] LOWER EXTREMITY VENOUS (DVT) (ONLY [facility] & [facility])    Following Medications were ordered in ER: Medications  heparin ADULT infusion 100 units/mL (25000 units/268mL) (1,700 Units/hr Intravenous New Bag/Given 06/20/21 1730)  heparin bolus via infusion 7,500 Units (7,500 Units Intravenous Bolus from Bag 06/20/21 1731)      OTHER Significant initial  Findings:  labs showing:   Recent Labs  Lab 06/20/21 1509  NA 137  K 4.1  CO2 23  GLUCOSE 88  BUN 8  CREATININE 0.61  CALCIUM 8.6*    Cr   stable,   Lab Results  Component Value Date   CREATININE 0.61 06/20/2021   CREATININE 0.62 05/18/2021    No results for input(s): AST, ALT, ALKPHOS, BILITOT, PROT, ALBUMIN in the last 168 hours. Lab Results  Component Value Date   CALCIUM 8.6 (L) 06/20/2021    Plt: Lab Results  Component Value Date   PLT 282 06/20/2021    COVID-19 Labs  No results for input(s): DDIMER, FERRITIN, LDH, CRP in the last 72 hours.  Lab Results  Component Value Date   SARSCOV2NAA Not Detected 06/22/2019       Recent Labs  Lab 06/20/21 1509  WBC 12.7*  NEUTROABS 6.4  HGB 12.6  HCT 37.5  MCV 88.9  PLT 282    HG/HCT stable,      Component Value Date/Time   HGB 12.6 06/20/2021 1509   HCT 37.5 06/20/2021 1509   MCV 88.9 06/20/2021 1509      Cultures: No results found for: SDES, SPECREQUEST, CULT, REPTSTATUS   Radiological Exams on Admission: VAS [place] LOWER EXTREMITY VENOUS (DVT) (ONLY [facility])  Result Date: 06/20/2021  Lower Venous DVT Study Patient Name:  Linda Gill  Date of Exam:   06/20/2021 Medical Rec #: [id number]         Accession #:    [id number] Date of Birth: 10/06/1975         Patient Gender: F Patient Age:   47 years Exam Location:   [facility] Procedure:      VAS [place] LOWER EXTREMITY VENOUS (DVT) Referring Phys: Claudette Stapler --------------------------------------------------------------------------------  Indications: Pulmonary embolism,  SOB, and bilateral calf pain s/p hip surgery.  Risk Factors: Surgery 06-05-2021 Left hip arthrosopy. Anticoagulation: Started on heparin 06-20-2021. Comparison Study: 06-20-2021 CTA of the chest performed at [facility]:                   Bilateral pulmonary embolism is present. The most proximal                   clot is near the origin of the lower arteries to the right                   upper lobe and right lower lobe. Clot extends into some of the                   segmental or subsegmental branch vessels, overall clot burden                   moderate. The left upper lobe and the right middle lobe appear                   relatively spared. Main pulmonary artery is normal in                   diameter. Performing Technologist: Jean Rosenthal RDMS, RVT  Examination Guidelines: A complete evaluation includes B-mode imaging, spectral Doppler, color Doppler, and power Doppler as needed of all accessible portions of each vessel. Bilateral testing is considered an integral part of a complete examination. Limited examinations for reoccurring indications may be performed as noted. The reflux portion of the exam is performed with the patient in reverse Trendelenburg.  +---------+---------------+---------+-----------+----------+--------------+ RIGHT    CompressibilityPhasicitySpontaneityPropertiesThrombus Aging +---------+---------------+---------+-----------+----------+--------------+ CFV      Full           Yes      Yes                                 +---------+---------------+---------+-----------+----------+--------------+ SFJ      Full                                                        +---------+---------------+---------+-----------+----------+--------------+ FV Prox   Full                                                        +---------+---------------+---------+-----------+----------+--------------+ FV Mid   Full                                                        +---------+---------------+---------+-----------+----------+--------------+ FV DistalFull                                                        +---------+---------------+---------+-----------+----------+--------------+ PFV      Full                                                        +---------+---------------+---------+-----------+----------+--------------+  POP      Full           Yes      Yes                                 +---------+---------------+---------+-----------+----------+--------------+ PTV      None           No       No                   Acute          +---------+---------------+---------+-----------+----------+--------------+ PERO     Full                                                        +---------+---------------+---------+-----------+----------+--------------+ Gastroc  Full                                                        +---------+---------------+---------+-----------+----------+--------------+   +---------+---------------+---------+-----------+----------+--------------+ LEFT     CompressibilityPhasicitySpontaneityPropertiesThrombus Aging +---------+---------------+---------+-----------+----------+--------------+ CFV      Full           Yes      Yes                                 +---------+---------------+---------+-----------+----------+--------------+ SFJ      Full                                                        +---------+---------------+---------+-----------+----------+--------------+ FV Prox  Full                                                        +---------+---------------+---------+-----------+----------+--------------+ FV Mid   Full                                                         +---------+---------------+---------+-----------+----------+--------------+ FV DistalFull                                                        +---------+---------------+---------+-----------+----------+--------------+ PFV      Full                                                        +---------+---------------+---------+-----------+----------+--------------+  POP      Full           Yes      Yes                                 +---------+---------------+---------+-----------+----------+--------------+ PTV      None           No       No                   Acute          +---------+---------------+---------+-----------+----------+--------------+ PERO     Full                                                        +---------+---------------+---------+-----------+----------+--------------+ Gastroc  Full                                                        +---------+---------------+---------+-----------+----------+--------------+     Summary: RIGHT: - Findings consistent with acute deep vein thrombosis involving the right posterior tibial veins.  LEFT: - Findings consistent with acute deep vein thrombosis involving the left posterior tibial veins.  *See table(s) above for measurements and observations.    Preliminary    _______________________________________________________________________________________________________ Latest  Blood pressure 110/69, pulse 73, temperature 98 F (36.7 C), resp. rate 17, height  (1.88 m), weight 114.8 kg, SpO2 95 %.   Review of Systems:    Pertinent positives include:  shortness of breath at rest.  dyspnea on exertion,   Constitutional:  No weight loss, night sweats, Fevers, chills, fatigue, weight loss  HEENT:  No headaches, Difficulty swallowing,Tooth/dental problems,Sore throat,  No sneezing, itching, ear ache, nasal congestion, post nasal drip,  Cardio-vascular:  No chest pain, Orthopnea, PND,  anasarca, dizziness, palpitations.no Bilateral lower extremity swelling  GI:  No heartburn, indigestion, abdominal pain, nausea, vomiting, diarrhea, change in bowel habits, loss of appetite, melena, blood in stool, hematemesis Resp:   No excess mucus, no productive cough, No non-productive cough, No coughing up of blood.No change in color of mucus.No wheezing. Skin:  no rash or lesions. No jaundice GU:  no dysuria, change in color of urine, no urgency or frequency. No straining to urinate.  No flank pain.  Musculoskeletal:  No joint pain or no joint swelling. No decreased range of motion. No back pain.  Psych:  No change in mood or affect. No depression or anxiety. No memory loss.  Neuro: no localizing neurological complaints, no tingling, no weakness, no double vision, no gait abnormality, no slurred speech, no confusion  All systems reviewed and apart from HOPI all are negative _______________________________________________________________________________________________ Past Medical History:   Past Medical History:  Diagnosis Date   Arthritis    RA   Depression      Past Surgical History:  Procedure Laterality Date   BREAST MASS EXCISION     HIP ARTHROSCOPY Left 06/05/2021   Procedure: left hip arthroscopy with labral repair and cam debridement;  Surgeon: [name], MD;  Location: [facility] OR;  Service: Orthopedics;  Laterality: Left;    Social  History:  Ambulatory   independently    reports that she has never smoked. She has never used smokeless tobacco. She reports that she does not currently use alcohol. She reports that she does not use drugs.     Family History:  Family History  Problem Relation Age of Onset   CAD Father    Diabetes Father    Clotting disorder Neg Hx    ______________________________________________________________________________________________ Allergies: Allergies  Allergen Reactions   Moxifloxacin Rash   Penicillins Rash    Tolerated  Ancef 2g IV on 06/05/2021   Shellfish Allergy Rash    Not allergic to iodine   Sulfa Antibiotics Rash     Prior to Admission medications   Medication Sig Start Date End Date Taking? Authorizing Provider  Ascorbic Acid (VITAMIN C) 1000 MG tablet Take 1,000 mg by mouth daily.    [provider]  aspirin EC 325 MG tablet Take 1 tablet (325 mg total) by mouth daily. 05/23/21   [name], MD  Cholecalciferol (VITAMIN D3 PO) Take 7,000 Units by mouth daily.    [provider]  FLUoxetine (PROZAC) 20 MG capsule Take 20 mg by mouth daily.    [provider]  ibuprofen (ADVIL) 800 MG tablet Take 800 mg by mouth every 8 (eight) hours as needed.    [provider]  meloxicam (MOBIC) 15 MG tablet Take 15 mg by mouth every morning.    [provider]  montelukast (SINGULAIR) 10 MG tablet Take 10 mg by mouth daily. 03/11/17   [provider]  norethindrone-ethinyl estradiol-FE (LOESTRIN FE) 1-20 MG-MCG tablet Take 1 tablet by mouth daily.    [provider]  oxyCODONE (OXY IR/ROXICODONE) 5 MG immediate release tablet Take 1 tablet (5 mg total) by mouth every 4 (four) hours as needed for severe pain. 05/25/21   [name], MD  oxycodone (OXY-IR) 5 MG capsule Take 1 capsule (5 mg total) by mouth every 4 (four) hours as needed (severe pain). 05/23/21   [name], MD  vitamin B-12 (CYANOCOBALAMIN) 1000 MCG tablet Take 2,000 mcg by mouth daily.    [provider]  vitamin E 180 MG (400 UNITS) capsule Take 400 Units by mouth daily.    [provider]  zinc gluconate 50 MG tablet Take 50 mg by mouth daily.    [provider]    ___________________________________________________________________________________________________ Physical Exam: Vitals with BMI 06/20/2021 06/20/2021 06/20/2021  Height - - -  Weight - - -  BMI - - -  Systolic 110 116 076  Diastolic 69 66 92  Pulse 73 72 83    1. General:  in No   Acute distress   well   -appearing 2. Psychological: Alert and   Oriented 3. Head/ENT:    Dry Mucous Membranes                          Head Non traumatic, neck supple                          Poor Dentition 4. SKIN:  decreased Skin turgor,  Skin clean Dry and intact no rash 5. Heart: Regular rate and rhythm no  Murmur, no Rub or gallop 6. Lungs:  Clear to auscultation bilaterally, no wheezes or crackles   7. Abdomen: Soft,  non-tender, Non distended   obese  bowel sounds present 8. Lower extremities: no clubbing, cyanosis, no  edema  9. Neurologically Grossly intact, moving all 4 extremities equally   10. MSK: Normal range of motion limited in hip    Chart has been reviewed _______________________________________________________________________  Assessment/Plan 45 y.o. female with medical history significant of Ra recent hip surgery for Labral tear   Admitted for PE bilateral and DVT   Present on Admission:  Pulmonary embolism (HCC) -  Admit to   telemetry   Initiate heparin drip  Would likely benefit from case manager consult for long term anticoagulation Hold home blood pressure medications avoid hypotension  cardiac enzymes neg Order echogram and lower extremity Dopplers  Most likely risk factors for hypercoagulable state being  obesity    hormonal therapy   lifestyle recent surgery    DVT (deep venous thrombosis) (HCC) - on heparin, bedrest for 12 h   Hx of Depression - Prozac  Other plan as per orders.  DVT prophylaxis: Heparin IV    Code Status:    Code Status: Not on file FULL CODE  as per patient    I had personally discussed CODE STATUS with patient     Family Communication:   Family not at  Bedside    Disposition Plan:     To home once workup is complete and patient is stable   Following barriers for discharge:                            Electrolytes corrected                             Transition to p.o. anticoagulation                             Pain  controlled with PO medications                     Transition of care consulted                                        Consults called: None  Admission status:  ED Disposition     ED Disposition  Admit   Condition  --   Comment  Hospital Area: [facility] [[id number]]  Level of Care: Telemetry Medical [[id number]]  May place patient in observation at [facility] or [place] if equivalent level of care is available:: No  Covid Evaluation: Asymptomatic Screening Protocol (No Symptoms)  Diagnosis: Pulmonary embolism (HCC) [241700]  Admitting Physician: Therisa Doyne [[id number]]  Attending Physician: Therisa Doyne [[id number]]           Obs    Level of care     tele  indefinitely please discontinue once patient no longer qualifies COVID-19 Labs    Lab Results  Component Value Date   SARSCOV2NAA Not Detected 06/22/2019     Precautions: admitted as   Covid Negative     PPE: Used by the provider:   P100  eye Goggles,  Gloves    Sharlee Rufino 06/20/2021, 9:57 PM    [facility]     after 2 AM please page floor coverage PA If 7AM-7PM, please contact the day team taking care of the patient using [email or web address]   Patient was evaluated in the context of the global COVID-19 pandemic, which necessitated consideration  that the patient might be at risk for infection with the SARS-CoV-2 virus that causes COVID-19. Institutional protocols and algorithms that pertain to the evaluation of patients at risk for COVID-19 are in a state of rapid change based on information released by regulatory bodies including the CDC and federal and state organizations. These policies and algorithms were followed during the patient's care.

## 2021-06-20 NOTE — ED Provider Notes (Signed)
[facility] EMERGENCY DEPARTMENT Provider Note   CSN: [id number] Arrival date & time: 06/20/21  1422     History Chief Complaint  Patient presents with   Shortness of Breath    Linda Gill is a 45 y.o. female with a past medical history significant for RA and depression who presents to the ED due to abnormal CTA.  Patient had a left hip arthroscopy on 10/17 with Dr. Steward Drone. She developed sudden shortness of breath 4 days ago associated with "chest heaviness".  Chest heaviness worse with deep inspiration.  She also endorses left lower extremity pain around the calf.  Denies lower extremity edema.  No previous history of DVT/PE.  She is currently on Loestrin.  No fever or chills.  Denies cough.  Endorses intermittent headaches and lightheadedness.  Patient had a CTA performed earlier today which demonstrates bilateral pulmonary embolus without evidence of right heart strain.  No treatment prior to arrival.  She is not currently any blood thinners.  History obtained from patient and past medical records. No interpreter used during encounter.         Past Medical History:  Diagnosis Date   Arthritis    RA   Depression     There are no problems to display for this patient.   Past Surgical History:  Procedure Laterality Date   BREAST MASS EXCISION     HIP ARTHROSCOPY Left 06/05/2021   Procedure: left hip arthroscopy with labral repair and cam debridement;  Surgeon: [name], MD;  Location: [facility] OR;  Service: Orthopedics;  Laterality: Left;     OB History   No obstetric history on file.     No family history on file.  Social History   Tobacco Use   Smoking status: Never   Smokeless tobacco: Never  Vaping Use   Vaping Use: Never used  Substance Use Topics   Alcohol use: Not Currently   Drug use: Never    Home Medications Prior to Admission medications   Medication Sig Start Date End Date Taking? Authorizing Provider  Ascorbic Acid  (VITAMIN C) 1000 MG tablet Take 1,000 mg by mouth daily.    [provider]  aspirin EC 325 MG tablet Take 1 tablet (325 mg total) by mouth daily. 05/23/21   [name], MD  Cholecalciferol (VITAMIN D3 PO) Take 7,000 Units by mouth daily.    [provider]  FLUoxetine (PROZAC) 20 MG capsule Take 20 mg by mouth daily.    [provider]  ibuprofen (ADVIL) 800 MG tablet Take 800 mg by mouth every 8 (eight) hours as needed.    [provider]  meloxicam (MOBIC) 15 MG tablet Take 15 mg by mouth every morning.    [provider]  montelukast (SINGULAIR) 10 MG tablet Take 10 mg by mouth daily. 03/11/17   [provider]  norethindrone-ethinyl estradiol-FE (LOESTRIN FE) 1-20 MG-MCG tablet Take 1 tablet by mouth daily.    [provider]  oxyCODONE (OXY IR/ROXICODONE) 5 MG immediate release tablet Take 1 tablet (5 mg total) by mouth every 4 (four) hours as needed for severe pain. 05/25/21   [name], MD  oxycodone (OXY-IR) 5 MG capsule Take 1 capsule (5 mg total) by mouth every 4 (four) hours as needed (severe pain). 05/23/21   [name], MD  vitamin B-12 (CYANOCOBALAMIN) 1000 MCG tablet Take 2,000 mcg by mouth daily.    [provider]  vitamin E 180 MG (400 UNITS) capsule Take  400 Units by mouth daily.    [provider]  zinc gluconate 50 MG tablet Take 50 mg by mouth daily.    [provider]    Allergies    Moxifloxacin, Penicillins, Shellfish allergy, and Sulfa antibiotics  Review of Systems   Review of Systems  Constitutional:  Negative for chills and fever.  Respiratory:  Positive for shortness of breath. Negative for cough.   Cardiovascular:  Positive for chest pain. Negative for leg swelling.  Gastrointestinal:  Negative for abdominal pain, diarrhea, nausea and vomiting.  All other systems reviewed and are negative.  Physical Exam Updated Vital Signs BP 110/69   Pulse 73   Temp 98  F (36.7 C)   Resp 17   Ht 6\' 2"  (1.88 m)   Wt 114.8 kg   SpO2 95%   BMI 32.48 kg/m   Physical Exam Vitals and nursing note reviewed.  Constitutional:      General: She is not in acute distress.    Appearance: She is not ill-appearing.  HENT:     Head: Normocephalic.  Eyes:     Pupils: Pupils are equal, round, and reactive to light.  Cardiovascular:     Rate and Rhythm: Normal rate and regular rhythm.     Pulses: Normal pulses.     Heart sounds: Normal heart sounds. No murmur heard.   No friction rub. No gallop.  Pulmonary:     Effort: Pulmonary effort is normal.     Breath sounds: Normal breath sounds.  Abdominal:     General: Abdomen is flat. There is no distension.     Palpations: Abdomen is soft.     Tenderness: There is no abdominal tenderness. There is no guarding or rebound.  Musculoskeletal:        General: Normal range of motion.     Cervical back: Neck supple.     Comments: No lower extremity edema. Mild tenderness throughout left calf  Skin:    General: Skin is warm and dry.  Neurological:     General: No focal deficit present.     Mental Status: She is alert.  Psychiatric:        Mood and Affect: Mood normal.        Behavior: Behavior normal.    ED Results / Procedures / Treatments   Labs (all labs ordered are listed, but only abnormal results are displayed) Labs Reviewed  CBC WITH DIFFERENTIAL/PLATELET - Abnormal; Notable for the following components:      Result Value   WBC 12.7 (*)    Lymphs Abs 5.4 (*)    All other components within normal limits  BASIC METABOLIC PANEL - Abnormal; Notable for the following components:   Calcium 8.6 (*)    All other components within normal limits  RESP PANEL BY RT-PCR (FLU A&B, COVID) ARPGX2  PROTIME-INR  HEPARIN LEVEL (UNFRACTIONATED)  HEPARIN LEVEL (UNFRACTIONATED)  TROPONIN I (HIGH SENSITIVITY)  TROPONIN I (HIGH SENSITIVITY)    EKG None  Radiology VAS LOWER EXTREMITY VENOUS (DVT) (ONLY [facility] &  [facility])  Result Date: 06/20/2021  Lower Venous DVT Study Patient Name:  Linda Gill  Date of Exam:   06/20/2021 Medical Rec #: [id number]         Accession #:    [id number] Date of Birth: 1976-02-19         Patient Gender: F Patient Age:   12 years Exam Location:  [facility] Procedure:      VAS  [place] LOWER EXTREMITY VENOUS (DVT) Referring Phys: Claudette Stapler --------------------------------------------------------------------------------  Indications: Pulmonary embolism, SOB, and bilateral calf pain s/p hip surgery.  Risk Factors: Surgery 06-05-2021 Left hip arthrosopy. Anticoagulation: Started on heparin 06-20-2021. Comparison Study: 06-20-2021 CTA of the chest performed at [facility]:                   Bilateral pulmonary embolism is present. The most proximal                   clot is near the origin of the lower arteries to the right                   upper lobe and right lower lobe. Clot extends into some of the                   segmental or subsegmental branch vessels, overall clot burden                   moderate. The left upper lobe and the right middle lobe appear                   relatively spared. Main pulmonary artery is normal in                   diameter. Performing Technologist: Jean Rosenthal RDMS, RVT  Examination Guidelines: A complete evaluation includes B-mode imaging, spectral Doppler, color Doppler, and power Doppler as needed of all accessible portions of each vessel. Bilateral testing is considered an integral part of a complete examination. Limited examinations for reoccurring indications may be performed as noted. The reflux portion of the exam is performed with the patient in reverse Trendelenburg.  +---------+---------------+---------+-----------+----------+--------------+ RIGHT    CompressibilityPhasicitySpontaneityPropertiesThrombus Aging +---------+---------------+---------+-----------+----------+--------------+ CFV      Full           Yes      Yes                                  +---------+---------------+---------+-----------+----------+--------------+ SFJ      Full                                                        +---------+---------------+---------+-----------+----------+--------------+ FV Prox  Full                                                        +---------+---------------+---------+-----------+----------+--------------+ FV Mid   Full                                                        +---------+---------------+---------+-----------+----------+--------------+ FV DistalFull                                                        +---------+---------------+---------+-----------+----------+--------------+  PFV      Full                                                        +---------+---------------+---------+-----------+----------+--------------+ POP      Full           Yes      Yes                                 +---------+---------------+---------+-----------+----------+--------------+ PTV      None           No       No                   Acute          +---------+---------------+---------+-----------+----------+--------------+ PERO     Full                                                        +---------+---------------+---------+-----------+----------+--------------+ Gastroc  Full                                                        +---------+---------------+---------+-----------+----------+--------------+   +---------+---------------+---------+-----------+----------+--------------+ LEFT     CompressibilityPhasicitySpontaneityPropertiesThrombus Aging +---------+---------------+---------+-----------+----------+--------------+ CFV      Full           Yes      Yes                                 +---------+---------------+---------+-----------+----------+--------------+ SFJ      Full                                                         +---------+---------------+---------+-----------+----------+--------------+ FV Prox  Full                                                        +---------+---------------+---------+-----------+----------+--------------+ FV Mid   Full                                                        +---------+---------------+---------+-----------+----------+--------------+ FV DistalFull                                                        +---------+---------------+---------+-----------+----------+--------------+  PFV      Full                                                        +---------+---------------+---------+-----------+----------+--------------+ POP      Full           Yes      Yes                                 +---------+---------------+---------+-----------+----------+--------------+ PTV      None           No       No                   Acute          +---------+---------------+---------+-----------+----------+--------------+ PERO     Full                                                        +---------+---------------+---------+-----------+----------+--------------+ Gastroc  Full                                                        +---------+---------------+---------+-----------+----------+--------------+     Summary: RIGHT: - Findings consistent with acute deep vein thrombosis involving the right posterior tibial veins.  LEFT: - Findings consistent with acute deep vein thrombosis involving the left posterior tibial veins.  *See table(s) above for measurements and observations.    Preliminary     Procedures .Critical Care Performed by: Mannie Stabile, PA-C Authorized by: Mannie Stabile, PA-C   Critical care provider statement:    Critical care time (minutes):  35   Critical care was necessary to treat or prevent imminent or life-threatening deterioration of the following conditions:  Respiratory failure   Critical care was time  spent personally by me on the following activities:  Pulse oximetry, development of treatment plan with patient or surrogate, discussions with consultants, evaluation of patient's response to treatment, examination of patient, obtaining history from patient or surrogate, ordering and review of laboratory studies and ordering and review of radiographic studies   I assumed direction of critical care for this patient from another provider in my specialty: no     Care discussed with: admitting provider     Medications Ordered in ED Medications  heparin ADULT infusion 100 units/mL (25000 units/253mL) (1,700 Units/hr Intravenous New Bag/Given 06/20/21 1730)  heparin bolus via infusion 7,500 Units (7,500 Units Intravenous Bolus from Bag 06/20/21 1731)    ED Course  I have reviewed the triage vital signs and the nursing notes.  Pertinent labs & imaging results that were available during my care of the patient were reviewed by me and considered in my medical decision making (see chart for details).  Clinical Course as of 06/20/21 1842  Tue Jun 20, 2021  1659 WBC(!): 12.7 [CA]    Clinical Course User Index [CA] Mannie Stabile, PA-C   MDM  Rules/Calculators/A&P                           45 year old female presents to the ED due to bilateral pulmonary embolism found on CTA earlier today.  Patient had a left hip arthroscopy on 10/17 and developed sudden onset of shortness of breath 4 days ago.  No previous history of DVT/PE.  Upon arrival, stable vitals.  Patient is afebrile, not tachycardic or hypoxic.  Patient in no acute distress.  Physical exam significant for mild tenderness to left calf.  No lower extremity edema.  Routine labs ordered.  DVT study added.  COVID test for admission.  Patient started on IV heparin.   CBC significant for mild leukocytosis 12.7.  Troponin normal.  BNP reassuring.  Normal renal function.  No major electrolyte derangements.  Ultrasound demonstrates bilateral  DVTs.  6:30 PM Discussed with Dr. Adela Glimpse with [facility] who agrees to admit patient for further treatment.   Final Clinical Impression(s) / ED Diagnoses Final diagnoses:  Other acute pulmonary embolism, unspecified whether acute cor pulmonale present [facility])    Rx / DC Orders ED Discharge Orders     None        Jesusita Oka 06/20/21 1843    Wynetta Fines, MD 06/20/21 2258

## 2021-06-20 NOTE — Progress Notes (Signed)
ANTICOAGULATION CONSULT NOTE - Initial Consult  Pharmacy Consult for Heparin Indication: pulmonary embolus  Allergies  Allergen Reactions   Moxifloxacin Rash   Penicillins Rash    Tolerated Ancef 2g IV on 06/05/2021   Shellfish Allergy Rash    Not allergic to iodine   Sulfa Antibiotics Rash    Patient Measurements: Height: 6\' 2"  (188 cm) Weight: 114.8 kg (253 lb) IBW/kg (Calculated) : 77.7 Heparin Dosing Weight: 102.4 kg   Vital Signs: Temp: 98 F (36.7 C) (11/01 1433) BP: 146/98 (11/01 1433) Pulse Rate: 82 (11/01 1433)  Labs: No results for input(s): HGB, HCT, PLT, APTT, LABPROT, INR, HEPARINUNFRC, HEPRLOWMOCWT, CREATININE, CKTOTAL, CKMB, TROPONINIHS in the last 72 hours.  CrCl cannot be calculated (Patient's most recent lab result is older than the maximum 21 days allowed.).   Medical History: Past Medical History:  Diagnosis Date   Arthritis    RA   Depression     Medications:  (Not in a hospital admission)  Scheduled:  Infusions:  PRN:   Assessment: 68 yof with a history of RA and depression. Patient is presenting with bilateral PE. Heparin per pharmacy consult placed for pulmonary embolus.  Patient is on not on anticoagulation prior to arrival.  Hgb ordered; plt ordered  Goal of Therapy:  Heparin level 0.3-0.7 units/ml Monitor platelets by anticoagulation protocol: Yes   Plan:  Give 7500 units bolus x 1 Start heparin infusion at 1700 units/hr Check anti-Xa level in 6-8 hours and daily while on heparin Continue to monitor H&H and platelets  55, PharmD, BCPS 06/20/2021 3:53 PM ED Clinical Pharmacist -  [phone]

## 2021-06-21 ENCOUNTER — Observation Stay (HOSPITAL_BASED_OUTPATIENT_CLINIC_OR_DEPARTMENT_OTHER): Payer: BC Managed Care – PPO

## 2021-06-21 DIAGNOSIS — I2694 Multiple subsegmental pulmonary emboli without acute cor pulmonale: Secondary | ICD-10-CM | POA: Diagnosis not present

## 2021-06-21 DIAGNOSIS — R0602 Shortness of breath: Secondary | ICD-10-CM

## 2021-06-21 DIAGNOSIS — I2699 Other pulmonary embolism without acute cor pulmonale: Secondary | ICD-10-CM

## 2021-06-21 LAB — URINALYSIS, ROUTINE W REFLEX MICROSCOPIC
Bilirubin Urine: NEGATIVE
Glucose, UA: NEGATIVE mg/dL
Ketones, ur: NEGATIVE mg/dL
Leukocytes,Ua: NEGATIVE
Nitrite: NEGATIVE
Protein, ur: NEGATIVE mg/dL
Specific Gravity, Urine: 1.008 (ref 1.005–1.030)
pH: 6 (ref 5.0–8.0)

## 2021-06-21 LAB — CBC WITH DIFFERENTIAL/PLATELET
Abs Immature Granulocytes: 0.05 10*3/uL (ref 0.00–0.07)
Basophils Absolute: 0.1 10*3/uL (ref 0.0–0.1)
Basophils Relative: 1 %
Eosinophils Absolute: 0.3 10*3/uL (ref 0.0–0.5)
Eosinophils Relative: 3 %
HCT: 38.1 % (ref 36.0–46.0)
Hemoglobin: 12.8 g/dL (ref 12.0–15.0)
Immature Granulocytes: 1 %
Lymphocytes Relative: 46 %
Lymphs Abs: 4.5 10*3/uL — ABNORMAL HIGH (ref 0.7–4.0)
MCH: 29.8 pg (ref 26.0–34.0)
MCHC: 33.6 g/dL (ref 30.0–36.0)
MCV: 88.6 fL (ref 80.0–100.0)
Monocytes Absolute: 0.4 10*3/uL (ref 0.1–1.0)
Monocytes Relative: 4 %
Neutro Abs: 4.3 10*3/uL (ref 1.7–7.7)
Neutrophils Relative %: 45 %
Platelets: 294 10*3/uL (ref 150–400)
RBC: 4.3 MIL/uL (ref 3.87–5.11)
RDW: 13.2 % (ref 11.5–15.5)
WBC: 9.5 10*3/uL (ref 4.0–10.5)
nRBC: 0 % (ref 0.0–0.2)

## 2021-06-21 LAB — CBC
HCT: 37.9 % (ref 36.0–46.0)
Hemoglobin: 12.4 g/dL (ref 12.0–15.0)
MCH: 29.5 pg (ref 26.0–34.0)
MCHC: 32.7 g/dL (ref 30.0–36.0)
MCV: 90.2 fL (ref 80.0–100.0)
Platelets: 268 10*3/uL (ref 150–400)
RBC: 4.2 MIL/uL (ref 3.87–5.11)
RDW: 13.1 % (ref 11.5–15.5)
WBC: 11.9 10*3/uL — ABNORMAL HIGH (ref 4.0–10.5)
nRBC: 0 % (ref 0.0–0.2)

## 2021-06-21 LAB — ECHOCARDIOGRAM COMPLETE
AV Mean grad: 3 mmHg
AV Peak grad: 5 mmHg
Ao pk vel: 1.12 m/s
Area-P 1/2: 3.66 cm2
Height: 74 in
S' Lateral: 2.8 cm
Weight: 4048 oz

## 2021-06-21 LAB — COMPREHENSIVE METABOLIC PANEL
ALT: 23 U/L (ref 0–44)
AST: 24 U/L (ref 15–41)
Albumin: 3.3 g/dL — ABNORMAL LOW (ref 3.5–5.0)
Alkaline Phosphatase: 52 U/L (ref 38–126)
Anion gap: 9 (ref 5–15)
BUN: 7 mg/dL (ref 6–20)
CO2: 21 mmol/L — ABNORMAL LOW (ref 22–32)
Calcium: 8.4 mg/dL — ABNORMAL LOW (ref 8.9–10.3)
Chloride: 106 mmol/L (ref 98–111)
Creatinine, Ser: 0.57 mg/dL (ref 0.44–1.00)
GFR, Estimated: >60 mL/min (ref >60)
Glucose, Bld: 92 mg/dL (ref 70–99)
Potassium: 3.8 mmol/L (ref 3.5–5.1)
Sodium: 136 mmol/L (ref 135–145)
Total Bilirubin: 0.6 mg/dL (ref 0.3–1.2)
Total Protein: 6.8 g/dL (ref 6.5–8.1)

## 2021-06-21 LAB — PHOSPHORUS: Phosphorus: 2.4 mg/dL — ABNORMAL LOW (ref 2.5–4.6)

## 2021-06-21 LAB — HEPARIN LEVEL (UNFRACTIONATED)
Heparin Unfractionated: 0.14 IU/mL — ABNORMAL LOW (ref 0.30–0.70)
Heparin Unfractionated: 0.32 IU/mL (ref 0.30–0.70)

## 2021-06-21 LAB — TSH: TSH: 2.305 u[IU]/mL (ref 0.350–4.500)

## 2021-06-21 LAB — MAGNESIUM: Magnesium: 2.1 mg/dL (ref 1.7–2.4)

## 2021-06-21 LAB — HIV ANTIBODY (ROUTINE TESTING W REFLEX): HIV Screen 4th Generation wRfx: NONREACTIVE

## 2021-06-21 MED ORDER — HEPARIN BOLUS VIA INFUSION
3000.0000 [IU] | Freq: Once | INTRAVENOUS | Status: AC
  Administered 2021-06-21: 3000 [IU] via INTRAVENOUS

## 2021-06-21 MED ORDER — METHOCARBAMOL 1000 MG/10ML IJ SOLN: 500.0000 mg | Freq: Four times a day (QID) | INTRAVENOUS | Status: DC | PRN

## 2021-06-21 MED ORDER — ONDANSETRON HCL 4 MG PO TABS: 4.0000 mg | ORAL_TABLET | Freq: Four times a day (QID) | ORAL | Status: DC | PRN

## 2021-06-21 MED ORDER — DOCUSATE SODIUM 100 MG PO CAPS
100.0000 mg | ORAL_CAPSULE | Freq: Two times a day (BID) | ORAL | Status: DC
  Administered 2021-06-21 – 2021-06-22 (×4): 100 mg via ORAL
  Filled 2021-06-21 (×5): qty 1

## 2021-06-21 MED ORDER — SODIUM CHLORIDE 0.9 % IV SOLN
75.0000 mL/h | INTRAVENOUS | Status: AC
  Administered 2021-06-21: 75 mL/h via INTRAVENOUS

## 2021-06-21 MED ORDER — ACETAMINOPHEN 650 MG RE SUPP: 650.0000 mg | Freq: Four times a day (QID) | RECTAL | Status: DC | PRN

## 2021-06-21 MED ORDER — ONDANSETRON HCL 4 MG/2ML IJ SOLN: 4.0000 mg | Freq: Four times a day (QID) | INTRAMUSCULAR | Status: DC | PRN

## 2021-06-21 MED ORDER — HYDROCODONE-ACETAMINOPHEN 5-325 MG PO TABS
1.0000 | ORAL_TABLET | ORAL | Status: DC | PRN
  Administered 2021-06-21 – 2021-06-22 (×6): 2 via ORAL
  Administered 2021-06-22: 1 via ORAL
  Filled 2021-06-21 (×5): qty 2
  Filled 2021-06-21: qty 1
  Filled 2021-06-21: qty 2

## 2021-06-21 MED ORDER — ACETAMINOPHEN 325 MG PO TABS: 650.0000 mg | ORAL_TABLET | Freq: Four times a day (QID) | ORAL | Status: DC | PRN

## 2021-06-21 MED ORDER — OXYCODONE HCL 5 MG PO TABS: 5.0000 mg | ORAL_TABLET | ORAL | Status: DC | PRN

## 2021-06-21 MED ORDER — FLUOXETINE HCL 20 MG PO CAPS
20.0000 mg | ORAL_CAPSULE | Freq: Every day | ORAL | Status: DC
  Administered 2021-06-21 – 2021-06-23 (×3): 20 mg via ORAL
  Filled 2021-06-21 (×3): qty 1

## 2021-06-21 MED ORDER — SENNA 8.6 MG PO TABS
1.0000 | ORAL_TABLET | Freq: Two times a day (BID) | ORAL | Status: DC
  Administered 2021-06-21 – 2021-06-22 (×3): 8.6 mg via ORAL
  Filled 2021-06-21 (×5): qty 1

## 2021-06-21 MED ORDER — MONTELUKAST SODIUM 10 MG PO TABS
10.0000 mg | ORAL_TABLET | Freq: Every day | ORAL | Status: DC
  Administered 2021-06-21 – 2021-06-23 (×3): 10 mg via ORAL
  Filled 2021-06-21 (×3): qty 1

## 2021-06-21 NOTE — Progress Notes (Signed)
PROGRESS NOTE    Linda Gill  OEU:[id number] DOB: October 15, 1975 DOA: 06/20/2021 PCP: Tracey Harries, MD   Chief Complaint  Patient presents with   Shortness of Breath  Brief Narrative/Hospital Course:  Linda Gill, 45 y.o. female with PMH of  hip surgery for labral tear on June 05, 2021 discharged on aspirin 325 for DVT prophylaxis presents to the ED with shortness of breath x6 days with decreased mobility.  Seen by PCP and had CT angio and was told that she has bilateral PE no heart  STRAIN and sent to the ED, imaging/report not available. In the ED found to have bilateral DVTs, patient was placed on anticoagulation and admitted for further management She has a printed report at bedside form [facility]: clots present Near origin of lower arteries to rt upper lobe and rt lower lobe extends into some segmental and or subsegmental branch, moderate clot burden. Subjective:  Some dyspnea with activity C/o calf pain Some cough No chest pain  Assessment & Plan:  Pulmonary embolism Bilateral DVT of the posterior tibial veins:  On heparin drip, suspect provoked VTE events due to recent hip surgery decreased mobility.  High sensitive troponin negative x2 follow-up echocardiogram.  Continue on heparin drip x48 hours then transition to Eliquis tomorrow.  Currently not hypoxic but dyspneic on activity has leg pain and having some coughs.  Mild leukocytosis: Likely reactive.Monitor.Afebrile.  RA:On infusion q 8 wk, had on Monday.  Class I Obesity:Patient's Body mass index is 32.48 kg/m. Will benefit with PCP follow-up, weight loss  healthy lifestyle and outpatient sleep evaluation.  DVT prophylaxis: heparin bolus via infusion 3,000 Units Start: 06/21/21 0200 Code Status:   Code Status: Not on file Family Communication: plan of care discussed with patient at bedside. Status is: Admitted as observation Remains hospitalized and will need at least 2 midnights for ongoing IV  anticoagulation for bilateral DVT and PE, as she is symptomatic with dyspnea on exertion.  Objective: Vitals last 24 hrs: Vitals:   06/21/21 0530 06/21/21 0600 06/21/21 0630 06/21/21 0700  BP: 114/69 110/71 112/68 110/71  Pulse: 66 67 70 64  Resp:  18    Temp:      TempSrc:      SpO2: 94% 96% 95% 95%  Weight:      Height:       Weight change:  No intake or output data in the 24 hours ending 06/21/21 0721 Net IO Since Admission: No IO data has been entered for this period [06/21/21 0721]   Physical Examination: General exam: AA0x3, weak,older than stated age. HEENT:Oral mucosa moist, Ear/Nose WNL grossly,dentition normal. Respiratory system: B/l diminished BS, no use of accessory muscle, non tender. Cardiovascular system: S1 & S2 +,No JVD. Gastrointestinal system: Abdomen soft, NT,ND, BS+. Nervous System:Alert, awake, moving extremities. Extremities: edema b/l LE, distal peripheral pulses palpable.  Skin: No rashes, no icterus. MSK: Normal muscle bulk, tone, power.  Medications reviewed:  Scheduled Meds: Continuous Infusions:  heparin 2,050 Units/hr (06/21/21 0209)    Diet Order     None     Weight change:   Wt Readings from Last 3 Encounters:  06/20/21 114.8 kg  06/05/21 113 kg  06/02/21 113.4 kg     Consultants:see note  Procedures:see note Antimicrobials: Anti-infectives (From admission, onward)    None      Culture/Microbiology No results found for: SDES, SPECREQUEST, CULT, REPTSTATUS  Other culture-see note  Unresulted Labs (From admission, onward)     Start  Ordered   06/22/21 0500  Heparin level (unfractionated)  Daily,   R     See Hyperspace for full Linked Orders Report.   06/21/21 0044   06/21/21 0500  CBC  Daily,   R     See Hyperspace for full Linked Orders Report.   06/21/21 0044   Signed and Held  HIV Antibody (routine testing w rflx)  (HIV Antibody (Routine testing w reflex) panel)  Tomorrow morning,   R        Signed and Held    Signed and Held  Magnesium  Tomorrow morning,   R        Signed and Held   Signed and Held  Phosphorus  Tomorrow morning,   R        Signed and Held   Signed and Held  CBC WITH DIFFERENTIAL  Tomorrow morning,   R        Signed and Held   Signed and Held  TSH  Tomorrow morning,   R        Signed and Held   Signed and Held  Comprehensive metabolic panel  Tomorrow morning,   R        Signed and Held          Data Reviewed: I have personally reviewed following labs and imaging studies CBC: Recent Labs  Lab 06/20/21 1509 06/21/21 0113  WBC 12.7* 11.9*  NEUTROABS 6.4  --   HGB 12.6 12.4  HCT 37.5 37.9  MCV 88.9 90.2  PLT 282 268   Basic Metabolic Panel: Recent Labs  Lab 06/20/21 1509  NA 137  K 4.1  CL 105  CO2 23  GLUCOSE 88  BUN 8  CREATININE 0.61  CALCIUM 8.6*  GFR: Estimated Creatinine Clearance: 126.9 mL/min (by C-G formula based on SCr of 0.61 mg/dL). Liver Function Tests: No results for input(s): AST, ALT, ALKPHOS, BILITOT, PROT, ALBUMIN in the last 168 hours. No results for input(s): LIPASE, AMYLASE in the last 168 hours. No results for input(s): AMMONIA in the last 168 hours. Coagulation Profile: Recent Labs  Lab 06/20/21 1607  INR 1.0   Cardiac Enzymes: No results for input(s): CKTOTAL, CKMB, CKMBINDEX, TROPONINI in the last 168 hours. BNP (last 3 results) No results for input(s): PROBNP in the last 8760 hours. HbA1C: No results for input(s): HGBA1C in the last 72 hours. CBG: No results for input(s): GLUCAP in the last 168 hours. Lipid Profile: No results for input(s): CHOL, HDL, LDLCALC, TRIG, CHOLHDL, LDLDIRECT in the last 72 hours. Thyroid Function Tests: No results for input(s): TSH, T4TOTAL, FREET4, T3FREE, THYROIDAB in the last 72 hours. Anemia Panel: No results for input(s): VITAMINB12, FOLATE, FERRITIN, TIBC, IRON, RETICCTPCT in the last 72 hours. Sepsis Labs: No results for input(s): PROCALCITON, LATICACIDVEN in the last 168  hours.  Recent Results (from the past 240 hour(s))  Resp Panel by RT-PCR (Flu A&B, Covid) Nasopharyngeal Swab     Status: None   Collection Time: 06/20/21  3:11 PM   Specimen: Nasopharyngeal Swab; Nasopharyngeal(NP) swabs in vial transport medium  Result Value Ref Range Status   SARS Coronavirus 2 by RT PCR NEGATIVE NEGATIVE Final    Comment: (NOTE) SARS-CoV-2 target nucleic acids are NOT DETECTED.  The SARS-CoV-2 RNA is generally detectable in upper respiratory specimens during the acute phase of infection. The lowest concentration of SARS-CoV-2 viral copies this assay can detect is 138 copies/mL. A negative result does not preclude SARS-Cov-2 infection and should not be  used as the sole basis for treatment or other patient management decisions. A negative result may occur with  improper specimen collection/handling, submission of specimen other than nasopharyngeal swab, presence of viral mutation(s) within the areas targeted by this assay, and inadequate number of viral copies(<138 copies/mL). A negative result must be combined with clinical observations, patient history, and epidemiological information. The expected result is Negative.  Fact Sheet for Patients:  BloggerCourse.com  Fact Sheet for Healthcare Providers:  SeriousBroker.it  This test is no t yet approved or cleared by the [place] FDA and  has been authorized for detection and/or diagnosis of SARS-CoV-2 by FDA under an Emergency Use Authorization (EUA). This EUA will remain  in effect (meaning this test can be used) for the duration of the COVID-19 declaration under Section 564(b)(1) of the Act, 21 U.S.C.section 360bbb-3(b)(1), unless the authorization is terminated  or revoked sooner.       Influenza A by PCR NEGATIVE NEGATIVE Final   Influenza B by PCR NEGATIVE NEGATIVE Final    Comment: (NOTE) The Xpert Xpress SARS-CoV-2/FLU/RSV plus assay is intended  as an aid in the diagnosis of influenza from Nasopharyngeal swab specimens and should not be used as a sole basis for treatment. Nasal washings and aspirates are unacceptable for Xpert Xpress SARS-CoV-2/FLU/RSV testing.  Fact Sheet for Patients: BloggerCourse.com  Fact Sheet for Healthcare Providers: SeriousBroker.it  This test is not yet approved or cleared by the [place] FDA and has been authorized for detection and/or diagnosis of SARS-CoV-2 by FDA under an Emergency Use Authorization (EUA). This EUA will remain in effect (meaning this test can be used) for the duration of the COVID-19 declaration under Section 564(b)(1) of the Act, 21 U.S.C. section 360bbb-3(b)(1), unless the authorization is terminated or revoked.  Performed at [facility], [address] [place], [place] [address]      Radiology Studies: VAS [place] LOWER EXTREMITY VENOUS (DVT) (ONLY [facility] & [facility])  Result Date: 06/21/2021  Lower Venous DVT Study Patient Name:  ZAHIRAH CHESLOCK  Date of Exam:   06/20/2021 Medical Rec #: [id number]         Accession #:    [id number] Date of Birth: [birth year]         Patient Gender: F Patient Age:   19 years Exam Location:  [facility] Procedure:      VAS [place] LOWER EXTREMITY VENOUS (DVT) Referring Phys: Claudette Stapler --------------------------------------------------------------------------------  Indications: Pulmonary embolism, SOB, and bilateral calf pain s/p hip surgery.  Risk Factors: Surgery 06-05-2021 Left hip arthrosopy. Anticoagulation: Started on heparin 06-20-2021. Comparison Study: 06-20-2021 CTA of the chest performed at [facility]:                   Bilateral pulmonary embolism is present. The most proximal                   clot is near the origin of the lower arteries to the right                   upper lobe and right lower lobe. Clot extends into some of the                   segmental or subsegmental  branch vessels, overall clot burden                   moderate. The left upper lobe and the right middle lobe appear  relatively spared. Main pulmonary artery is normal in                   diameter. Performing Technologist: Jean Rosenthal RDMS, RVT  Examination Guidelines: A complete evaluation includes B-mode imaging, spectral Doppler, color Doppler, and power Doppler as needed of all accessible portions of each vessel. Bilateral testing is considered an integral part of a complete examination. Limited examinations for reoccurring indications may be performed as noted. The reflux portion of the exam is performed with the patient in reverse Trendelenburg.  +---------+---------------+---------+-----------+----------+--------------+ RIGHT    CompressibilityPhasicitySpontaneityPropertiesThrombus Aging +---------+---------------+---------+-----------+----------+--------------+ CFV      Full           Yes      Yes                                 +---------+---------------+---------+-----------+----------+--------------+ SFJ      Full                                                        +---------+---------------+---------+-----------+----------+--------------+ FV Prox  Full                                                        +---------+---------------+---------+-----------+----------+--------------+ FV Mid   Full                                                        +---------+---------------+---------+-----------+----------+--------------+ FV DistalFull                                                        +---------+---------------+---------+-----------+----------+--------------+ PFV      Full                                                        +---------+---------------+---------+-----------+----------+--------------+ POP      Full           Yes      Yes                                  +---------+---------------+---------+-----------+----------+--------------+ PTV      None           No       No                   Acute          +---------+---------------+---------+-----------+----------+--------------+ PERO     Full                                                        +---------+---------------+---------+-----------+----------+--------------+  Gastroc  Full                                                        +---------+---------------+---------+-----------+----------+--------------+   +---------+---------------+---------+-----------+----------+--------------+ LEFT     CompressibilityPhasicitySpontaneityPropertiesThrombus Aging +---------+---------------+---------+-----------+----------+--------------+ CFV      Full           Yes      Yes                                 +---------+---------------+---------+-----------+----------+--------------+ SFJ      Full                                                        +---------+---------------+---------+-----------+----------+--------------+ FV Prox  Full                                                        +---------+---------------+---------+-----------+----------+--------------+ FV Mid   Full                                                        +---------+---------------+---------+-----------+----------+--------------+ FV DistalFull                                                        +---------+---------------+---------+-----------+----------+--------------+ PFV      Full                                                        +---------+---------------+---------+-----------+----------+--------------+ POP      Full           Yes      Yes                                 +---------+---------------+---------+-----------+----------+--------------+ PTV      None           No       No                   Acute           +---------+---------------+---------+-----------+----------+--------------+ PERO     Full                                                        +---------+---------------+---------+-----------+----------+--------------+  Gastroc  Full                                                        +---------+---------------+---------+-----------+----------+--------------+     Summary: RIGHT: - Findings consistent with acute deep vein thrombosis involving the right posterior tibial veins.  LEFT: - Findings consistent with acute deep vein thrombosis involving the left posterior tibial veins.  *See table(s) above for measurements and observations. Electronically signed by Waverly Ferrari MD on 06/21/2021 at 6:48:43 AM.    Final      LOS: 0 days   Lanae Boast, MD [facility]  06/21/2021, 7:21 AM

## 2021-06-21 NOTE — Progress Notes (Addendum)
ANTICOAGULATION CONSULT NOTE   Pharmacy Consult for Heparin Indication: pulmonary embolus and bilateral DVTs  Allergies  Allergen Reactions   Moxifloxacin Rash   Penicillins Rash    Tolerated Ancef 2g IV on 06/05/2021   Shellfish Allergy Rash    Not allergic to iodine   Sulfa Antibiotics Rash    Patient Measurements: Height: 6\' 2"  (188 cm) Weight: 114.8 kg (253 lb) IBW/kg (Calculated) : 77.7 Heparin Dosing Weight: 100 kg   Vital Signs: Temp: 97.8 F (36.6 C) (11/01 1930) Temp Source: Oral (11/01 1930) BP: 113/71 (11/02 0100) Pulse Rate: 71 (11/02 0100)  Labs: Recent Labs    06/20/21 1509 06/20/21 1607 06/20/21 1709 06/21/21 0113  HGB 12.6  --   --  12.4  HCT 37.5  --   --  37.9  PLT 282  --   --  268  LABPROT  --  13.3  --   --   INR  --  1.0  --   --   HEPARINUNFRC  --   --   --  0.14*  CREATININE 0.61  --   --   --   TROPONINIHS <2  --  <2  --     Estimated Creatinine Clearance: 126.9 mL/min (by C-G formula based on SCr of 0.61 mg/dL).  Assessment: 62 yof with a history of RA and depression. Patient is presenting with bilateral PE and bilateral DVTs. Heparin per pharmacy consult placed for pulmonary embolus.  Heparin level subtherapeutic (0.14) on gtt at 1700 units/hr. No issues with line or bleeding reported per RN.  Goal of Therapy:  Heparin level 0.3-0.7 units/ml Monitor platelets by anticoagulation protocol: Yes   Plan:  Rebolus heparin 3000 units Increase heparin infusion to 2050 units/hr F/u 6 hr heparin level  57, PharmD, BCPS Please see amion for complete clinical pharmacist phone list 06/21/2021 1:50 AM

## 2021-06-21 NOTE — Progress Notes (Signed)
ANTICOAGULATION CONSULT NOTE   Pharmacy Consult for Heparin Indication: pulmonary embolus and bilateral DVTs  Allergies  Allergen Reactions   Moxifloxacin Rash   Penicillins Rash    Tolerated Ancef 2g IV on 06/05/2021   Shellfish Allergy Rash    Not allergic to iodine   Sulfa Antibiotics Rash    Patient Measurements: Height: 6\' 2"  (188 cm) Weight: 114.8 kg (253 lb) IBW/kg (Calculated) : 77.7 Heparin Dosing Weight: 100 kg   Vital Signs: Temp: 97.9 F (36.6 C) (11/02 0757) Temp Source: Oral (11/02 0757) BP: 121/67 (11/02 0757) Pulse Rate: 71 (11/02 0757)  Labs: Recent Labs    06/20/21 1509 06/20/21 1607 06/20/21 1709 06/21/21 0113 06/21/21 0759 06/21/21 0902  HGB 12.6  --   --  12.4 12.8  --   HCT 37.5  --   --  37.9 38.1  --   PLT 282  --   --  268 294  --   LABPROT  --  13.3  --   --   --   --   INR  --  1.0  --   --   --   --   HEPARINUNFRC  --   --   --  0.14*  --  0.32  CREATININE 0.61  --   --   --   --   --   TROPONINIHS <2  --  <2  --   --   --      Estimated Creatinine Clearance: 126.9 mL/min (by C-G formula based on SCr of 0.61 mg/dL).  Assessment: 41 yof with a history of RA and depression. Patient is presenting with bilateral PE and bilateral DVTs. Heparin per pharmacy consult placed for pulmonary embolus.  Heparin level low end therapeutic s/p rate increase to 2050 units/hr  Goal of Therapy:  Heparin level 0.3-0.7 units/ml Monitor platelets by anticoagulation protocol: Yes   Plan:  Increase heparin gtt slightly to 2100 units/hr Daily heparin level, CBC, s/s bleeding F/u long term AC plan and ability to transition to PO  57, PharmD Clinical Pharmacist ED Pharmacist Phone # [phone] 06/21/2021 9:51 AM

## 2021-06-21 NOTE — Plan of Care (Signed)

## 2021-06-21 NOTE — ED Notes (Signed)
ED TO INPATIENT HANDOFF REPORT  ED Nurse Name and Phone #: Feliz Beam [phone]  S Name/Age/Gender Linda Gill 45 y.o. female Room/Bed: 021C/021C  Code Status   Code Status: Full Code  Home/SNF/Other Home Patient oriented to: self, place, time  Is this baseline? Yes   Triage Complete: Triage complete  Chief Complaint Pulmonary embolism (HCC) [I26.99]  Triage Note Pt from PCP for tx of bilateral PE's on CTA found this morning. Had surgery on hip last week and has been shob since Thursday.    Allergies Allergies  Allergen Reactions   Moxifloxacin Rash   Penicillins Rash    Tolerated Ancef 2g IV on 06/05/2021   Shellfish Allergy Rash    Not allergic to iodine   Sulfa Antibiotics Rash    Level of Care/Admitting Diagnosis ED Disposition     ED Disposition  Admit   Condition  --   Comment  Hospital Area: [facility] [[id number]]  Level of Care: Telemetry Medical [[id number]]  May place patient in observation at [facility] or [facility] if equivalent level of care is available:: No  Covid Evaluation: Asymptomatic Screening Protocol (No Symptoms)  Diagnosis: Pulmonary embolism (HCC) [241700]  Admitting Physician: Therisa Doyne [[id number]]  Attending Physician: Therisa Doyne [[id number]]          B Medical/Surgery History Past Medical History:  Diagnosis Date   Arthritis    RA   Depression    Past Surgical History:  Procedure Laterality Date   BREAST MASS EXCISION     HIP ARTHROSCOPY Left 06/05/2021   Procedure: left hip arthroscopy with labral repair and cam debridement;  Surgeon: [name], MD;  Location: [facility] OR;  Service: Orthopedics;  Laterality: Left;     A IV Location/Drains/Wounds Patient Lines/Drains/Airways Status     Active Line/Drains/Airways     Name Placement date Placement time Site Days   Peripheral IV 06/21/21 22 G Anterior;Right Forearm 06/21/21  0850  Forearm  less than 1   Incision (Closed) 06/05/21 Hip Left 06/05/21   1422  -- 16            Intake/Output Last 24 hours No intake or output data in the 24 hours ending 06/21/21 1133  Labs/Imaging Results for orders placed or performed during the hospital encounter of 06/20/21 (from the past 48 hour(s))  CBC with Differential     Status: Abnormal   Collection Time: 06/20/21  3:09 PM  Result Value Ref Range   WBC 12.7 (H) 4.0 - 10.5 K/uL   RBC 4.22 3.87 - 5.11 MIL/uL   Hemoglobin 12.6 12.0 - 15.0 g/dL   HCT 62.1 30.8 - 65.7 %   MCV 88.9 80.0 - 100.0 fL   MCH 29.9 26.0 - 34.0 pg   MCHC 33.6 30.0 - 36.0 g/dL   RDW 84.6 96.2 - 95.2 %   Platelets 282 150 - 400 K/uL   nRBC 0.0 0.0 - 0.2 %   Neutrophils Relative % 51 %   Neutro Abs 6.4 1.7 - 7.7 K/uL   Lymphocytes Relative 43 %   Lymphs Abs 5.4 (H) 0.7 - 4.0 K/uL   Monocytes Relative 4 %   Monocytes Absolute 0.5 0.1 - 1.0 K/uL   Eosinophils Relative 2 %   Eosinophils Absolute 0.2 0.0 - 0.5 K/uL   Basophils Relative 0 %   Basophils Absolute 0.0 0.0 - 0.1 K/uL   Immature Granulocytes 0 %   Abs Immature Granulocytes 0.05 0.00 - 0.07 K/uL  Comment: Performed at [facility], [address], [place], [place] [address]  Basic metabolic panel     Status: Abnormal   Collection Time: 06/20/21  3:09 PM  Result Value Ref Range   Sodium 137 135 - 145 mmol/L   Potassium 4.1 3.5 - 5.1 mmol/L   Chloride 105 98 - 111 mmol/L   CO2 23 22 - 32 mmol/L   Glucose, Bld 88 70 - 99 mg/dL    Comment: Glucose reference range applies only to samples taken after fasting for at least 8 hours.   BUN 8 6 - 20 mg/dL   Creatinine, Ser 8.88 0.44 - 1.00 mg/dL   Calcium 8.6 (L) 8.9 - 10.3 mg/dL   GFR, Estimated >91 >69 mL/min    Comment: (NOTE) Calculated using the CKD-EPI Creatinine Equation (2021)    Anion gap 9 5 - 15    Comment: Performed at [facility], [address], [place], [place] [address]  Troponin I (High Sensitivity)     Status: None   Collection Time: 06/20/21  3:09 PM  Result Value Ref  Range   Troponin I (High Sensitivity) <2 <18 ng/L    Comment: (NOTE) Elevated high sensitivity troponin I (hsTnI) values and significant  changes across serial measurements may suggest ACS but many other  chronic and acute conditions are known to elevate hsTnI results.  Refer to the "Links" section for chest pain algorithms and additional  guidance. Performed at [facility], [address], [place], [place] [address]   Resp Panel by RT-PCR (Flu A&B, Covid) Nasopharyngeal Swab     Status: None   Collection Time: 06/20/21  3:11 PM   Specimen: Nasopharyngeal Swab; Nasopharyngeal(NP) swabs in vial transport medium  Result Value Ref Range   SARS Coronavirus 2 by RT PCR NEGATIVE NEGATIVE    Comment: (NOTE) SARS-CoV-2 target nucleic acids are NOT DETECTED.  The SARS-CoV-2 RNA is generally detectable in upper respiratory specimens during the acute phase of infection. The lowest concentration of SARS-CoV-2 viral copies this assay can detect is 138 copies/mL. A negative result does not preclude SARS-Cov-2 infection and should not be used as the sole basis for treatment or other patient management decisions. A negative result may occur with  improper specimen collection/handling, submission of specimen other than nasopharyngeal swab, presence of viral mutation(s) within the areas targeted by this assay, and inadequate number of viral copies(<138 copies/mL). A negative result must be combined with clinical observations, patient history, and epidemiological information. The expected result is Negative.  Fact Sheet for Patients:  BloggerCourse.com  Fact Sheet for Healthcare Providers:  SeriousBroker.it  This test is no t yet approved or cleared by the [place] FDA and  has been authorized for detection and/or diagnosis of SARS-CoV-2 by FDA under an Emergency Use Authorization (EUA). This EUA will remain  in effect (meaning this  test can be used) for the duration of the COVID-19 declaration under Section 564(b)(1) of the Act, 21 U.S.C.section 360bbb-3(b)(1), unless the authorization is terminated  or revoked sooner.       Influenza A by PCR NEGATIVE NEGATIVE   Influenza B by PCR NEGATIVE NEGATIVE    Comment: (NOTE) The Xpert Xpress SARS-CoV-2/FLU/RSV plus assay is intended as an aid in the diagnosis of influenza from Nasopharyngeal swab specimens and should not be used as a sole basis for treatment. Nasal washings and aspirates are unacceptable for Xpert Xpress SARS-CoV-2/FLU/RSV testing.  Fact Sheet for Patients: BloggerCourse.com  Fact  Sheet for Healthcare Providers: SeriousBroker.it  This test is not yet approved or cleared by the [place] and has been authorized for detection and/or diagnosis of SARS-CoV-2 by FDA under an Emergency Use Authorization (EUA). This EUA will remain in effect (meaning this test can be used) for the duration of the COVID-19 declaration under Section 564(b)(1) of the Act, 21 U.S.C. section 360bbb-3(b)(1), unless the authorization is terminated or revoked.  Performed at [facility], [address], [place], [place] [address]   Protime-INR     Status: None   Collection Time: 06/20/21  4:07 PM  Result Value Ref Range   Prothrombin Time 13.3 11.4 - 15.2 seconds   INR 1.0 0.8 - 1.2    Comment: (NOTE) INR goal varies based on device and disease states. Performed at [facility], [address], [place], [place] [address]   Troponin I (High Sensitivity)     Status: None   Collection Time: 06/20/21  5:09 PM  Result Value Ref Range   Troponin I (High Sensitivity) <2 <18 ng/L    Comment: (NOTE) Elevated high sensitivity troponin I (hsTnI) values and significant  changes across serial measurements may suggest ACS but many other  chronic and acute conditions are known to elevate hsTnI results.  Refer to  the "Links" section for chest pain algorithms and additional  guidance. Performed at [facility], [address], [place], [place] [address]   Heparin level (unfractionated)     Status: Abnormal   Collection Time: 06/21/21  1:13 AM  Result Value Ref Range   Heparin Unfractionated 0.14 (L) 0.30 - 0.70 IU/mL    Comment: (NOTE) The clinical reportable range upper limit is being lowered to >1.10 to align with the FDA approved guidance for the current laboratory assay.  If heparin results are below expected values, and patient dosage has  been confirmed, suggest follow up testing of antithrombin III levels. Performed at [facility], [address], [place], [place] [address]   CBC     Status: Abnormal   Collection Time: 06/21/21  1:13 AM  Result Value Ref Range   WBC 11.9 (H) 4.0 - 10.5 K/uL   RBC 4.20 3.87 - 5.11 MIL/uL   Hemoglobin 12.4 12.0 - 15.0 g/dL   HCT 70.4 88.8 - 91.6 %   MCV 90.2 80.0 - 100.0 fL   MCH 29.5 26.0 - 34.0 pg   MCHC 32.7 30.0 - 36.0 g/dL   RDW 94.5 03.8 - 88.2 %   Platelets 268 150 - 400 K/uL   nRBC 0.0 0.0 - 0.2 %    Comment: Performed at [facility], [address], [place], [place] [address]  Urinalysis, Routine w reflex microscopic Urine, Clean Catch     Status: Abnormal   Collection Time: 06/21/21  2:15 AM  Result Value Ref Range   Color, Urine YELLOW YELLOW   APPearance CLEAR CLEAR   Specific Gravity, Urine 1.008 1.005 - 1.030   pH 6.0 5.0 - 8.0   Glucose, UA NEGATIVE NEGATIVE mg/dL   Hgb urine dipstick MODERATE (A) NEGATIVE   Bilirubin Urine NEGATIVE NEGATIVE   Ketones, ur NEGATIVE NEGATIVE mg/dL   Protein, ur NEGATIVE NEGATIVE mg/dL   Nitrite NEGATIVE NEGATIVE   Leukocytes,Ua NEGATIVE NEGATIVE   RBC / HPF 0-5 0 - 5 RBC/hpf   WBC, UA 0-5 0 - 5 WBC/hpf   Bacteria, UA RARE (A) NONE SEEN   Squamous Epithelial /  LPF 0-5 0 - 5   Mucus PRESENT     Comment: Performed at [facility], [address] [place], [place]  [address]  Magnesium     Status: None   Collection Time: 06/21/21  7:59 AM  Result Value Ref Range   Magnesium 2.1 1.7 - 2.4 mg/dL    Comment: Performed at [facility], [address], [place], [place] [address]  Phosphorus     Status: Abnormal   Collection Time: 06/21/21  7:59 AM  Result Value Ref Range   Phosphorus 2.4 (L) 2.5 - 4.6 mg/dL    Comment: Performed at [facility], [address], [place], [place] [address]  Comprehensive metabolic panel     Status: Abnormal   Collection Time: 06/21/21  7:59 AM  Result Value Ref Range   Sodium 136 135 - 145 mmol/L   Potassium 3.8 3.5 - 5.1 mmol/L   Chloride 106 98 - 111 mmol/L   CO2 21 (L) 22 - 32 mmol/L   Glucose, Bld 92 70 - 99 mg/dL    Comment: Glucose reference range applies only to samples taken after fasting for at least 8 hours.   BUN 7 6 - 20 mg/dL   Creatinine, Ser 9.62 0.44 - 1.00 mg/dL   Calcium 8.4 (L) 8.9 - 10.3 mg/dL   Total Protein 6.8 6.5 - 8.1 g/dL   Albumin 3.3 (L) 3.5 - 5.0 g/dL   AST 24 15 - 41 U/L   ALT 23 0 - 44 U/L   Alkaline Phosphatase 52 38 - 126 U/L   Total Bilirubin 0.6 0.3 - 1.2 mg/dL   GFR, Estimated >95 >28 mL/min    Comment: (NOTE) Calculated using the CKD-EPI Creatinine Equation (2021)    Anion gap 9 5 - 15    Comment: Performed at [facility], [address], [place], [place] [address]  CBC WITH DIFFERENTIAL     Status: Abnormal   Collection Time: 06/21/21  7:59 AM  Result Value Ref Range   WBC 9.5 4.0 - 10.5 K/uL   RBC 4.30 3.87 - 5.11 MIL/uL   Hemoglobin 12.8 12.0 - 15.0 g/dL   HCT 40.1 02.7 - 25.3 %   MCV 88.6 80.0 - 100.0 fL   MCH 29.8 26.0 - 34.0 pg   MCHC 33.6 30.0 - 36.0 g/dL   RDW 66.4 40.3 - 47.4 %   Platelets 294 150 - 400 K/uL   nRBC 0.0 0.0 - 0.2 %   Neutrophils Relative % 45 %   Neutro Abs 4.3 1.7 - 7.7 K/uL   Lymphocytes Relative 46 %   Lymphs Abs 4.5 (H) 0.7 - 4.0 K/uL   Monocytes Relative 4 %   Monocytes Absolute 0.4 0.1 - 1.0 K/uL   Eosinophils  Relative 3 %   Eosinophils Absolute 0.3 0.0 - 0.5 K/uL   Basophils Relative 1 %   Basophils Absolute 0.1 0.0 - 0.1 K/uL   Immature Granulocytes 1 %   Abs Immature Granulocytes 0.05 0.00 - 0.07 K/uL    Comment: Performed at [facility], [address] [place], [place] [address]  Heparin level (unfractionated)     Status: None   Collection Time: 06/21/21  9:02 AM  Result Value Ref Range   Heparin Unfractionated 0.32 0.30 - 0.70 IU/mL    Comment: (NOTE) The clinical reportable range upper limit is being lowered to >1.10 to align with the FDA approved guidance for the current  laboratory assay.  If heparin results are below expected values, and patient dosage has  been confirmed, suggest follow up testing of antithrombin III levels. Performed at [facility], [address], [place], [place] [address]    VAS [place] LOWER EXTREMITY VENOUS (DVT) (ONLY [facility] & [facility])  Result Date: 06/21/2021  Lower Venous DVT Study Patient Name:  Linda Gill  Date of Exam:   06/20/2021 Medical Rec #: [id number]         Accession #:    [id number] Date of Birth: 01-03-1976         Patient Gender: F Patient Age:   88 years Exam Location:  [facility] Procedure:      VAS [place] LOWER EXTREMITY VENOUS (DVT) Referring Phys: Claudette Stapler --------------------------------------------------------------------------------  Indications: Pulmonary embolism, SOB, and bilateral calf pain s/p hip surgery.  Risk Factors: Surgery 06-05-2021 Left hip arthrosopy. Anticoagulation: Started on heparin 06-20-2021. Comparison Study: 06-20-2021 CTA of the chest performed at [facility]:                   Bilateral pulmonary embolism is present. The most proximal                   clot is near the origin of the lower arteries to the right                   upper lobe and right lower lobe. Clot extends into some of the                   segmental or subsegmental branch vessels, overall clot burden                   moderate. The  left upper lobe and the right middle lobe appear                   relatively spared. Main pulmonary artery is normal in                   diameter. Performing Technologist: Jean Rosenthal RDMS, RVT  Examination Guidelines: A complete evaluation includes B-mode imaging, spectral Doppler, color Doppler, and power Doppler as needed of all accessible portions of each vessel. Bilateral testing is considered an integral part of a complete examination. Limited examinations for reoccurring indications may be performed as noted. The reflux portion of the exam is performed with the patient in reverse Trendelenburg.  +---------+---------------+---------+-----------+----------+--------------+ RIGHT    CompressibilityPhasicitySpontaneityPropertiesThrombus Aging +---------+---------------+---------+-----------+----------+--------------+ CFV      Full           Yes      Yes                                 +---------+---------------+---------+-----------+----------+--------------+ SFJ      Full                                                        +---------+---------------+---------+-----------+----------+--------------+ FV Prox  Full                                                        +---------+---------------+---------+-----------+----------+--------------+  FV Mid   Full                                                        +---------+---------------+---------+-----------+----------+--------------+ FV DistalFull                                                        +---------+---------------+---------+-----------+----------+--------------+ PFV      Full                                                        +---------+---------------+---------+-----------+----------+--------------+ POP      Full           Yes      Yes                                 +---------+---------------+---------+-----------+----------+--------------+ PTV      None           No       No                    Acute          +---------+---------------+---------+-----------+----------+--------------+ PERO     Full                                                        +---------+---------------+---------+-----------+----------+--------------+ Gastroc  Full                                                        +---------+---------------+---------+-----------+----------+--------------+   +---------+---------------+---------+-----------+----------+--------------+ LEFT     CompressibilityPhasicitySpontaneityPropertiesThrombus Aging +---------+---------------+---------+-----------+----------+--------------+ CFV      Full           Yes      Yes                                 +---------+---------------+---------+-----------+----------+--------------+ SFJ      Full                                                        +---------+---------------+---------+-----------+----------+--------------+ FV Prox  Full                                                        +---------+---------------+---------+-----------+----------+--------------+  FV Mid   Full                                                        +---------+---------------+---------+-----------+----------+--------------+ FV DistalFull                                                        +---------+---------------+---------+-----------+----------+--------------+ PFV      Full                                                        +---------+---------------+---------+-----------+----------+--------------+ POP      Full           Yes      Yes                                 +---------+---------------+---------+-----------+----------+--------------+ PTV      None           No       No                   Acute          +---------+---------------+---------+-----------+----------+--------------+ PERO     Full                                                         +---------+---------------+---------+-----------+----------+--------------+ Gastroc  Full                                                        +---------+---------------+---------+-----------+----------+--------------+     Summary: RIGHT: - Findings consistent with acute deep vein thrombosis involving the right posterior tibial veins.  LEFT: - Findings consistent with acute deep vein thrombosis involving the left posterior tibial veins.  *See table(s) above for measurements and observations. Electronically signed by Waverly Ferrari MD on 06/21/2021 at 6:48:43 AM.    Final     Pending Labs Unresulted Labs (From admission, onward)     Start     Ordered   06/22/21 0500  Heparin level (unfractionated)  Daily,   R     See Hyperspace for full Linked Orders Report.   06/21/21 0044   06/21/21 0759  HIV Antibody (routine testing w rflx)  (HIV Antibody (Routine testing w reflex) panel)  Tomorrow morning,   R        06/21/21 0759   06/21/21 0759  TSH  Tomorrow morning,   R        06/21/21 0759   06/21/21 0500  CBC  Daily,   R     See Hyperspace for full Linked Orders Report.  06/21/21 0044            Vitals/Pain Today's Vitals   06/21/21 0757 06/21/21 1117 06/21/21 1118 06/21/21 1126  BP: 121/67 131/81    Pulse: 71 68    Resp: 18 18    Temp: 97.9 F (36.6 C) 98.1 F (36.7 C)    TempSrc: Oral Oral    SpO2: 98% 98%    Weight:      Height:      PainSc: Isolation Precautions No active isolations  Medications Medications  heparin ADULT infusion 100 units/mL (25000 units/221mL) (2,100 Units/hr Intravenous Rate/Dose Change 06/21/21 1119)  FLUoxetine (PROZAC) capsule 20 mg (20 mg Oral Given 06/21/21 1121)  montelukast (SINGULAIR) tablet 10 mg (10 mg Oral Given 06/21/21 1123)  oxyCODONE (Oxy IR/ROXICODONE) immediate release tablet 5 mg (has no administration in time range)  0.9 %  sodium chloride infusion (75 mL/hr Intravenous New Bag/Given 06/21/21 0900)   acetaminophen (TYLENOL) tablet 650 mg (has no administration in time range)    Or  acetaminophen (TYLENOL) suppository 650 mg (has no administration in time range)  HYDROcodone-acetaminophen (NORCO/VICODIN) 5-325 MG per tablet 1-2 tablet (2 tablets Oral Given 06/21/21 0832)  methocarbamol (ROBAXIN) 500 mg in dextrose 5 % 50 mL IVPB (has no administration in time range)  docusate sodium (COLACE) capsule 100 mg (100 mg Oral Given 06/21/21 1120)  senna (SENOKOT) tablet 8.6 mg (8.6 mg Oral Given 06/21/21 1124)  ondansetron (ZOFRAN) tablet 4 mg (has no administration in time range)    Or  ondansetron (ZOFRAN) injection 4 mg (has no administration in time range)  heparin bolus via infusion 7,500 Units (7,500 Units Intravenous Bolus from Bag 06/20/21 1731)  heparin bolus via infusion 3,000 Units (3,000 Units Intravenous Bolus from Bag 06/21/21 0201)    Mobility walks Low fall risk      R Recommendations: See Admitting Provider Note  Report given to:   Additional Notes:

## 2021-06-21 NOTE — ED Notes (Signed)
Pt in bed, pt c/o pain at iv site in R ac, no signs of infiltration.  Started iv in R forearm, scant blood return, blood drawn from L ac on second attempt, samples sent to lab, pt tolerated well.

## 2021-06-22 ENCOUNTER — Other Ambulatory Visit (HOSPITAL_COMMUNITY): Payer: Self-pay

## 2021-06-22 ENCOUNTER — Encounter (HOSPITAL_BASED_OUTPATIENT_CLINIC_OR_DEPARTMENT_OTHER): Payer: BC Managed Care – PPO | Admitting: Physical Therapy

## 2021-06-22 DIAGNOSIS — I2694 Multiple subsegmental pulmonary emboli without acute cor pulmonale: Secondary | ICD-10-CM | POA: Diagnosis not present

## 2021-06-22 LAB — BASIC METABOLIC PANEL
Anion gap: 6 (ref 5–15)
BUN: 7 mg/dL (ref 6–20)
CO2: 24 mmol/L (ref 22–32)
Calcium: 8.4 mg/dL — ABNORMAL LOW (ref 8.9–10.3)
Chloride: 109 mmol/L (ref 98–111)
Creatinine, Ser: 0.63 mg/dL (ref 0.44–1.00)
GFR, Estimated: >60 mL/min (ref >60)
Glucose, Bld: 101 mg/dL — ABNORMAL HIGH (ref 70–99)
Potassium: 4 mmol/L (ref 3.5–5.1)
Sodium: 139 mmol/L (ref 135–145)

## 2021-06-22 LAB — HEPARIN LEVEL (UNFRACTIONATED): Heparin Unfractionated: 0.27 IU/mL — ABNORMAL LOW (ref 0.30–0.70)

## 2021-06-22 LAB — CBC
HCT: 36.5 % (ref 36.0–46.0)
Hemoglobin: 12.1 g/dL (ref 12.0–15.0)
MCH: 29.5 pg (ref 26.0–34.0)
MCHC: 33.2 g/dL (ref 30.0–36.0)
MCV: 89 fL (ref 80.0–100.0)
Platelets: 273 10*3/uL (ref 150–400)
RBC: 4.1 MIL/uL (ref 3.87–5.11)
RDW: 13.2 % (ref 11.5–15.5)
WBC: 10.8 10*3/uL — ABNORMAL HIGH (ref 4.0–10.5)
nRBC: 0 % (ref 0.0–0.2)

## 2021-06-22 MED ORDER — HEPARIN BOLUS VIA INFUSION
1500.0000 [IU] | Freq: Once | INTRAVENOUS | Status: AC
  Administered 2021-06-22: 1500 [IU] via INTRAVENOUS
  Filled 2021-06-22: qty 1500

## 2021-06-22 MED ORDER — APIXABAN 5 MG PO TABS
10.0000 mg | ORAL_TABLET | Freq: Two times a day (BID) | ORAL | Status: DC
  Administered 2021-06-22 – 2021-06-23 (×2): 10 mg via ORAL
  Filled 2021-06-22 (×3): qty 2

## 2021-06-22 MED ORDER — APIXABAN 5 MG PO TABS: 5.0000 mg | ORAL_TABLET | Freq: Two times a day (BID) | ORAL | Status: DC

## 2021-06-22 MED ORDER — HEPARIN (PORCINE) 25000 UT/250ML-% IV SOLN
2300.0000 [IU]/h | INTRAVENOUS | Status: AC
  Administered 2021-06-22: 2300 [IU]/h via INTRAVENOUS
  Filled 2021-06-22: qty 250

## 2021-06-22 NOTE — TOC Benefit Eligibility Note (Signed)
Patient Product/process development scientist completed.    The patient is currently admitted and upon discharge could be taking Eliquis Starter Pack.  The current 30 day co-pay is, $0.00.   The patient is insured through [employer] [facility]     Roland Earl, CPhT Pharmacy Patient Advocate Specialist [facility] Patient Advocate Team Direct Number: [phone]  Fax: [phone]

## 2021-06-22 NOTE — Progress Notes (Signed)
PROGRESS NOTE    Linda Gill  LPF:[id number] DOB: 01-Apr-1976 DOA: 06/20/2021 PCP: Tracey Harries, MD   Chief Complaint  Patient presents with   Shortness of Breath  Brief Narrative/Hospital Course:  Linda Gill, 45 y.o. female with PMH of  hip surgery for labral tear on June 05, 2021 discharged on aspirin 325 for DVT prophylaxis presents to the ED with shortness of breath x6 days with decreased mobility.  Seen by PCP and had CT angio and was told that she has bilateral PE no heart  STRAIN and sent to the ED, imaging/report not available. In the ED found to have bilateral DVTs, patient was placed on anticoagulation and admitted for further management She has a printed report at bedside form [facility]: clots present Near origin of lower arteries to rt upper lobe and rt lower lobe extends into some segmental and or subsegmental branch, moderate clot burden.  Subjective: She complains of ongoing left leg pain needing pain medication Doing well on room air. Remains on heparin drip.  Assessment & Plan:  Pulmonary embolism Bilateral DVT of the posterior tibial veins:  Will continue on heparin drip and drip x48 hours then transition to Eliquis-discussed the risks benefits alternatives.  Currently not hypoxic.  Likely it is provoked VTE events due to recent hip surgery, in the setting of oral contraceptive pills.High sensitive troponin negative x2  echocardiogram with no right heart strain.  Continue hydrocodone for left leg pain.  Mild leukocytosis: Likely reactive.improving, afebrile.  RA:She is on infusion q 8 wk,had on Monday.  Class I Obesity:Patient's Body mass index is 32.48 kg/m. Will benefit with PCP follow-up, weight loss  healthy lifestyle and outpatient sleep evaluation.  DVT prophylaxis:  Code Status:   Code Status: Full Code Family Communication: plan of care discussed with patient at bedside. Status is: Admitted as observation Remains hospitalized and will  need at least 2 midnights for ongoing IV anticoagulation for bilateral DVT and PE, as she is symptomatic with dyspnea on exertion/left leg pain. Currently not medically stable for discharge Anticipate discharge tomorrow if remains a stable. Objective: Vitals last 24 hrs: Vitals:   06/21/21 1221 06/21/21 2013 06/22/21 0349 06/22/21 0758  BP: 126/82 119/73 119/71 118/79  Pulse: 70 62 61 66  Resp: 18 18 17 18   Temp: 98.4 F (36.9 C) 97.8 F (36.6 C) 97.8 F (36.6 C) 97.8 F (36.6 C)  TempSrc: Oral Oral Oral Oral  SpO2: 97% 97% 98% 96%  Weight:      Height:       Weight change:   Intake/Output Summary (Last 24 hours) at 06/22/2021 1322 Last data filed at 06/22/2021 1230 Gross per 24 hour  Intake 2003.44 ml  Output --  Net 2003.44 ml   Net IO Since Admission: 2,003.44 mL [06/22/21 1322]   Physical Examination: General exam: AAOx 3, pleasant, on RA HEENT:Oral mucosa moist, Ear/Nose WNL grossly, dentition normal. Respiratory system: bilaterally CLEAR, no use of accessory muscle Cardiovascular system: S1 & S2 +, No JVD,. Gastrointestinal system: Abdomen soft,NT,ND, BS+ Nervous System:Alert, awake, moving extremities and grossly nonfocal Extremities: LE edema+  distal peripheral pulses palpable.  Skin: No rashes,no icterus. MSK: Normal muscle bulk,tone, power   Medications reviewed:  Scheduled Meds:  apixaban  10 mg Oral BID   Followed by   13/03/22 ON 06/29/2021] apixaban  5 mg Oral BID   docusate sodium  100 mg Oral BID   FLUoxetine  20 mg Oral Daily   montelukast  10 mg  Oral Daily   senna  1 tablet Oral BID   Continuous Infusions:  heparin 2,300 Units/hr (06/22/21 0919)   methocarbamol (ROBAXIN) IV      Diet Order             Diet Heart Room service appropriate? Yes; Fluid consistency: Thin  Diet effective now                 Weight change:   Wt Readings from Last 3 Encounters:  06/20/21 114.8 kg  06/05/21 113 kg  06/02/21 113.4 kg     Consultants:see  note  Procedures:see note Antimicrobials: Anti-infectives (From admission, onward)    None      Culture/Microbiology No results found for: SDES, SPECREQUEST, CULT, REPTSTATUS  Other culture-see note  Unresulted Labs (From admission, onward)     Start     Ordered   06/22/21 1500  Heparin level (unfractionated)  Once-Timed,   TIMED        06/22/21 0830   06/22/21 0500  Heparin level (unfractionated)  Daily,   R     See Hyperspace for full Linked Orders Report.   06/21/21 0044   06/22/21 0500  Basic metabolic panel  Daily,   R     Question:  Specimen collection method  Answer:  Lab=Lab collect   06/21/21 1152   06/22/21 0500  CBC  Daily,   R     Question:  Specimen collection method  Answer:  Lab=Lab collect   06/21/21 1152   06/21/21 0500  CBC  Daily,   R     See Hyperspace for full Linked Orders Report.   06/21/21 0044          Data Reviewed: I have personally reviewed following labs and imaging studies CBC: Recent Labs  Lab 06/20/21 1509 06/21/21 0113 06/21/21 0759 06/22/21 0257  WBC 12.7* 11.9* 9.5 10.8*  NEUTROABS 6.4  --  4.3  --   HGB 12.6 12.4 12.8 12.1  HCT 37.5 37.9 38.1 36.5  MCV 88.9 90.2 88.6 89.0  PLT 282 268 294 273    Basic Metabolic Panel: Recent Labs  Lab 06/20/21 1509 06/21/21 0759 06/22/21 0257  NA 137 136 139  K 4.1 3.8 4.0  CL 105 106 109  CO2 23 21* 24  GLUCOSE 88 92 101*  BUN CREATININE 0.61 0.57 0.63  CALCIUM 8.6* 8.4* 8.4*  MG  --  2.1  --   PHOS  --  2.4*  --    GFR: Estimated Creatinine Clearance: 126.9 mL/min (by C-G formula based on SCr of 0.63 mg/dL). Liver Function Tests: Recent Labs  Lab 06/21/21 0759  AST 24  ALT 23  ALKPHOS 52  BILITOT 0.6  PROT 6.8  ALBUMIN 3.3*   No results for input(s): LIPASE, AMYLASE in the last 168 hours. No results for input(s): AMMONIA in the last 168 hours. Coagulation Profile: Recent Labs  Lab 06/20/21 1607  INR 1.0    Cardiac Enzymes: No results for input(s):  CKTOTAL, CKMB, CKMBINDEX, TROPONINI in the last 168 hours. BNP (last 3 results) No results for input(s): PROBNP in the last 8760 hours. HbA1C: No results for input(s): HGBA1C in the last 72 hours. CBG: No results for input(s): GLUCAP in the last 168 hours. Lipid Profile: No results for input(s): CHOL, HDL, LDLCALC, TRIG, CHOLHDL, LDLDIRECT in the last 72 hours. Thyroid Function Tests: Recent Labs    06/21/21 0759  TSH 2.305   Anemia Panel: No results  for input(s): VITAMINB12, FOLATE, FERRITIN, TIBC, IRON, RETICCTPCT in the last 72 hours. Sepsis Labs: No results for input(s): PROCALCITON, LATICACIDVEN in the last 168 hours.  Recent Results (from the past 240 hour(s))  Resp Panel by RT-PCR (Flu A&B, Covid) Nasopharyngeal Swab     Status: None   Collection Time: 06/20/21  3:11 PM   Specimen: Nasopharyngeal Swab; Nasopharyngeal(NP) swabs in vial transport medium  Result Value Ref Range Status   SARS Coronavirus 2 by RT PCR NEGATIVE NEGATIVE Final    Comment: (NOTE) SARS-CoV-2 target nucleic acids are NOT DETECTED.  The SARS-CoV-2 RNA is generally detectable in upper respiratory specimens during the acute phase of infection. The lowest concentration of SARS-CoV-2 viral copies this assay can detect is 138 copies/mL. A negative result does not preclude SARS-Cov-2 infection and should not be used as the sole basis for treatment or other patient management decisions. A negative result may occur with  improper specimen collection/handling, submission of specimen other than nasopharyngeal swab, presence of viral mutation(s) within the areas targeted by this assay, and inadequate number of viral copies(<138 copies/mL). A negative result must be combined with clinical observations, patient history, and epidemiological information. The expected result is Negative.  Fact Sheet for Patients:  BloggerCourse.com  Fact Sheet for Healthcare Providers:   SeriousBroker.it  This test is no t yet approved or cleared by the [place] FDA and  has been authorized for detection and/or diagnosis of SARS-CoV-2 by FDA under an Emergency Use Authorization (EUA). This EUA will remain  in effect (meaning this test can be used) for the duration of the COVID-19 declaration under Section 564(b)(1) of the Act, 21 U.S.C.section 360bbb-3(b)(1), unless the authorization is terminated  or revoked sooner.       Influenza A by PCR NEGATIVE NEGATIVE Final   Influenza B by PCR NEGATIVE NEGATIVE Final    Comment: (NOTE) The Xpert Xpress SARS-CoV-2/FLU/RSV plus assay is intended as an aid in the diagnosis of influenza from Nasopharyngeal swab specimens and should not be used as a sole basis for treatment. Nasal washings and aspirates are unacceptable for Xpert Xpress SARS-CoV-2/FLU/RSV testing.  Fact Sheet for Patients: BloggerCourse.com  Fact Sheet for Healthcare Providers: SeriousBroker.it  This test is not yet approved or cleared by the [place] FDA and has been authorized for detection and/or diagnosis of SARS-CoV-2 by FDA under an Emergency Use Authorization (EUA). This EUA will remain in effect (meaning this test can be used) for the duration of the COVID-19 declaration under Section 564(b)(1) of the Act, 21 U.S.C. section 360bbb-3(b)(1), unless the authorization is terminated or revoked.  Performed at [facility], [address], [place], [place] [address]       Radiology Studies: ECHOCARDIOGRAM COMPLETE  Result Date: 06/21/2021    ECHOCARDIOGRAM REPORT   Patient Name:   Linda Gill Date of Exam: 06/21/2021 Medical Rec #:  [id number]        Height:       74.0 in Accession #:    [id number]       Weight:       253.0 lb Date of Birth:  [birth year]        BSA:          2.402 m Patient Age:    45 years         BP:           106/72 mmHg Patient Gender: F  HR:           65 bpm. Exam Location:  Inpatient Procedure: 2D Echo, Cardiac Doppler and Color Doppler Indications:    Pulmonary Emblous  History:        Patient has no prior history of Echocardiogram examinations.                 Pulmonary Emblous                 DVT.  Sonographer:    Vanetta Shawl Referring Phys: [id number] ANASTASSIA DOUTOVA IMPRESSIONS  1. Poor windows, not all walls are visualized well, globally does not appear to have significant wall motion abnormalities. Left ventricular ejection fraction, by estimation, is 60 to 65%. The left ventricle has normal function. Left ventricular diastolic parameters are indeterminate.  2. No signs of RV strain. Right ventricular systolic function is normal. The right ventricular size is normal.  3. The mitral valve was not well visualized. not well visualized mitral valve regurgitation. No evidence of mitral stenosis.  4. Tricuspid valve regurgitation not well visualized.  5. The aortic valve is normal in structure. Aortic valve regurgitation is not visualized. No aortic stenosis is present.  6. Pulmonic valve regurgitation not well visualized.  7. The inferior vena cava is normal in size with greater than 50% respiratory variability, suggesting right atrial pressure of 3 mmHg. Comparison(s): No prior Echocardiogram. Conclusion(s)/Recommendation(s): Poor windows, LV normal size and function. No signs of significant RV strain or pressure overload. FINDINGS  Left Ventricle: Poor windows, not all walls are visualized well, globally does not appear to have significant wall motion abnormalities. Left ventricular ejection fraction, by estimation, is 60 to 65%. The left ventricle has normal function. The left ventricular internal cavity size was normal in size. There is no left ventricular hypertrophy. Left ventricular diastolic parameters are indeterminate. Right Ventricle: No signs of RV strain. The right ventricular size is normal. Right ventricular systolic  function is normal. Left Atrium: Left atrial size was normal in size. Right Atrium: Right atrial size was normal in size. Pericardium: There is no evidence of pericardial effusion. Mitral Valve: The mitral valve was not well visualized. Not well visualized mitral valve regurgitation. No evidence of mitral valve stenosis. Tricuspid Valve: The tricuspid valve is not well visualized. Tricuspid valve regurgitation not well visualized. Aortic Valve: The aortic valve is normal in structure. Aortic valve regurgitation is not visualized. No aortic stenosis is present. Aortic valve mean gradient measures 3.0 mmHg. Aortic valve peak gradient measures 5.0 mmHg. Pulmonic Valve: The pulmonic valve was not well visualized. Pulmonic valve regurgitation not well visualized. Aorta: Aortic root could not be assessed. Venous: The inferior vena cava is normal in size with greater than 50% respiratory variability, suggesting right atrial pressure of 3 mmHg.  LEFT VENTRICLE PLAX 2D LVIDd:         4.50 cm Diastology LVIDs:         2.80 cm LV e' medial:    9.14 cm/s LV PW:         1.10 cm LV E/e' medial:  9.6 LV IVS:        1.00 cm LV e' lateral:   12.10 cm/s                        LV E/e' lateral: 7.2  RIGHT VENTRICLE             IVC RV S prime:     11.60 cm/s  IVC diam: 1.80 cm LEFT ATRIUM             Index LA diam:        3.60 cm 1.50 cm/m LA Vol (A2C):   86.0 ml 35.81 ml/m LA Vol (A4C):   40.8 ml 16.99 ml/m LA Biplane Vol: 59.3 ml 24.69 ml/m  AORTIC VALVE                   PULMONIC VALVE AV Vmax:           112.00 cm/s PV Vmax:       1.10 m/s AV Vmean:          76.200 cm/s PV Peak grad:  4.8 mmHg AV VTI:            0.265 m AV Peak Grad:      5.0 mmHg AV Mean Grad:      3.0 mmHg LVOT Vmax:         102.00 cm/s LVOT Vmean:        62.000 cm/s LVOT VTI:          0.229 m LVOT/AV VTI ratio: 0.86 MITRAL VALVE               TRICUSPID VALVE MV Area (PHT): 3.66 cm    TR Peak grad:   33.4 mmHg MV Decel Time: 207 msec    TR Vmax:        289.00  cm/s MV E velocity: 87.40 cm/s MV A velocity: 83.60 cm/s  SHUNTS MV E/A ratio:  1.05        Systemic VTI: 0.23 m Carolan Clines Electronically signed by Carolan Clines Signature Date/Time: 06/21/2021/12:59:31 PM    Final    VAS [place] LOWER EXTREMITY VENOUS (DVT) (ONLY [facility] & [facility])  Result Date: 06/21/2021  Lower Venous DVT Study Patient Name:  Linda Gill  Date of Exam:   06/20/2021 Medical Rec #: [id number]         Accession #:    [id number] Date of Birth: 12-23-1975         Patient Gender: F Patient Age:   33 years Exam Location:  [facility] Procedure:      VAS [place] LOWER EXTREMITY VENOUS (DVT) Referring Phys: Claudette Stapler --------------------------------------------------------------------------------  Indications: Pulmonary embolism, SOB, and bilateral calf pain s/p hip surgery.  Risk Factors: Surgery 06-05-2021 Left hip arthrosopy. Anticoagulation: Started on heparin 06-20-2021. Comparison Study: 06-20-2021 CTA of the chest performed at [facility]:                   Bilateral pulmonary embolism is present. The most proximal                   clot is near the origin of the lower arteries to the right                   upper lobe and right lower lobe. Clot extends into some of the                   segmental or subsegmental branch vessels, overall clot burden                   moderate. The left upper lobe and the right middle lobe appear                   relatively spared. Main pulmonary artery is normal in  diameter. Performing Technologist: Jean Rosenthal RDMS, RVT  Examination Guidelines: A complete evaluation includes B-mode imaging, spectral Doppler, color Doppler, and power Doppler as needed of all accessible portions of each vessel. Bilateral testing is considered an integral part of a complete examination. Limited examinations for reoccurring indications may be performed as noted. The reflux portion of the exam is performed with the patient in reverse Trendelenburg.   +---------+---------------+---------+-----------+----------+--------------+ RIGHT    CompressibilityPhasicitySpontaneityPropertiesThrombus Aging +---------+---------------+---------+-----------+----------+--------------+ CFV      Full           Yes      Yes                                 +---------+---------------+---------+-----------+----------+--------------+ SFJ      Full                                                        +---------+---------------+---------+-----------+----------+--------------+ FV Prox  Full                                                        +---------+---------------+---------+-----------+----------+--------------+ FV Mid   Full                                                        +---------+---------------+---------+-----------+----------+--------------+ FV DistalFull                                                        +---------+---------------+---------+-----------+----------+--------------+ PFV      Full                                                        +---------+---------------+---------+-----------+----------+--------------+ POP      Full           Yes      Yes                                 +---------+---------------+---------+-----------+----------+--------------+ PTV      None           No       No                   Acute          +---------+---------------+---------+-----------+----------+--------------+ PERO     Full                                                        +---------+---------------+---------+-----------+----------+--------------+  Gastroc  Full                                                        +---------+---------------+---------+-----------+----------+--------------+   +---------+---------------+---------+-----------+----------+--------------+ LEFT     CompressibilityPhasicitySpontaneityPropertiesThrombus Aging  +---------+---------------+---------+-----------+----------+--------------+ CFV      Full           Yes      Yes                                 +---------+---------------+---------+-----------+----------+--------------+ SFJ      Full                                                        +---------+---------------+---------+-----------+----------+--------------+ FV Prox  Full                                                        +---------+---------------+---------+-----------+----------+--------------+ FV Mid   Full                                                        +---------+---------------+---------+-----------+----------+--------------+ FV DistalFull                                                        +---------+---------------+---------+-----------+----------+--------------+ PFV      Full                                                        +---------+---------------+---------+-----------+----------+--------------+ POP      Full           Yes      Yes                                 +---------+---------------+---------+-----------+----------+--------------+ PTV      None           No       No                   Acute          +---------+---------------+---------+-----------+----------+--------------+ PERO     Full                                                        +---------+---------------+---------+-----------+----------+--------------+  Gastroc  Full                                                        +---------+---------------+---------+-----------+----------+--------------+     Summary: RIGHT: - Findings consistent with acute deep vein thrombosis involving the right posterior tibial veins.  LEFT: - Findings consistent with acute deep vein thrombosis involving the left posterior tibial veins.  *See table(s) above for measurements and observations. Electronically signed by Waverly Ferrari MD on 06/21/2021 at 6:48:43 AM.     Final      LOS: 0 days   Lanae Boast, MD [facility]  06/22/2021, 1:22 PM

## 2021-06-22 NOTE — Discharge Instructions (Signed)
Information on my medicine - ELIQUIS (apixaban)  This medication education was reviewed with me or my healthcare representative as part of my discharge preparation.   Why was Eliquis prescribed for you? Eliquis was prescribed to treat blood clots that may have been found in the veins of your legs (deep vein thrombosis) or in your lungs (pulmonary embolism) and to reduce the risk of them occurring again.  What do You need to know about Eliquis ? The starting dose is 10 mg (two 5 mg tablets) taken TWICE daily for the FIRST SEVEN (7) DAYS, then on 06/29/2021  the dose is reduced to ONE 5 mg tablet taken TWICE daily.  Eliquis may be taken with or without food.   Try to take the dose about the same time in the morning and in the evening. If you have difficulty swallowing the tablet whole please discuss with your pharmacist how to take the medication safely.  Take Eliquis exactly as prescribed and DO NOT stop taking Eliquis without talking to the doctor who prescribed the medication.  Stopping may increase your risk of developing a new blood clot.  Refill your prescription before you run out.  After discharge, you should have regular check-up appointments with your healthcare provider that is prescribing your Eliquis.    What do you do if you miss a dose? If a dose of ELIQUIS is not taken at the scheduled time, take it as soon as possible on the same day and twice-daily administration should be resumed. The dose should not be doubled to make up for a missed dose.  Important Safety Information A possible side effect of Eliquis is bleeding. You should call your healthcare provider right away if you experience any of the following: Bleeding from an injury or your nose that does not stop. Unusual colored urine (red or dark brown) or unusual colored stools (red or black). Unusual bruising for unknown reasons. A serious fall or if you hit your head (even if there is no bleeding).  Some  medicines may interact with Eliquis and might increase your risk of bleeding or clotting while on Eliquis. To help avoid this, consult your healthcare provider or pharmacist prior to using any new prescription or non-prescription medications, including herbals, vitamins, non-steroidal anti-inflammatory drugs (NSAIDs) and supplements.  This website has more information on Eliquis (apixaban): http://www.eliquis.com/eliquis/home

## 2021-06-22 NOTE — Plan of Care (Signed)

## 2021-06-22 NOTE — Plan of Care (Signed)

## 2021-06-22 NOTE — Progress Notes (Signed)
ANTICOAGULATION CONSULT NOTE Pharmacy Consult for Heparin and Apixaban Indication:  bilateral PEs, bilateral calf DVTs  Allergies  Allergen Reactions   Moxifloxacin Rash   Penicillins Rash    Tolerated Ancef 2g IV on 06/05/2021   Shellfish Allergy Rash    Not allergic to iodine   Sulfa Antibiotics Rash    Patient Measurements: Height: 6\' 2"  (188 cm) Weight: 114.8 kg (253 lb) IBW/kg (Calculated) : 77.7  Heparin Dosing Weight: 102.4 kg  Vital Signs: Temp: 97.8 F (36.6 C) (11/03 0758) Temp Source: Oral (11/03 0758) BP: 118/79 (11/03 0758) Pulse Rate: 66 (11/03 0758)  Labs: Recent Labs    06/20/21 1509 06/20/21 1607 06/20/21 1709 06/21/21 0113 06/21/21 0759 06/21/21 0902 06/22/21 0257  HGB 12.6  --   --  12.4 12.8  --  12.1  HCT 37.5  --   --  37.9 38.1  --  36.5  PLT 282  --   --  268 294  --  273  LABPROT  --  13.3  --   --   --   --   --   INR  --  1.0  --   --   --   --   --   HEPARINUNFRC  --   --   --  0.14*  --  0.32 0.27*  CREATININE 0.61  --   --   --  0.57  --  0.63  TROPONINIHS <2  --  <2  --   --   --   --     Estimated Creatinine Clearance: 126.9 mL/min (by C-G formula based on SCr of 0.63 mg/dL).   Medications:  Infusions:   heparin 2,100 Units/hr (06/22/21 0310)   methocarbamol (ROBAXIN) IV      Assessment: 83 yof with a history of RA and depression. Patient is presenting with bilateral PE and bilateral DVTs. Heparin per pharmacy consult placed for PEs and DVTs.    Heparin level low at 0.27 on 2100 units/hour. Will give x1 bolus and increase heparin infusion to 2300 units/hour. CBC stable, per discussion with RN, no issues with infusion and no s/sx bleeding.   Heparin infusion started 11/01 @ 1730. Will continue heparin infusion until 11/03 1800 then transition patient to Apixaban.  Goal of Therapy:  Heparin level 0.3-0.7 units/ml Monitor platelets by anticoagulation protocol: Yes   Plan:  Give x1 bolus hearin 1500 units and increase  heparin infusion to 2300 units/hr Stop heparin infusion at 1800 and give 1st dose of Apixaban Check heparin level in 6 hours and daily while on heparin Continue to monitor H&H and platelets    Thank you for allowing pharmacy to be a part of this patient's care.  13/03, PharmD Clinical Pharmacist

## 2021-06-23 ENCOUNTER — Other Ambulatory Visit (HOSPITAL_COMMUNITY): Payer: Self-pay

## 2021-06-23 DIAGNOSIS — I82463 Acute embolism and thrombosis of calf muscular vein, bilateral: Secondary | ICD-10-CM | POA: Diagnosis not present

## 2021-06-23 LAB — CBC
HCT: 39.4 % (ref 36.0–46.0)
Hemoglobin: 12.9 g/dL (ref 12.0–15.0)
MCH: 29.1 pg (ref 26.0–34.0)
MCHC: 32.7 g/dL (ref 30.0–36.0)
MCV: 88.7 fL (ref 80.0–100.0)
Platelets: 293 10*3/uL (ref 150–400)
RBC: 4.44 MIL/uL (ref 3.87–5.11)
RDW: 13.1 % (ref 11.5–15.5)
WBC: 10.1 10*3/uL (ref 4.0–10.5)
nRBC: 0 % (ref 0.0–0.2)

## 2021-06-23 MED ORDER — APIXABAN 5 MG PO TABS
5.0000 mg | ORAL_TABLET | Freq: Two times a day (BID) | ORAL | 0 refills | Status: DC
  Filled 2021-06-23: qty 60, 30d supply, fill #0

## 2021-06-23 MED ORDER — APIXABAN (ELIQUIS) VTE STARTER PACK (10MG AND 5MG)
10.0000 mg | ORAL_TABLET | Freq: Two times a day (BID) | ORAL | 0 refills | Status: DC
  Filled 2021-06-23: qty 74, 30d supply, fill #0

## 2021-06-23 NOTE — Progress Notes (Signed)
Discharge instruction given to the patient and questions and concerns answered. Patient talked to her car, accompanied by this nurse without no distress. Patient hemodynamically stable during discharge.

## 2021-06-23 NOTE — Discharge Summary (Signed)
Physician Discharge Summary  Linda Gill ZOX:[id number] DOB: 09/05/1975 DOA: 06/20/2021  PCP: Tracey Harries, MD  Admit date: 06/20/2021 Discharge date: 06/23/2021  Admitted From: home Disposition:  home  Recommendations for Outpatient Follow-up:  Follow up with PCP in 1-2 weeks Please obtain BMP/CBC in one week Please follow up on the following pending results:  Home Health:no  Equipment/Devices: none  Discharge Condition: Stable Code Status:   Code Status: Full Code Diet recommendation:  Diet Order             Diet Heart Room service appropriate? Yes; Fluid consistency: Thin  Diet effective now                    Brief/Interim Summary: Linda Gill, 45 y.o. female with PMH of  hip surgery for labral tear on June 05, 2021 discharged on aspirin 325 for DVT prophylaxis presents to the ED with shortness of breath x6 days with decreased mobility.  Seen by PCP and had CT angio and was told that she has bilateral PE no heart  STRAIN and sent to the ED, imaging/report not available. In the ED found to have bilateral DVTs, patient was placed on anticoagulation and admitted for further management She has a printed report at bedside form [facility]: clots present Near origin of lower arteries to rt upper lobe and rt lower lobe extends into some segmental and or subsegmental branch, moderate clot burden. Patient was continued on heparin drip x48 hours then transition to Eliquis.  She had some pain on the left leg otherwise no respiratory symptoms.  Monitor overnight after starting oral anticoagulation to ensure she is safe for discharge home  Discharge Diagnoses:   Pulmonary embolism Bilateral DVT of the posterior tibial veins:  Completed 48 hours of heparin drip transition to oral Eliquis.  Overall stable for discharge home.likely it is provoked VTE events due to recent hip surgery, in the setting of oral contraceptive pills.High sensitive troponin negative x2   echocardiogram with no right heart strain.  Continue hydrocodone for left leg pain. She will tal to her pcp regarding her OCP and alternative methods. Will also stop her nsaid.   Mild leukocytosis: Likely reactive.improving, afebrile.   RA:She is on infusion q 8 wk,had on Monday.   Class I Obesity:Patient's Body mass index is 32.48 kg/m. Will benefit with PCP follow-up, weight loss  healthy lifestyle and outpatient sleep evaluation.   Consults: toc  Subjective: Feels well, mild leg pain, attributes to anxiety. No cough chest pain, doing well on RA Feels ready for d/c  Discharge Exam: Vitals:   06/22/21 0758 06/22/21 1929  BP: 118/79 116/79  Pulse: 66 62  Resp: 18 18  Temp: 97.8 F (36.6 C) 97.8 F (36.6 C)  SpO2: 96% 98%   General: Pt is alert, awake, not in acute distress Cardiovascular: RRR, S1/S2 +, no rubs, no gallops Respiratory: CTA bilaterally, no wheezing, no rhonchi Abdominal: Soft, NT, ND, bowel sounds + Extremities: no edema, no cyanosis  Discharge Instructions  Discharge Instructions     Call MD for:  severe uncontrolled pain   Complete by: As directed    Discharge instructions   Complete by: As directed    Please call call MD or return to ER for similar or worsening recurring problem that brought you to hospital or if any fever,nausea/vomiting,abdominal pain, uncontrolled pain, chest pain,  shortness of breath or any other alarming symptoms.  Please follow-up your pcp I na week and d/w your  anticoagulation   Please avoid alcohol, smoking, or any other illicit substance and maintain healthy habits including taking your regular medications as prescribed.  You were cared for by a hospitalist during your hospital stay. If you have any questions about your discharge medications or the care you received while you were in the hospital after you are discharged, you can call the unit and ask to speak with the hospitalist on call if the hospitalist that took care  of you is not available.  Once you are discharged, your primary care physician will handle any further medical issues. Please note that NO REFILLS for any discharge medications will be authorized once you are discharged, as it is imperative that you return to your primary care physician (or establish a relationship with a primary care physician if you do not have one) for your aftercare needs so that they can reassess your need for medications and monitor your lab values   Increase activity slowly   Complete by: As directed    No wound care   Complete by: As directed       Allergies as of 06/23/2021       Reactions   Moxifloxacin Rash   Penicillins Rash   Tolerated Ancef 2g IV on 06/05/2021   Shellfish Allergy Rash   Not allergic to iodine   Sulfa Antibiotics Rash        Medication List     STOP taking these medications    aspirin EC 325 MG tablet   meloxicam 15 MG tablet Commonly known as: MOBIC   norethindrone-ethinyl estradiol-FE 1-20 MG-MCG tablet Commonly known as: LOESTRIN FE       TAKE these medications    apixaban 5 MG Tabs tablet Commonly known as: ELIQUIS Take 2 tablets (10 mg total) by mouth 2 (two) times daily for 6 days.   apixaban 5 MG Tabs tablet Commonly known as: ELIQUIS Take 1 tablet (5 mg total) by mouth 2 (two) times daily. Start taking on: June 29, 2021   FLUoxetine 20 MG capsule Commonly known as: PROZAC Take 20 mg by mouth daily.   montelukast 10 MG tablet Commonly known as: SINGULAIR Take 10 mg by mouth daily.   oxycodone 5 MG capsule Commonly known as: OXY-IR Take 1 capsule (5 mg total) by mouth every 4 (four) hours as needed (severe pain).   oxyCODONE 5 MG immediate release tablet Commonly known as: Oxy IR/ROXICODONE Take 1 tablet (5 mg total) by mouth every 4 (four) hours as needed for severe pain.   vitamin B-12 1000 MCG tablet Commonly known as: CYANOCOBALAMIN Take 2,000 mcg by mouth daily.   vitamin C 1000 MG  tablet Take 1,000 mg by mouth daily.   VITAMIN D3 PO Take 7,000 Units by mouth daily.   vitamin E 180 MG (400 UNITS) capsule Take 400 Units by mouth daily.   zinc gluconate 50 MG tablet Take 50 mg by mouth daily.        Allergies  Allergen Reactions   Moxifloxacin Rash   Penicillins Rash    Tolerated Ancef 2g IV on 06/05/2021   Shellfish Allergy Rash    Not allergic to iodine   Sulfa Antibiotics Rash    The results of significant diagnostics from this hospitalization (including imaging, microbiology, ancillary and laboratory) are listed below for reference.    Microbiology: Recent Results (from the past 240 hour(s))  Resp Panel by RT-PCR (Flu A&B, Covid) Nasopharyngeal Swab     Status: None   Collection  Time: 06/20/21  3:11 PM   Specimen: Nasopharyngeal Swab; Nasopharyngeal(NP) swabs in vial transport medium  Result Value Ref Range Status   SARS Coronavirus 2 by RT PCR NEGATIVE NEGATIVE Final    Comment: (NOTE) SARS-CoV-2 target nucleic acids are NOT DETECTED.  The SARS-CoV-2 RNA is generally detectable in upper respiratory specimens during the acute phase of infection. The lowest concentration of SARS-CoV-2 viral copies this assay can detect is 138 copies/mL. A negative result does not preclude SARS-Cov-2 infection and should not be used as the sole basis for treatment or other patient management decisions. A negative result may occur with  improper specimen collection/handling, submission of specimen other than nasopharyngeal swab, presence of viral mutation(s) within the areas targeted by this assay, and inadequate number of viral copies(<138 copies/mL). A negative result must be combined with clinical observations, patient history, and epidemiological information. The expected result is Negative.  Fact Sheet for Patients:  BloggerCourse.com  Fact Sheet for Healthcare Providers:  SeriousBroker.it  This test  is no t yet approved or cleared by the [place] FDA and  has been authorized for detection and/or diagnosis of SARS-CoV-2 by FDA under an Emergency Use Authorization (EUA). This EUA will remain  in effect (meaning this test can be used) for the duration of the COVID-19 declaration under Section 564(b)(1) of the Act, 21 U.S.C.section 360bbb-3(b)(1), unless the authorization is terminated  or revoked sooner.       Influenza A by PCR NEGATIVE NEGATIVE Final   Influenza B by PCR NEGATIVE NEGATIVE Final    Comment: (NOTE) The Xpert Xpress SARS-CoV-2/FLU/RSV plus assay is intended as an aid in the diagnosis of influenza from Nasopharyngeal swab specimens and should not be used as a sole basis for treatment. Nasal washings and aspirates are unacceptable for Xpert Xpress SARS-CoV-2/FLU/RSV testing.  Fact Sheet for Patients: BloggerCourse.com  Fact Sheet for Healthcare Providers: SeriousBroker.it  This test is not yet approved or cleared by the [place] FDA and has been authorized for detection and/or diagnosis of SARS-CoV-2 by FDA under an Emergency Use Authorization (EUA). This EUA will remain in effect (meaning this test can be used) for the duration of the COVID-19 declaration under Section 564(b)(1) of the Act, 21 U.S.C. section 360bbb-3(b)(1), unless the authorization is terminated or revoked.  Performed at [facility], [address] [place], [place] [address]     Procedures/Studies: DG C-Arm 1-60 Min-No Report  Result Date: 06/05/2021 Fluoroscopy was utilized by the requesting physician.  No radiographic interpretation.   DG C-Arm 1-60 Min-No Report  Result Date: 06/05/2021 Fluoroscopy was utilized by the requesting physician.  No radiographic interpretation.   DG C-Arm 1-60 Min-No Report  Result Date: 06/05/2021 Fluoroscopy was utilized by the requesting physician.  No radiographic interpretation.    DG C-Arm 1-60 Min-No Report  Result Date: 06/05/2021 Fluoroscopy was utilized by the requesting physician.  No radiographic interpretation.   ECHOCARDIOGRAM COMPLETE  Result Date: 06/21/2021    ECHOCARDIOGRAM REPORT   Patient Name:   SYMIAH NOWOTNY Date of Exam: 06/21/2021 Medical Rec #:  [id number]        Height:       74.0 in Accession #:    [id number]       Weight:       253.0 lb Date of Birth:  04-Mar-1976        BSA:          2.402 m Patient Age:    45 years  BP:           106/72 mmHg Patient Gender: F                HR:           65 bpm. Exam Location:  Inpatient Procedure: 2D Echo, Cardiac Doppler and Color Doppler Indications:    Pulmonary Emblous  History:        Patient has no prior history of Echocardiogram examinations.                 Pulmonary Emblous                 DVT.  Sonographer:    Vanetta Shawl Referring Phys: [id number] ANASTASSIA DOUTOVA IMPRESSIONS  1. Poor windows, not all walls are visualized well, globally does not appear to have significant wall motion abnormalities. Left ventricular ejection fraction, by estimation, is 60 to 65%. The left ventricle has normal function. Left ventricular diastolic parameters are indeterminate.  2. No signs of RV strain. Right ventricular systolic function is normal. The right ventricular size is normal.  3. The mitral valve was not well visualized. not well visualized mitral valve regurgitation. No evidence of mitral stenosis.  4. Tricuspid valve regurgitation not well visualized.  5. The aortic valve is normal in structure. Aortic valve regurgitation is not visualized. No aortic stenosis is present.  6. Pulmonic valve regurgitation not well visualized.  7. The inferior vena cava is normal in size with greater than 50% respiratory variability, suggesting right atrial pressure of 3 mmHg. Comparison(s): No prior Echocardiogram. Conclusion(s)/Recommendation(s): Poor windows, LV normal size and function. No signs of significant RV strain or  pressure overload. FINDINGS  Left Ventricle: Poor windows, not all walls are visualized well, globally does not appear to have significant wall motion abnormalities. Left ventricular ejection fraction, by estimation, is 60 to 65%. The left ventricle has normal function. The left ventricular internal cavity size was normal in size. There is no left ventricular hypertrophy. Left ventricular diastolic parameters are indeterminate. Right Ventricle: No signs of RV strain. The right ventricular size is normal. Right ventricular systolic function is normal. Left Atrium: Left atrial size was normal in size. Right Atrium: Right atrial size was normal in size. Pericardium: There is no evidence of pericardial effusion. Mitral Valve: The mitral valve was not well visualized. Not well visualized mitral valve regurgitation. No evidence of mitral valve stenosis. Tricuspid Valve: The tricuspid valve is not well visualized. Tricuspid valve regurgitation not well visualized. Aortic Valve: The aortic valve is normal in structure. Aortic valve regurgitation is not visualized. No aortic stenosis is present. Aortic valve mean gradient measures 3.0 mmHg. Aortic valve peak gradient measures 5.0 mmHg. Pulmonic Valve: The pulmonic valve was not well visualized. Pulmonic valve regurgitation not well visualized. Aorta: Aortic root could not be assessed. Venous: The inferior vena cava is normal in size with greater than 50% respiratory variability, suggesting right atrial pressure of 3 mmHg.  LEFT VENTRICLE PLAX 2D LVIDd:         4.50 cm Diastology LVIDs:         2.80 cm LV e' medial:    9.14 cm/s LV PW:         1.10 cm LV E/e' medial:  9.6 LV IVS:        1.00 cm LV e' lateral:   12.10 cm/s  LV E/e' lateral: 7.2  RIGHT VENTRICLE             IVC RV S prime:     11.60 cm/s  IVC diam: 1.80 cm LEFT ATRIUM             Index LA diam:        3.60 cm 1.50 cm/m LA Vol (A2C):   86.0 ml 35.81 ml/m LA Vol (A4C):   40.8 ml 16.99  ml/m LA Biplane Vol: 59.3 ml 24.69 ml/m  AORTIC VALVE                   PULMONIC VALVE AV Vmax:           112.00 cm/s PV Vmax:       1.10 m/s AV Vmean:          76.200 cm/s PV Peak grad:  4.8 mmHg AV VTI:            0.265 m AV Peak Grad:      5.0 mmHg AV Mean Grad:      3.0 mmHg LVOT Vmax:         102.00 cm/s LVOT Vmean:        62.000 cm/s LVOT VTI:          0.229 m LVOT/AV VTI ratio: 0.86 MITRAL VALVE               TRICUSPID VALVE MV Area (PHT): 3.66 cm    TR Peak grad:   33.4 mmHg MV Decel Time: 207 msec    TR Vmax:        289.00 cm/s MV E velocity: 87.40 cm/s MV A velocity: 83.60 cm/s  SHUNTS MV E/A ratio:  1.05        Systemic VTI: 0.23 m Carolan Clines Electronically signed by Carolan Clines Signature Date/Time: 06/21/2021/12:59:31 PM    Final    DG HIP OPERATIVE UNILAT WITH PELVIS LEFT  Result Date: 06/05/2021 CLINICAL DATA:  Left hip labral repair and CAM debridement. EXAM: OPERATIVE LEFT HIP (WITH PELVIS IF PERFORMED) TECHNIQUE: Fluoroscopic spot image(s) were submitted for interpretation post-operatively. FLUOROSCOPY TIME:  3 minutes, 20 seconds. COMPARISON:  Left hip x-rays dated February 02, 2021. FINDINGS: Multiple intraoperative fluoroscopic images of the left hip demonstrate several, at least two, arthroscopy portals with surgical instruments in the joint space. Two of the images demonstrate widening of the left hip joint space presumably due to traction. No acute osseous abnormality. IMPRESSION: Intraoperative fluoroscopic guidance for left hip arthroscopy. Electronically Signed   By: Obie Dredge M.D.   On: 06/05/2021 18:02   VAS [place] LOWER EXTREMITY VENOUS (DVT) (ONLY [facility] & [facility])  Result Date: 06/21/2021  Lower Venous DVT Study Patient Name:  ROSHINI FULWIDER  Date of Exam:   06/20/2021 Medical Rec #: [id number]         Accession #:    [id number] Date of Birth: 1976-01-03         Patient Gender: F Patient Age:   74 years Exam Location:  [facility] Procedure:      VAS [place] LOWER EXTREMITY VENOUS  (DVT) Referring Phys: Claudette Stapler --------------------------------------------------------------------------------  Indications: Pulmonary embolism, SOB, and bilateral calf pain s/p hip surgery.  Risk Factors: Surgery 06-05-2021 Left hip arthrosopy. Anticoagulation: Started on heparin 06-20-2021. Comparison Study: 06-20-2021 CTA of the chest performed at [facility]:                   Bilateral pulmonary embolism  is present. The most proximal                   clot is near the origin of the lower arteries to the right                   upper lobe and right lower lobe. Clot extends into some of the                   segmental or subsegmental branch vessels, overall clot burden                   moderate. The left upper lobe and the right middle lobe appear                   relatively spared. Main pulmonary artery is normal in                   diameter. Performing Technologist: Jean Rosenthal RDMS, RVT  Examination Guidelines: A complete evaluation includes B-mode imaging, spectral Doppler, color Doppler, and power Doppler as needed of all accessible portions of each vessel. Bilateral testing is considered an integral part of a complete examination. Limited examinations for reoccurring indications may be performed as noted. The reflux portion of the exam is performed with the patient in reverse Trendelenburg.  +---------+---------------+---------+-----------+----------+--------------+ RIGHT    CompressibilityPhasicitySpontaneityPropertiesThrombus Aging +---------+---------------+---------+-----------+----------+--------------+ CFV      Full           Yes      Yes                                 +---------+---------------+---------+-----------+----------+--------------+ SFJ      Full                                                        +---------+---------------+---------+-----------+----------+--------------+ FV Prox  Full                                                         +---------+---------------+---------+-----------+----------+--------------+ FV Mid   Full                                                        +---------+---------------+---------+-----------+----------+--------------+ FV DistalFull                                                        +---------+---------------+---------+-----------+----------+--------------+ PFV      Full                                                        +---------+---------------+---------+-----------+----------+--------------+  POP      Full           Yes      Yes                                 +---------+---------------+---------+-----------+----------+--------------+ PTV      None           No       No                   Acute          +---------+---------------+---------+-----------+----------+--------------+ PERO     Full                                                        +---------+---------------+---------+-----------+----------+--------------+ Gastroc  Full                                                        +---------+---------------+---------+-----------+----------+--------------+   +---------+---------------+---------+-----------+----------+--------------+ LEFT     CompressibilityPhasicitySpontaneityPropertiesThrombus Aging +---------+---------------+---------+-----------+----------+--------------+ CFV      Full           Yes      Yes                                 +---------+---------------+---------+-----------+----------+--------------+ SFJ      Full                                                        +---------+---------------+---------+-----------+----------+--------------+ FV Prox  Full                                                        +---------+---------------+---------+-----------+----------+--------------+ FV Mid   Full                                                         +---------+---------------+---------+-----------+----------+--------------+ FV DistalFull                                                        +---------+---------------+---------+-----------+----------+--------------+ PFV      Full                                                        +---------+---------------+---------+-----------+----------+--------------+  POP      Full           Yes      Yes                                 +---------+---------------+---------+-----------+----------+--------------+ PTV      None           No       No                   Acute          +---------+---------------+---------+-----------+----------+--------------+ PERO     Full                                                        +---------+---------------+---------+-----------+----------+--------------+ Gastroc  Full                                                        +---------+---------------+---------+-----------+----------+--------------+     Summary: RIGHT: - Findings consistent with acute deep vein thrombosis involving the right posterior tibial veins.  LEFT: - Findings consistent with acute deep vein thrombosis involving the left posterior tibial veins.  *See table(s) above for measurements and observations. Electronically signed by Waverly Ferrari MD on 06/21/2021 at 6:48:43 AM.    Final     Labs: BNP (last 3 results) No results for input(s): BNP in the last 8760 hours. Basic Metabolic Panel: Recent Labs  Lab 06/20/21 1509 06/21/21 0759 06/22/21 0257  NA 137 136 139  K 4.1 3.8 4.0  CL 105 106 109  CO2 23 21* 24  GLUCOSE 88 92 101*  BUN 8 7 7   CREATININE 0.61 0.57 0.63  CALCIUM 8.6* 8.4* 8.4*  MG  --  2.1  --   PHOS  --  2.4*  --    Liver Function Tests: Recent Labs  Lab 06/21/21 0759  AST 24  ALT 23  ALKPHOS 52  BILITOT 0.6  PROT 6.8  ALBUMIN 3.3*   No results for input(s): LIPASE, AMYLASE in the last 168 hours. No results for input(s):  AMMONIA in the last 168 hours. CBC: Recent Labs  Lab 06/20/21 1509 06/21/21 0113 06/21/21 0759 06/22/21 0257 06/23/21 0312  WBC 12.7* 11.9* 9.5 10.8* 10.1  NEUTROABS 6.4  --  4.3  --   --   HGB 12.6 12.4 12.8 12.1 12.9  HCT 37.5 37.9 38.1 36.5 39.4  MCV 88.9 90.2 88.6 89.0 88.7  PLT 282 268 294 273 293   Cardiac Enzymes: No results for input(s): CKTOTAL, CKMB, CKMBINDEX, TROPONINI in the last 168 hours. BNP: Invalid input(s): POCBNP CBG: No results for input(s): GLUCAP in the last 168 hours. D-Dimer No results for input(s): DDIMER in the last 72 hours. Hgb A1c No results for input(s): HGBA1C in the last 72 hours. Lipid Profile No results for input(s): CHOL, HDL, LDLCALC, TRIG, CHOLHDL, LDLDIRECT in the last 72 hours. Thyroid function studies Recent Labs    06/21/21 0759  TSH 2.305   Anemia work up No results for input(s): VITAMINB12, FOLATE, FERRITIN, TIBC, IRON, RETICCTPCT in the last  72 hours. Urinalysis    Component Value Date/Time   COLORURINE YELLOW 06/21/2021 0215   APPEARANCEUR CLEAR 06/21/2021 0215   LABSPEC 1.008 06/21/2021 0215   PHURINE 6.0 06/21/2021 0215   GLUCOSEU NEGATIVE 06/21/2021 0215   HGBUR MODERATE (A) 06/21/2021 0215   BILIRUBINUR NEGATIVE 06/21/2021 0215   KETONESUR NEGATIVE 06/21/2021 0215   PROTEINUR NEGATIVE 06/21/2021 0215   NITRITE NEGATIVE 06/21/2021 0215   LEUKOCYTESUR NEGATIVE 06/21/2021 0215   Sepsis Labs Invalid input(s): PROCALCITONIN,  WBC,  LACTICIDVEN Microbiology Recent Results (from the past 240 hour(s))  Resp Panel by RT-PCR (Flu A&B, Covid) Nasopharyngeal Swab     Status: None   Collection Time: 06/20/21  3:11 PM   Specimen: Nasopharyngeal Swab; Nasopharyngeal(NP) swabs in vial transport medium  Result Value Ref Range Status   SARS Coronavirus 2 by RT PCR NEGATIVE NEGATIVE Final    Comment: (NOTE) SARS-CoV-2 target nucleic acids are NOT DETECTED.  The SARS-CoV-2 RNA is generally detectable in upper  respiratory specimens during the acute phase of infection. The lowest concentration of SARS-CoV-2 viral copies this assay can detect is 138 copies/mL. A negative result does not preclude SARS-Cov-2 infection and should not be used as the sole basis for treatment or other patient management decisions. A negative result may occur with  improper specimen collection/handling, submission of specimen other than nasopharyngeal swab, presence of viral mutation(s) within the areas targeted by this assay, and inadequate number of viral copies(<138 copies/mL). A negative result must be combined with clinical observations, patient history, and epidemiological information. The expected result is Negative.  Fact Sheet for Patients:  BloggerCourse.com  Fact Sheet for Healthcare Providers:  SeriousBroker.it  This test is no t yet approved or cleared by the [place] FDA and  has been authorized for detection and/or diagnosis of SARS-CoV-2 by FDA under an Emergency Use Authorization (EUA). This EUA will remain  in effect (meaning this test can be used) for the duration of the COVID-19 declaration under Section 564(b)(1) of the Act, 21 U.S.C.section 360bbb-3(b)(1), unless the authorization is terminated  or revoked sooner.       Influenza A by PCR NEGATIVE NEGATIVE Final   Influenza B by PCR NEGATIVE NEGATIVE Final    Comment: (NOTE) The Xpert Xpress SARS-CoV-2/FLU/RSV plus assay is intended as an aid in the diagnosis of influenza from Nasopharyngeal swab specimens and should not be used as a sole basis for treatment. Nasal washings and aspirates are unacceptable for Xpert Xpress SARS-CoV-2/FLU/RSV testing.  Fact Sheet for Patients: BloggerCourse.com  Fact Sheet for Healthcare Providers: SeriousBroker.it  This test is not yet approved or cleared by the [place] FDA and has been  authorized for detection and/or diagnosis of SARS-CoV-2 by FDA under an Emergency Use Authorization (EUA). This EUA will remain in effect (meaning this test can be used) for the duration of the COVID-19 declaration under Section 564(b)(1) of the Act, 21 U.S.C. section 360bbb-3(b)(1), unless the authorization is terminated or revoked.  Performed at [facility], [address], [place], [place] [address]      Time coordinating discharge: 25 minutes  SIGNED: Lanae Boast, MD  [facility] 06/23/2021, 9:19 AM  If 7PM-7AM, please contact night-coverage [email or web address]

## 2021-06-23 NOTE — Plan of Care (Signed)

## 2021-06-23 NOTE — Plan of Care (Signed)

## 2021-06-27 ENCOUNTER — Ambulatory Visit (HOSPITAL_BASED_OUTPATIENT_CLINIC_OR_DEPARTMENT_OTHER): Payer: BC Managed Care – PPO | Admitting: Physical Therapy

## 2021-06-30 ENCOUNTER — Encounter (HOSPITAL_BASED_OUTPATIENT_CLINIC_OR_DEPARTMENT_OTHER): Payer: BC Managed Care – PPO | Admitting: Physical Therapy

## 2021-07-04 ENCOUNTER — Ambulatory Visit (HOSPITAL_BASED_OUTPATIENT_CLINIC_OR_DEPARTMENT_OTHER): Payer: BC Managed Care – PPO | Attending: Orthopaedic Surgery | Admitting: Physical Therapy

## 2021-07-04 ENCOUNTER — Encounter (HOSPITAL_BASED_OUTPATIENT_CLINIC_OR_DEPARTMENT_OTHER): Payer: Self-pay | Admitting: Physical Therapy

## 2021-07-04 ENCOUNTER — Other Ambulatory Visit: Payer: Self-pay

## 2021-07-04 DIAGNOSIS — M542 Cervicalgia: Secondary | ICD-10-CM | POA: Diagnosis present

## 2021-07-04 DIAGNOSIS — R262 Difficulty in walking, not elsewhere classified: Secondary | ICD-10-CM | POA: Diagnosis present

## 2021-07-04 DIAGNOSIS — M25552 Pain in left hip: Secondary | ICD-10-CM | POA: Diagnosis present

## 2021-07-04 DIAGNOSIS — M6281 Muscle weakness (generalized): Secondary | ICD-10-CM | POA: Insufficient documentation

## 2021-07-04 DIAGNOSIS — M25561 Pain in right knee: Secondary | ICD-10-CM | POA: Insufficient documentation

## 2021-07-04 NOTE — Therapy (Signed)
OUTPATIENT PHYSICAL THERAPY PROGRESS NOTE   Patient Name: Linda Gill MRN: [id number] DOB:July 01, 1976, 45 y.o., female Today's Date: 07/04/2021  PCP: Tracey Harries, MD REFERRING PROVIDER: Merton Border, MD   PT End of Session - 07/04/21 1016     Visit Number 10    Number of Visits 21    Date for PT Re-Evaluation 09/06/21    Authorization Type BCBS    PT Start Time 1025   pt arrives late, traffic   PT Stop Time 1100    PT Time Calculation (min) 35 min    Equipment Utilized During Treatment Other (comment)   crutches and brace   Activity Tolerance Patient tolerated treatment well;Patient limited by pain    Behavior During Therapy [facility] for tasks assessed/performed                  Past Medical History:  Diagnosis Date   Arthritis    RA   Depression    Past Surgical History:  Procedure Laterality Date   BREAST MASS EXCISION     HIP ARTHROSCOPY Left 06/05/2021   Procedure: left hip arthroscopy with labral repair and cam debridement;  Surgeon: [name], MD;  Location: [facility] OR;  Service: Orthopedics;  Laterality: Left;   Patient Active Problem List   Diagnosis Date Noted   Pulmonary embolism (HCC) 06/20/2021   DVT (deep venous thrombosis) (HCC) 06/20/2021     ONSET DATE: 06/05/2021 L HIP ARTHROSCOPY WITH LABRAL REPAIR, L CAM DEBRIDEMENT, L PINCER DEBRIDEMENT   REFERRING DIAG: M25.552 (ICD-10-CM) - Left hip pain M17.11 (ICD-10-CM) - Patellofemoral arthritis of right knee   10/17 Surgery date   THERAPY DIAG:  Pain in joint of right knee   Pain in left hip   Muscle weakness (generalized)   Difficulty walking   SUBJECTIVE:    SUBJECTIVE STATEMENT:   Pt states she does not feel good today bc of recent hospitalization for a week due to DVT. Pt reports hospital MD noticed erythema into the L ankle. She notices L quad not firing when climbing stairs. She reports no R knee.  She states her RA is now very flared up. She is now on blood thinners and  stopped Meloxicam per MD orders. Pt complains of HA since being in the hospital.    PERTINENT HISTORY: RA, L labral tear   PAIN:  Are you having pain? no VAS scale: 0/10 L hip Pain location: L hip incision site Pain orientation: Right and Left  PAIN TYPE: throbbing, aching Pain description: constant  Aggravating factors: postural Relieving factors: resting     THERAPY DIAG:  Pain in joint of right knee  Pain in left hip  Muscle weakness (generalized)  Difficulty walking  PERTINENT HISTORY: PERTINENT HISTORY: RA, L labral tear   PRECAUTIONS: None    OBJECTIVE: Surgeon and ATC presents to visit today to discuss recent pt hospitalization and protocol. Physician edu given for HA management and progression of PT protocol.   LEFS 38/80 points (or 47.50%)  Vitals- supine   BP 138/96  HR 75  L HIP PROM- up to 90 deg flexion, 30 deg IR and ER, hip extension to 0 in supine (-) Homan's    Pt able to ambulate without crutches at this time. Antalgic gait pattern.   L quad tenderness and hypertonicity into rec fem and VL  L HIP PROM- up to 90 deg flexion, 30 deg IR and ER, hip extension to 0 in supine (per protocol)   TREATMENT:  Pt received PROM of L hip to within surgical precautions as listed above.   TODAY 11/15  PROM with protocol  Review of HEP for home LAQ with 5lbs weight Clamshell with RTB 2x10   Previous:   Exercises Upright bike- 80 deg hip flexion L1 8 min  Glute set 5s 10x S/L clamshell 20x on L Supine march with TrA brace 3s 10x ( VC for not breaking 90, foot clearing table only, near full knee flexion position only)  Prone knee flexion 5lbs 3x10 LAQ 2x10 5lbs  bilat Prone quad stretch, pillow under hips 30s 2x   PROM to limits listed above      PATIENT EDUCATION:  Education details: surgical precautions/ protocol, anatomy, exercise progression, DOMS expectations, recovery times, post-surgical progression Person educated:  Patient Education method: Explanation, Demonstration, Tactile cues, Verbal cues, and Handouts Education comprehension: verbalized understanding, returned demonstration, verbal cues required, and tactile cues required     HOME EXERCISE PROGRAM:   Access Code: [email or web address] URL: [email or web address] Date: 06/19/2021 Prepared by: Daleen Bo       ASSESSMENT:   CLINICAL IMPRESSION:  Pt is 4 weeks out at this point. Due to recent hospitalization due to PE and DVT, pt has general L LE deconditioning as well as globalized/pain discomfort- likely due to lack of mobility and flare of RA. Pt does not present with increased joint stiffness with clinician PROM but does report increased L quad weakness. Pt able to fire quad with sustained contraction. MMT not tested due to surgical precautions. Plan to continue with protocol at next session. MD and ATC present during treatment session today to give edu and discuss POC. Pt would benefit from continued skilled therapy in order to reach goals and maximize functional bilat LE  strength and ROM for full return to PLOF.    REHAB POTENTIAL: Good   CLINICAL DECISION MAKING: Stable/uncomplicated   EVALUATION COMPLEXITY: Low     GOALS:     SHORT TERM GOALS:   STG Name Target Date Goal status  1 Pt will become independent with HEP in order to demonstrate synthesis of PT education.   07/18/2021  MET  2 Pt will be able to demonstrate ability to perform full AROM of L LE without assistance LE function for self-care and house hold duties.   08/15/2021  ongoing  3 Pt will be able to demonstrate ability to perform stairs and gait with no AD/UE and equal WB in order to demonstrate functional improvement in L LE function for self-care and house hold duties.     07/20/2021 ongoing  4 Pt will have an at least 9 pt improvement in LEFS measure in order to demonstrate MCID improvement in daily function.   07/20/2021 MET    LONG TERM GOALS:    LTG  Name Target Date Goal status  1 Pt  will become independent with final HEP in order to demonstrate synthesis of PT education.   09/26/2021  ongoing  2 Pt will be able to demonstrate full floor to stand, kneeling to stand, and stand to kneeling transfer without UE support in order to demonstrate functional improvement in L LE function for ADL/house hold duties. 08/31/2021 ongoing  3 Pt will be able to demonstrate DL squat with >/=15 lbs in order to demonstrate functional improvement in bilat LE strength for return to PLOF and exercise.   08/31/2021 ongoing  4 Pt will have an at least 18 pt improvement in LEFS measure in order to demonstrate MCID improvement in daily  function.     08/31/2021 ongoing    PLAN: PT FREQUENCY: 1-2x/week   PT DURATION: 12 weeks   PLANNED INTERVENTIONS: Therapeutic exercises, Therapeutic activity, Neuro Muscular re-education, Balance training, Gait training, Patient/Family education, Joint mobilization, Stair training, Orthotic/Fit training, Aquatic Therapy, Dry Needling, Electrical stimulation, Spinal mobilization, Cryotherapy, Moist heat, scar mobilization, Taping, Vasopneumatic device, Ultrasound, Ionotophoresis 4mg /ml Dexamethasone, and Manual therapy   PLAN FOR NEXT SESSION: review of previous exercise, progress quad strength and glute strength (per MD instruction)   Daleen Bo PT, DPT 07/04/21 12:42 PM   Patient name: Linda Gill MRN: [id number] DOB: Feb 15, 1976

## 2021-07-05 ENCOUNTER — Other Ambulatory Visit (HOSPITAL_COMMUNITY): Payer: Self-pay

## 2021-07-05 ENCOUNTER — Telehealth (HOSPITAL_COMMUNITY): Payer: Self-pay

## 2021-07-05 NOTE — Telephone Encounter (Signed)
Pharmacy Transitions of Care Follow-up Telephone Call  Date of discharge: 06/23/21  Discharge Diagnosis: DVT/PE  How have you been since you were released from the hospital?  Patient has not been feeling well over last few days. She usually has a headache when she wakes up and when she lies down she can feel the headache start again. Surgeon and PA are aware of this since BP was taken laying down at last appointment and BP was high. Encouraged patient to call PCP since next appointment is on 07/18/21 so MD is aware in case they want to try to move her appointment up. No questions about meds at this time.  Medication changes made at discharge:     START taking: Eliquis DVT/PE Starter Pack (Apixaban Starter Pack (10mg  and 5mg ))  apixaban (ELIQUIS)  STOP taking: aspirin EC 325 MG tablet  meloxicam 15 MG tablet (MOBIC)  norethindrone-ethinyl estradiol-FE 1-20 MG-MCG tablet (LOESTRIN FE)   Medication changes verified by the patient? Yes    Medication Accessibility:  Home Pharmacy:  [facility] [address]  Was the patient provided with refills on discharged medications? Yes   Have all prescriptions been transferred from [facility] to home pharmacy?  Yes  Is the patient able to afford medications? Has insurance    Medication Review:  APIXABAN (ELIQUIS)  Apixaban 10 mg BID initiated on 06/23/21. Will switch to apixaban 5 mg BID after 7 days (DATE 06/30/21).  - Discussed importance of taking medication around the same time everyday  - Advised patient of medications to avoid (NSAIDs, ASA)  - Educated that Tylenol (acetaminophen) will be the preferred analgesic to prevent risk of bleeding  - Emphasized importance of monitoring for signs and symptoms of bleeding (abnormal bruising, prolonged bleeding, nose bleeds, bleeding from gums, discolored urine, black tarry stools)  - Advised patient to alert all providers of anticoagulation therapy prior to starting a new medication or having a procedure     Follow-up Appointments:  PCP Hospital f/u appt confirmed? Scheduled to see Dr. 13/4/22 on 07/18/21 @ 1:00pm.   Specialist Hospital f/u appt confirmed? Scheduled to see Dr. Everlene Other on 07/17/21 @ 8:30am.   If their condition worsens, is the pt aware to call PCP or go to the Emergency Dept.? Yes  Final Patient Assessment: Patient has follow up scheduled and refills at home pharmacy

## 2021-07-07 ENCOUNTER — Encounter (HOSPITAL_BASED_OUTPATIENT_CLINIC_OR_DEPARTMENT_OTHER): Payer: Self-pay | Admitting: Physical Therapy

## 2021-07-07 ENCOUNTER — Other Ambulatory Visit: Payer: Self-pay

## 2021-07-07 ENCOUNTER — Ambulatory Visit (HOSPITAL_BASED_OUTPATIENT_CLINIC_OR_DEPARTMENT_OTHER): Payer: BC Managed Care – PPO | Admitting: Physical Therapy

## 2021-07-07 DIAGNOSIS — M25561 Pain in right knee: Secondary | ICD-10-CM

## 2021-07-07 DIAGNOSIS — M25552 Pain in left hip: Secondary | ICD-10-CM

## 2021-07-07 DIAGNOSIS — R262 Difficulty in walking, not elsewhere classified: Secondary | ICD-10-CM

## 2021-07-07 DIAGNOSIS — M6281 Muscle weakness (generalized): Secondary | ICD-10-CM

## 2021-07-07 NOTE — Therapy (Signed)
OUTPATIENT PHYSICAL THERAPY PROGRESS NOTE   Patient Name: Linda Gill MRN: [id number] DOB:Feb 12, 1976, 45 y.o., female Today's Date: 07/07/2021  PCP: Bernerd Limbo, MD REFERRING PROVIDER: Crosby Oyster, MD   PT End of Session - 07/07/21 [phone]     Visit Number 11    Number of Visits 21    Date for PT Re-Evaluation 09/06/21    Authorization Type BCBS    PT Start Time [phone]    PT Stop Time 1015    PT Time Calculation (min) 38 min    Equipment Utilized During Treatment Other (comment)   crutches and brace   Activity Tolerance Patient tolerated treatment well;Patient limited by pain    Behavior During Therapy [facility] for tasks assessed/performed                   Past Medical History:  Diagnosis Date   Arthritis    RA   Depression    Past Surgical History:  Procedure Laterality Date   BREAST MASS EXCISION     HIP ARTHROSCOPY Left 06/05/2021   Procedure: left hip arthroscopy with labral repair and cam debridement;  Surgeon: Vanetta Mulders, MD;  Location: [place];  Service: Orthopedics;  Laterality: Left;   Patient Active Problem List   Diagnosis Date Noted   Pulmonary embolism ([place]) 06/20/2021   DVT (deep venous thrombosis) ([place]) 06/20/2021     ONSET DATE: 06/05/2021 L HIP ARTHROSCOPY WITH LABRAL REPAIR, L CAM DEBRIDEMENT, L PINCER DEBRIDEMENT   REFERRING DIAG: M25.552 (ICD-10-CM) - Left hip pain M17.11 (ICD-10-CM) - Patellofemoral arthritis of right knee   10/17 Surgery date   THERAPY DIAG:  Pain in joint of right knee   Pain in left hip   Muscle weakness (generalized)   Difficulty walking   SUBJECTIVE:    SUBJECTIVE STATEMENT:   Pt states she feels better today. Her R knee is bothering her much more now. Her L hip feels a little sore    PERTINENT HISTORY: RA, L labral tear   PAIN:  Are you having pain? no VAS scale: 4/10 L hipl 6/10 R knee Pain orientation: Right and Left  PAIN TYPE: throbbing, aching Pain description: constant   Aggravating factors: postural Relieving factors: resting     THERAPY DIAG:  Pain in joint of right knee  Pain in left hip  Muscle weakness (generalized)  Difficulty walking  PERTINENT HISTORY: PERTINENT HISTORY: RA, L labral tear   PRECAUTIONS: None    OBJECTIVE:    L HIP PROM- up to 90 deg flexion, 30 deg IR and ER, hip extension to 0 in supine (per protocol)   TREATMENT:  11/18 Upright back bike 34 L5 8 min LAQ 5lbs 3x10 Clam 3lb ankle weight 2x10 Quadruped with UE reach 20x Tall kneeling hip hinge 20x (unable to do due to R knee) Staggered bridge L LE focus 3x10    TODAY 11/15  PROM with protocol  Review of HEP for home LAQ with 5lbs weight Clamshell with RTB 2x10       PATIENT EDUCATION:  Education details: surgical precautions/ protocol, anatomy, exercise progression, DOMS expectations, recovery times, post-surgical progression Person educated: Patient Education method: Explanation, Demonstration, Tactile cues, Verbal cues, and Handouts Education comprehension: verbalized understanding, returned demonstration, verbal cues required, and tactile cues required     HOME EXERCISE PROGRAM:   Access Code: [email or web address] URL: [email or web address] Date: 06/19/2021 Prepared by: Daleen Bo       ASSESSMENT:   CLINICAL IMPRESSION:  Pt is 4 weeks.  Pt able continue with L LE strength and WB today without increased pain. Pt was able to perform resisted hip ABD but needed to regress to 3lbs in order to reduce pain and discomfort. Pt able to perform more hip hinging motions today with good tolerance. Plan to proceed with protocol. Pt to have MD visit today to discuss BP and HA issues. Pt would benefit from continued skilled therapy in order to reach goals and maximize functional bilat LE  strength and ROM for full return to PLOF.    REHAB POTENTIAL: Good   CLINICAL DECISION MAKING: Stable/uncomplicated   EVALUATION COMPLEXITY: Low     GOALS:      SHORT TERM GOALS:   STG Name Target Date Goal status  1 Pt will become independent with HEP in order to demonstrate synthesis of PT education.   07/21/2021  MET  2 Pt will be able to demonstrate ability to perform full AROM of L LE without assistance LE function for self-care and house hold duties.   08/18/2021  ongoing  3 Pt will be able to demonstrate ability to perform stairs and gait with no AD/UE and equal WB in order to demonstrate functional improvement in L LE function for self-care and house hold duties.     07/20/2021 ongoing  4 Pt will have an at least 9 pt improvement in LEFS measure in order to demonstrate MCID improvement in daily function.   07/20/2021 MET    LONG TERM GOALS:    LTG Name Target Date Goal status  1 Pt  will become independent with final HEP in order to demonstrate synthesis of PT education.   09/29/2021  ongoing  2 Pt will be able to demonstrate full floor to stand, kneeling to stand, and stand to kneeling transfer without UE support in order to demonstrate functional improvement in L LE function for ADL/house hold duties. 08/31/2021 ongoing  3 Pt will be able to demonstrate DL squat with >/=15 lbs in order to demonstrate functional improvement in bilat LE strength for return to PLOF and exercise.   08/31/2021 ongoing  4 Pt will have an at least 18 pt improvement in LEFS measure in order to demonstrate MCID improvement in daily function.     08/31/2021 ongoing    PLAN: PT FREQUENCY: 1-2x/week   PT DURATION: 12 weeks   PLANNED INTERVENTIONS: Therapeutic exercises, Therapeutic activity, Neuro Muscular re-education, Balance training, Gait training, Patient/Family education, Joint mobilization, Stair training, Orthotic/Fit training, Aquatic Therapy, Dry Needling, Electrical stimulation, Spinal mobilization, Cryotherapy, Moist heat, scar mobilization, Taping, Vasopneumatic device, Ultrasound, Ionotophoresis 25m/ml Dexamethasone, and Manual therapy   PLAN FOR  NEXT SESSION: review of previous exercise, progress quad strength and glute strength (per MD instruction)   ADaleen BoPT, DPT 07/07/21 10:16 AM   Patient name: Linda BENETTMRN: [id number]: 910-26-1977

## 2021-07-10 ENCOUNTER — Other Ambulatory Visit: Payer: Self-pay

## 2021-07-10 ENCOUNTER — Encounter (HOSPITAL_BASED_OUTPATIENT_CLINIC_OR_DEPARTMENT_OTHER): Payer: Self-pay | Admitting: Physical Therapy

## 2021-07-10 ENCOUNTER — Ambulatory Visit (HOSPITAL_BASED_OUTPATIENT_CLINIC_OR_DEPARTMENT_OTHER): Payer: BC Managed Care – PPO | Admitting: Physical Therapy

## 2021-07-10 DIAGNOSIS — M25552 Pain in left hip: Secondary | ICD-10-CM

## 2021-07-10 DIAGNOSIS — M25561 Pain in right knee: Secondary | ICD-10-CM | POA: Diagnosis not present

## 2021-07-10 DIAGNOSIS — R262 Difficulty in walking, not elsewhere classified: Secondary | ICD-10-CM

## 2021-07-10 DIAGNOSIS — M6281 Muscle weakness (generalized): Secondary | ICD-10-CM

## 2021-07-10 NOTE — Therapy (Signed)
OUTPATIENT PHYSICAL THERAPY PROGRESS NOTE   Patient Name: Linda Gill MRN: [id number] DOB:08/01/1976, 45 y.o., female Today's Date: 07/10/2021  PCP: Bernerd Limbo, MD REFERRING PROVIDER: Crosby Oyster, MD   PT End of Session - 07/10/21 1341     Visit Number 12    Number of Visits 21    Date for PT Re-Evaluation 09/06/21    Authorization Type BCBS    PT Start Time 1345    PT Stop Time 1425    PT Time Calculation (min) 40 min    Equipment Utilized During Treatment Other (comment)   crutches and brace   Activity Tolerance Patient tolerated treatment well;Patient limited by pain    Behavior During Therapy [facility] for tasks assessed/performed                   Past Medical History:  Diagnosis Date   Arthritis    RA   Depression    Past Surgical History:  Procedure Laterality Date   BREAST MASS EXCISION     HIP ARTHROSCOPY Left 06/05/2021   Procedure: left hip arthroscopy with labral repair and cam debridement;  Surgeon: Vanetta Mulders, MD;  Location: [place];  Service: Orthopedics;  Laterality: Left;   Patient Active Problem List   Diagnosis Date Noted   Pulmonary embolism ([place]) 06/20/2021   DVT (deep venous thrombosis) ([place]) 06/20/2021     ONSET DATE: 06/05/2021 L HIP ARTHROSCOPY WITH LABRAL REPAIR, L CAM DEBRIDEMENT, L PINCER DEBRIDEMENT   REFERRING DIAG: M25.552 (ICD-10-CM) - Left hip pain M17.11 (ICD-10-CM) - Patellofemoral arthritis of right knee   10/17 Surgery date   THERAPY DIAG:  Pain in joint of right knee   Pain in left hip   Muscle weakness (generalized)   Difficulty walking   SUBJECTIVE:    SUBJECTIVE STATEMENT:   Pt states her RA is very bad today. Pt state she still wakes up with headaches. Had CT scan that came back negative. She was not sore after last session. Quad was just fatigued by the end of the day. Pt is able to perform reciprocal stepping in the afternoon.   PERTINENT HISTORY: RA, L labral tear   PAIN:  Are you  having pain? Yes VAS scale: 0/10 L hip, 6/10 R knee Pain orientation: Right and Left  PAIN TYPE: throbbing, aching Pain description: constant  Aggravating factors: postural Relieving factors: resting     THERAPY DIAG:  Pain in joint of right knee  Pain in left hip  Muscle weakness (generalized)  Difficulty walking  PERTINENT HISTORY: PERTINENT HISTORY: RA, L labral tear   PRECAUTIONS: None    OBJECTIVE: BP seated L arm  150/97 93 HR   Supine BP 136/86  TREATMENT:   11/21 Upright back bike 34 L5 8 min LAQ 5lbs 3x10 STS to thigh height chair 2x15 Sidestepping yellow TB 78ft x3 Staggered bridge L LE focus 3x10 RDL 5lbs 2x10  11/18 Upright back bike 34 L5 8 min LAQ 5lbs 3x10 Clam 3lb ankle weight 2x10 Quadruped with UE reach 20x Tall kneeling hip hinge 20x (unable to do due to R knee) Staggered bridge L LE focus 3x10    11/15  PROM with protocol  Review of HEP for home LAQ with 5lbs weight Clamshell with RTB 2x10       PATIENT EDUCATION:  Education details: surgical precautions/ protocol, anatomy, exercise progression, DOMS expectations, recovery times, post-surgical progression Person educated: Patient Education method: Explanation, Demonstration, Tactile cues, Verbal cues, and Handouts Education comprehension:  verbalized understanding, returned demonstration, verbal cues required, and tactile cues required     HOME EXERCISE PROGRAM:   Access Code: [email or web address] URL: [email or web address] Date: 06/19/2021 Prepared by: Daleen Bo       ASSESSMENT:   CLINICAL IMPRESSION: Pt with elevated BP during session but with safe limits. Pt with generally increased pain today due to RA as well as complaint of HA. From the hip perspective, pt is doing well and progressing with ROM but cardiopulmonary issues and RA are limiting exercise. Pt to contact surgeon to discuss further options for HA. Pt had recent CT scan that came back negative. Pt  would benefit from continued skilled therapy in order to reach goals and maximize functional bilat LE  strength and ROM for full return to PLOF.    REHAB POTENTIAL: Good   CLINICAL DECISION MAKING: Stable/uncomplicated   EVALUATION COMPLEXITY: Low     GOALS:     SHORT TERM GOALS:   STG Name Target Date Goal status  1 Pt will become independent with HEP in order to demonstrate synthesis of PT education.   07/24/2021  MET  2 Pt will be able to demonstrate ability to perform full AROM of L LE without assistance LE function for self-care and house hold duties.   08/21/2021  ongoing  3 Pt will be able to demonstrate ability to perform stairs and gait with no AD/UE and equal WB in order to demonstrate functional improvement in L LE function for self-care and house hold duties.     07/20/2021 ongoing  4 Pt will have an at least 9 pt improvement in LEFS measure in order to demonstrate MCID improvement in daily function.   07/20/2021 MET    LONG TERM GOALS:    LTG Name Target Date Goal status  1 Pt  will become independent with final HEP in order to demonstrate synthesis of PT education.   10/02/2021  ongoing  2 Pt will be able to demonstrate full floor to stand, kneeling to stand, and stand to kneeling transfer without UE support in order to demonstrate functional improvement in L LE function for ADL/house hold duties. 08/31/2021 ongoing  3 Pt will be able to demonstrate DL squat with >/=15 lbs in order to demonstrate functional improvement in bilat LE strength for return to PLOF and exercise.   08/31/2021 ongoing  4 Pt will have an at least 18 pt improvement in LEFS measure in order to demonstrate MCID improvement in daily function.     08/31/2021 ongoing    PLAN: PT FREQUENCY: 1-2x/week   PT DURATION: 12 weeks   PLANNED INTERVENTIONS: Therapeutic exercises, Therapeutic activity, Neuro Muscular re-education, Balance training, Gait training, Patient/Family education, Joint mobilization,  Stair training, Orthotic/Fit training, Aquatic Therapy, Dry Needling, Electrical stimulation, Spinal mobilization, Cryotherapy, Moist heat, scar mobilization, Taping, Vasopneumatic device, Ultrasound, Ionotophoresis 4mg /ml Dexamethasone, and Manual therapy   PLAN FOR NEXT SESSION: review of previous exercise, progress quad strength and glute strength (per MD instruction)   Daleen Bo PT, DPT 07/10/21 2:26 PM   Patient name: Linda Gill MRN: [id number] DOB: [birth year]

## 2021-07-11 ENCOUNTER — Other Ambulatory Visit (HOSPITAL_BASED_OUTPATIENT_CLINIC_OR_DEPARTMENT_OTHER): Payer: Self-pay | Admitting: Orthopaedic Surgery

## 2021-07-11 DIAGNOSIS — G4486 Cervicogenic headache: Secondary | ICD-10-CM

## 2021-07-12 ENCOUNTER — Ambulatory Visit (HOSPITAL_BASED_OUTPATIENT_CLINIC_OR_DEPARTMENT_OTHER): Payer: BC Managed Care – PPO | Admitting: Physical Therapy

## 2021-07-12 ENCOUNTER — Telehealth (HOSPITAL_BASED_OUTPATIENT_CLINIC_OR_DEPARTMENT_OTHER): Payer: Self-pay | Admitting: Physical Therapy

## 2021-07-12 NOTE — Telephone Encounter (Signed)
LVM regarding NS for PT appointment today  Pete Schnitzer C. Leeland Lovelady PT, DPT 07/12/21 11:28 AM

## 2021-07-17 ENCOUNTER — Other Ambulatory Visit: Payer: Self-pay

## 2021-07-17 ENCOUNTER — Ambulatory Visit (INDEPENDENT_AMBULATORY_CARE_PROVIDER_SITE_OTHER): Payer: BC Managed Care – PPO | Admitting: Orthopaedic Surgery

## 2021-07-17 DIAGNOSIS — M25852 Other specified joint disorders, left hip: Secondary | ICD-10-CM

## 2021-07-17 NOTE — Progress Notes (Signed)
Post Operative Evaluation    Procedure/Date of Surgery: 06/05/21 -left hip arthroscopy with pincer and cam debridement  Interval History:   Linda Gill presents today 5 weeks status post left hip arthroscopy.  Overall she is doing very well.  She continues to work with physical therapy.  Her headaches have now essentially resolved.  She has had her blood pressure being treated with her primary care doctor.  She does continue to have soreness and with pain with laying on the side although she continues to make slow and steady improvements.Marland Kitchen   PMH/PSH/Family History/Social History/Meds/Allergies:    Past Medical History:  Diagnosis Date   Arthritis    RA   Depression    Past Surgical History:  Procedure Laterality Date   BREAST MASS EXCISION     HIP ARTHROSCOPY Left 06/05/2021   Procedure: left hip arthroscopy with labral repair and cam debridement;  Surgeon: [name], MD;  Location: [facility] OR;  Service: Orthopedics;  Laterality: Left;   Social History   Socioeconomic History   Marital status: Married    Spouse name: Not on file   Number of children: Not on file   Years of education: Not on file   Highest education level: Not on file  Occupational History   Not on file  Tobacco Use   Smoking status: Never   Smokeless tobacco: Never  Vaping Use   Vaping Use: Never used  Substance and Sexual Activity   Alcohol use: Not Currently   Drug use: Never   Sexual activity: Not on file  Other Topics Concern   Not on file  Social History Narrative   Not on file   Social Determinants of Health   Financial Resource Strain: Not on file  Food Insecurity: Not on file  Transportation Needs: Not on file  Physical Activity: Not on file  Stress: Not on file  Social Connections: Not on file   Family History  Problem Relation Age of Onset   CAD Father    Diabetes Father    Clotting disorder Neg Hx    Allergies  Allergen Reactions    Moxifloxacin Rash   Penicillins Rash    Tolerated Ancef 2g IV on 06/05/2021   Shellfish Allergy Rash    Not allergic to iodine   Sulfa Antibiotics Rash   Current Outpatient Medications  Medication Sig Dispense Refill   APIXABAN (ELIQUIS) VTE STARTER PACK (10MG  AND 5MG ) Use as directed on package. 74 each 0   apixaban (ELIQUIS) 5 MG TABS tablet Take 1 tablet (5 mg total) by mouth 2 (two) times daily. 60 tablet 0   Ascorbic Acid (VITAMIN C) 1000 MG tablet Take 1,000 mg by mouth daily.     Cholecalciferol (VITAMIN D3 PO) Take 7,000 Units by mouth daily.     FLUoxetine (PROZAC) 20 MG capsule Take 20 mg by mouth daily.     montelukast (SINGULAIR) 10 MG tablet Take 10 mg by mouth daily.     oxyCODONE (OXY IR/ROXICODONE) 5 MG immediate release tablet Take 1 tablet (5 mg total) by mouth every 4 (four) hours as needed for severe pain. (Patient not taking: Reported on 06/20/2021) 30 tablet 0   oxycodone (OXY-IR) 5 MG capsule Take 1 capsule (5 mg total) by mouth every 4 (four) hours as needed (severe pain). (Patient not taking: Reported  on 06/20/2021) 20 capsule 0   vitamin B-12 (CYANOCOBALAMIN) 1000 MCG tablet Take 2,000 mcg by mouth daily.     vitamin E 180 MG (400 UNITS) capsule Take 400 Units by mouth daily.     zinc gluconate 50 MG tablet Take 50 mg by mouth daily.     No current facility-administered medications for this visit.   No results found.  Review of Systems:   A ROS was performed including pertinent positives and negatives as documented in the HPI.   Musculoskeletal Exam:    There were no vitals taken for this visit.  Left hip range of motion 100 degrees flexion, some soreness in flexion and internal rotation about the abductor muscles.  There is moderate gluteus weakness with active abduction laying on the right side with the left side.  Imaging:    none  I personally reviewed and interpreted the radiographs.   Assessment:   45 year old female 5-weeks status post left  hip arthroscopy with cam and pincer debridement as well as labral repair, overall doing well.  She unfortunately did develop pulmonary emboli postoperatively which required treatment with Eliquis.  She is doing better from this perspective.  Postoperatively her blood pressure has been treated with medications which has helped with a headache that has evolved.  She no longer has any headaches.  I continue to like her to advance per protocol.  She is needing to go to [place] to do further assessment about whether she is wanting to move out there with her husband.  I would like to potentially persist back another month from now in order to allow her additional time Plan :    -Return to clinic in 3 weeks    I personally saw and evaluated the patient, and participated in the management and treatment plan.  [name], MD Attending Physician, Orthopedic Surgery  This document was dictated using Dragon voice recognition software. A reasonable attempt at proof reading has been made to minimize errors.

## 2021-07-18 ENCOUNTER — Encounter (HOSPITAL_BASED_OUTPATIENT_CLINIC_OR_DEPARTMENT_OTHER): Payer: Self-pay | Admitting: Physical Therapy

## 2021-07-18 ENCOUNTER — Ambulatory Visit (HOSPITAL_BASED_OUTPATIENT_CLINIC_OR_DEPARTMENT_OTHER): Payer: BC Managed Care – PPO | Admitting: Physical Therapy

## 2021-07-18 DIAGNOSIS — R262 Difficulty in walking, not elsewhere classified: Secondary | ICD-10-CM

## 2021-07-18 DIAGNOSIS — M25561 Pain in right knee: Secondary | ICD-10-CM

## 2021-07-18 DIAGNOSIS — M6281 Muscle weakness (generalized): Secondary | ICD-10-CM

## 2021-07-18 DIAGNOSIS — M542 Cervicalgia: Secondary | ICD-10-CM

## 2021-07-18 DIAGNOSIS — M25552 Pain in left hip: Secondary | ICD-10-CM

## 2021-07-18 NOTE — Therapy (Signed)
OUTPATIENT PHYSICAL THERAPY RE-EVALUATION NOTE   Patient Name: Linda Gill MRN: [id number] DOB:01-09-1976, 45 y.o., female Today's Date: 07/18/2021  PCP: Bernerd Limbo, MD REFERRING PROVIDER: Crosby Oyster, MD   PT End of Session - 07/18/21 [phone]     Visit Number 13    Number of Visits 21    Date for PT Re-Evaluation 09/06/21    Authorization Type BCBS    PT Start Time 0930    PT Stop Time 1015    PT Time Calculation (min) 45 min    Equipment Utilized During Treatment Other (comment)   crutches and brace   Activity Tolerance Patient tolerated treatment well;Patient limited by pain    Behavior During Therapy [facility] for tasks assessed/performed                    Past Medical History:  Diagnosis Date   Arthritis    RA   Depression    Past Surgical History:  Procedure Laterality Date   BREAST MASS EXCISION     HIP ARTHROSCOPY Left 06/05/2021   Procedure: left hip arthroscopy with labral repair and cam debridement;  Surgeon: Vanetta Mulders, MD;  Location: [facility];  Service: Orthopedics;  Laterality: Left;   Patient Active Problem List   Diagnosis Date Noted   Pulmonary embolism ([place]) 06/20/2021   DVT (deep venous thrombosis) ([place]) 06/20/2021     ONSET DATE: 06/05/2021 L HIP ARTHROSCOPY WITH LABRAL REPAIR, L CAM DEBRIDEMENT, L PINCER DEBRIDEMENT   REFERRING DIAG: M25.552 (ICD-10-CM) - Left hip pain M17.11 (ICD-10-CM) - Patellofemoral arthritis of right knee   10/17 Surgery date   THERAPY DIAG:  Pain in joint of right knee   Pain in left hip   Muscle weakness (generalized)   Difficulty walking   SUBJECTIVE:    SUBJECTIVE STATEMENT:   Pt states she no longer has HA after seeing MD. Pt states she is having hip extension pain. She states her neck is still painful. She is hyper aware of her posture during the work day but still has pain. Pt denies UE NT. Pt complains of pain and stiffness with end range C/S ROM.    PERTINENT HISTORY: RA, L labral  tear   PAIN:  Are you having pain? Yes VAS scale: 0/10 L hip, 6/10 R knee Pain orientation: Right and Left  PAIN TYPE: throbbing, aching Pain description: constant  Aggravating factors: postural Relieving factors: resting     THERAPY DIAG:  Pain in joint of right knee  Pain in left hip  Muscle weakness (generalized)  Difficulty walking  Cervicalgia  PERTINENT HISTORY: PERTINENT HISTORY: RA, L labral tear   PRECAUTIONS: None    OBJECTIVE:   CS ROM:  Flexion WFL- tightness and pain of R UT and subocc Ext- WFL, p! L rot-  80% with p! R rot- WFL p! L SB-  60% p! R SB- 65% p!  Palpation: hypertonicity of bilat C/S, paraspinals, UT, levator, suboccip; tenderness R>L  Joint mobility: R sided C2-6 UPA stiffness, CPA stiffness C2-6  Special Tests: Spurling (-) Distraction (+)  TREATMENT:   11/29  Upright back bike 34 L5 8 min  Subocc release C2-6 UPA and CPA grade III GTB bilat shoulder ER 2x10 GTB row 15x Self massage tennis ball Standing hip flexor stretch wide stance 30s 2x   11/21 Upright back bike 34 L5 8 min LAQ 5lbs 3x10 STS to thigh height chair 2x15 Sidestepping yellow TB 45f x3 Staggered bridge L LE focus 3x10  RDL 5lbs 2x10  11/18 Upright back bike 34 L5 8 min LAQ 5lbs 3x10 Clam 3lb ankle weight 2x10 Quadruped with UE reach 20x Tall kneeling hip hinge 20x (unable to do due to R knee) Staggered bridge L LE focus 3x10     PATIENT EDUCATION:  Education details: surgical precautions/ protocol, C/S anatomy, exam findings, exercise progression, DOMS expectations, recovery times, post-surgical progression Person educated: Patient Education method: Explanation, Demonstration, Tactile cues, Verbal cues, and Handouts Education comprehension: verbalized understanding, returned demonstration, verbal cues required, and tactile cues required     HOME EXERCISE PROGRAM:   Access Code: [email or web address] URL: [email or web address] Date:  06/19/2021 Prepared by: Daleen Bo       ASSESSMENT:   CLINICAL IMPRESSION: Pt re-evaled today for new C/S pain. Pt's s/s appear consistent with mechanical neck pain due to upper quarter soft tissue restriction/spasm. Pt with particular tightness into R sided UT, paraspinals, and rhomboids. Pt responded well to manual therapy, exercise, and self massage techniques. Pt was able to incorporate hip extension stretching to improve gait quality but does require more open pack position to reduce pain. Plan to add in C/S and UT DN at next session and continue with hip protocol. C/S HEP provided verbally. Pt should be able to perform hip ABD strength and CKC movements at that time. Pt would benefit from continued skilled therapy in order to reach goals and maximize functional bilat LE  strength and ROM for full return to PLOF.    REHAB POTENTIAL: Good   CLINICAL DECISION MAKING: Stable/uncomplicated   EVALUATION COMPLEXITY: Low     GOALS:     SHORT TERM GOALS:   STG Name Target Date Goal status  1 Pt will become independent with HEP in order to demonstrate synthesis of PT education.   08/01/2021  MET  2 Pt will be able to demonstrate ability to perform full AROM of L LE without assistance LE function for self-care and house hold duties.   08/29/2021  ongoing  3 Pt will be able to demonstrate ability to perform stairs and gait with no AD/UE and equal WB in order to demonstrate functional improvement in L LE function for self-care and house hold duties.     07/20/2021 ongoing  4 Pt will have an at least 9 pt improvement in LEFS measure in order to demonstrate MCID improvement in daily function.   07/20/2021 MET    LONG TERM GOALS:    LTG Name Target Date Goal status  1 Pt  will become independent with final HEP in order to demonstrate synthesis of PT education.   10/10/2021  ongoing  2 Pt will be able to demonstrate full floor to stand, kneeling to stand, and stand to kneeling transfer  without UE support in order to demonstrate functional improvement in L LE function for ADL/house hold duties. 08/31/2021 ongoing  3 Pt will be able to demonstrate DL squat with >/=15 lbs in order to demonstrate functional improvement in bilat LE strength for return to PLOF and exercise.   08/31/2021 ongoing  4 Pt will have an at least 18 pt improvement in LEFS measure in order to demonstrate MCID improvement in daily function.     08/31/2021 ongoing    PLAN: PT FREQUENCY: 1-2x/week   PT DURATION: 12 weeks   PLANNED INTERVENTIONS: Therapeutic exercises, Therapeutic activity, Neuro Muscular re-education, Balance training, Gait training, Patient/Family education, Joint mobilization, Stair training, Orthotic/Fit training, Aquatic Therapy, Dry Needling, Electrical stimulation, Spinal mobilization, Cryotherapy, Moist heat, scar mobilization, Taping,  Vasopneumatic device, Ultrasound, Ionotophoresis 13m/ml Dexamethasone, and Manual therapy   PLAN FOR NEXT SESSION: review of previous exercise, progress quad strength and glute strength (per MD instruction), cervical joint mobs, continue subocc release   ADaleen BoPT, DPT 07/18/21 10:18 AM   Patient name: Linda RETANAMRN: [id number]: 9September 06, 1977

## 2021-07-21 ENCOUNTER — Other Ambulatory Visit: Payer: Self-pay

## 2021-07-21 ENCOUNTER — Encounter (HOSPITAL_BASED_OUTPATIENT_CLINIC_OR_DEPARTMENT_OTHER): Payer: Self-pay | Admitting: Physical Therapy

## 2021-07-21 ENCOUNTER — Ambulatory Visit (HOSPITAL_BASED_OUTPATIENT_CLINIC_OR_DEPARTMENT_OTHER): Payer: BC Managed Care – PPO | Attending: Orthopaedic Surgery | Admitting: Physical Therapy

## 2021-07-21 DIAGNOSIS — M542 Cervicalgia: Secondary | ICD-10-CM | POA: Diagnosis present

## 2021-07-21 DIAGNOSIS — M25552 Pain in left hip: Secondary | ICD-10-CM | POA: Insufficient documentation

## 2021-07-21 DIAGNOSIS — G4486 Cervicogenic headache: Secondary | ICD-10-CM | POA: Insufficient documentation

## 2021-07-21 DIAGNOSIS — R262 Difficulty in walking, not elsewhere classified: Secondary | ICD-10-CM | POA: Insufficient documentation

## 2021-07-21 DIAGNOSIS — M6281 Muscle weakness (generalized): Secondary | ICD-10-CM | POA: Diagnosis present

## 2021-07-21 DIAGNOSIS — M25561 Pain in right knee: Secondary | ICD-10-CM | POA: Diagnosis not present

## 2021-07-21 NOTE — Therapy (Signed)
OUTPATIENT PHYSICAL THERAPY TREATMENT NOTE   Patient Name: Linda Gill MRN: [id number] DOB:[birth year], 45 y.o., female Today's Date: 07/21/2021  PCP: Bernerd Limbo, MD REFERRING PROVIDER: Crosby Oyster, MD   PT End of Session - 07/21/21 [phone]     Visit Number 14    Number of Visits 21    Date for PT Re-Evaluation 09/06/21    Authorization Type BCBS    PT Start Time 0930    PT Stop Time 1010    PT Time Calculation (min) 40 min    Equipment Utilized During Treatment Other (comment)   crutches and brace   Activity Tolerance Patient tolerated treatment well;Patient limited by pain    Behavior During Therapy WFL for tasks assessed/performed                    Past Medical History:  Diagnosis Date   Arthritis    RA   Depression    Past Surgical History:  Procedure Laterality Date   BREAST MASS EXCISION     HIP ARTHROSCOPY Left 06/05/2021   Procedure: left hip arthroscopy with labral repair and cam debridement;  Surgeon: Vanetta Mulders, MD;  Location: [facility];  Service: Orthopedics;  Laterality: Left;   Patient Active Problem List   Diagnosis Date Noted   Pulmonary embolism ([place]) 06/20/2021   DVT (deep venous thrombosis) ([place]) 06/20/2021     ONSET DATE: 06/05/2021 L HIP ARTHROSCOPY WITH LABRAL REPAIR, L CAM DEBRIDEMENT, L PINCER DEBRIDEMENT   REFERRING DIAG: M25.552 (ICD-10-CM) - Left hip pain M17.11 (ICD-10-CM) - Patellofemoral arthritis of right knee   10/17 Surgery date   THERAPY DIAG:  Pain in joint of right knee   Pain in left hip   Muscle weakness (generalized)   Difficulty walking   SUBJECTIVE:    SUBJECTIVE STATEMENT:   Pt states the hip feels better. She was able to do stairs and felt like the hip engaged better. She states the C/S feels stiff but is better. She feels that it is "heading in the right direction." She continues to feel a stiffness into her L hip.   PERTINENT HISTORY: RA, L labral tear   PAIN:  Are you having pain?  Yes VAS scale: 2/10 R side neck into ear Pain orientation: Right and Left  PAIN TYPE: throbbing, aching Pain description: constant  Aggravating factors: postural Relieving factors: resting     THERAPY DIAG:  Pain in joint of right knee  Pain in left hip  Muscle weakness (generalized)  Difficulty walking  Cervicalgia  PERTINENT HISTORY: PERTINENT HISTORY: RA, L labral tear   PRECAUTIONS: None    OBJECTIVE:    TREATMENT:  12/2  Subocc release C2-6 UPA and CPA grade III TPDN C/S paraspinals C3-6; skilled palpation and monitoring of soft tissue response throughout session  L quad STM L quad inf scoop mob grade III, S/L hip extension mob grade IV, and lateral hip mob in 90 deg flexion grade III   HEP exercises reviewed and edu given for performance at home in order to maintain manual treatment benefits  11/29  Upright back bike 34 L5 8 min  Subocc release C2-6 UPA and CPA grade III GTB bilat shoulder ER 2x10 GTB row 15x Self massage tennis ball Standing hip flexor stretch wide stance 30s 2x   11/21 Upright back bike 34 L5 8 min LAQ 5lbs 3x10 STS to thigh height chair 2x15 Sidestepping yellow TB 26ft x3 Staggered bridge L LE focus 3x10 RDL 5lbs 2x10  11/18 Upright back bike 34 L5 8 min LAQ 5lbs 3x10 Clam 3lb ankle weight 2x10 Quadruped with UE reach 20x Tall kneeling hip hinge 20x (unable to do due to R knee) Staggered bridge L LE focus 3x10     PATIENT EDUCATION:  Education details: surgical precautions/ protocol, C/S anatomy, exam findings, exercise progression, DOMS expectations, recovery times, post-surgical progression Person educated: Patient Education method: Explanation, Demonstration, Tactile cues, Verbal cues, and Handouts Education comprehension: verbalized understanding, returned demonstration, verbal cues required, and tactile cues required     HOME EXERCISE PROGRAM:   Access Code: [email or web address] URL:  [email or web address] Date: 06/19/2021 Prepared by: Daleen Bo       ASSESSMENT:   CLINICAL IMPRESSION: Pt session manual focused today in order to relieve neck stiffness and pain in addition to complaint of hip ROM restriction. Pt with report of significant relief in both C/S as well as L hip following intervention. Pt with improved C/S ROM as well as improved hip extension with gait by end of session. Plan to proceed with exercise and DN PRN at next session. Pt independent with HEP and gave verbal understanding of performance of HEP at home to maintain ROM.  Pt would benefit from continued skilled therapy in order to reach goals and maximize functional bilat LE  strength and ROM for full return to PLOF.    REHAB POTENTIAL: Good   CLINICAL DECISION MAKING: Stable/uncomplicated   EVALUATION COMPLEXITY: Low     GOALS:     SHORT TERM GOALS:   STG Name Target Date Goal status  1 Pt will become independent with HEP in order to demonstrate synthesis of PT education.   08/04/2021  MET  2 Pt will be able to demonstrate ability to perform full AROM of L LE without assistance LE function for self-care and house hold duties.   09/01/2021  ongoing  3 Pt will be able to demonstrate ability to perform stairs and gait with no AD/UE and equal WB in order to demonstrate functional improvement in L LE function for self-care and house hold duties.     07/20/2021 ongoing  4 Pt will have an at least 9 pt improvement in LEFS measure in order to demonstrate MCID improvement in daily function.   07/20/2021 MET    LONG TERM GOALS:    LTG Name Target Date Goal status  1 Pt  will become independent with final HEP in order to demonstrate synthesis of PT education.   10/13/2021  ongoing  2 Pt will be able to demonstrate full floor to stand, kneeling to stand, and stand to kneeling transfer without UE support in order to demonstrate functional improvement in L LE function for ADL/house hold  duties. 08/31/2021 ongoing  3 Pt will be able to demonstrate DL squat with >/=15 lbs in order to demonstrate functional improvement in bilat LE strength for return to PLOF and exercise.   08/31/2021 ongoing  4 Pt will have an at least 18 pt improvement in LEFS measure in order to demonstrate MCID improvement in daily function.     08/31/2021 ongoing    PLAN: PT FREQUENCY: 1-2x/week   PT DURATION: 12 weeks   PLANNED INTERVENTIONS: Therapeutic exercises, Therapeutic activity, Neuro Muscular re-education, Balance training, Gait training, Patient/Family education, Joint mobilization, Stair training, Orthotic/Fit training, Aquatic Therapy, Dry Needling, Electrical stimulation, Spinal mobilization, Cryotherapy, Moist heat, scar mobilization, Taping, Vasopneumatic device, Ultrasound, Ionotophoresis 4mg /ml Dexamethasone, and Manual therapy   PLAN FOR NEXT SESSION: review of previous exercise, progress quad strength  and glute strength (per MD instruction), cervical joint mobs, continue subocc release, hip ABD and extension strength   Daleen Bo PT, DPT 07/21/21 10:19 AM   Patient name: Linda Gill MRN: [id number] DOB: Jan 08, 1976

## 2021-07-28 ENCOUNTER — Ambulatory Visit (HOSPITAL_BASED_OUTPATIENT_CLINIC_OR_DEPARTMENT_OTHER): Payer: BC Managed Care – PPO | Admitting: Physical Therapy

## 2021-07-28 ENCOUNTER — Encounter (HOSPITAL_BASED_OUTPATIENT_CLINIC_OR_DEPARTMENT_OTHER): Payer: Self-pay | Admitting: Physical Therapy

## 2021-07-28 ENCOUNTER — Other Ambulatory Visit: Payer: Self-pay

## 2021-07-28 DIAGNOSIS — M25561 Pain in right knee: Secondary | ICD-10-CM | POA: Diagnosis not present

## 2021-07-28 DIAGNOSIS — M542 Cervicalgia: Secondary | ICD-10-CM

## 2021-07-28 DIAGNOSIS — M25552 Pain in left hip: Secondary | ICD-10-CM

## 2021-07-28 DIAGNOSIS — R262 Difficulty in walking, not elsewhere classified: Secondary | ICD-10-CM

## 2021-07-28 DIAGNOSIS — M6281 Muscle weakness (generalized): Secondary | ICD-10-CM

## 2021-07-28 NOTE — Therapy (Signed)
OUTPATIENT PHYSICAL THERAPY TREATMENT NOTE   Patient Name: Linda Gill MRN: [id number] DOB:1975-11-04, 45 y.o., female Today's Date: 07/28/2021  PCP: Bernerd Limbo, MD REFERRING PROVIDER: Crosby Oyster, MD   PT End of Session - 07/28/21 1148     Visit Number 15    Number of Visits 21    Date for PT Re-Evaluation 09/06/21    Authorization Type BCBS    PT Start Time 1105    PT Stop Time 1145    PT Time Calculation (min) 40 min    Equipment Utilized During Treatment Other (comment)   crutches and brace   Activity Tolerance Patient tolerated treatment well;Patient limited by pain    Behavior During Therapy WFL for tasks assessed/performed                     Past Medical History:  Diagnosis Date   Arthritis    RA   Depression    Past Surgical History:  Procedure Laterality Date   BREAST MASS EXCISION     HIP ARTHROSCOPY Left 06/05/2021   Procedure: left hip arthroscopy with labral repair and cam debridement;  Surgeon: Vanetta Mulders, MD;  Location: [facility];  Service: Orthopedics;  Laterality: Left;   Patient Active Problem List   Diagnosis Date Noted   Pulmonary embolism ([place]) 06/20/2021   DVT (deep venous thrombosis) ([place]) 06/20/2021     ONSET DATE: 06/05/2021 L HIP ARTHROSCOPY WITH LABRAL REPAIR, L CAM DEBRIDEMENT, L PINCER DEBRIDEMENT   REFERRING DIAG: M25.552 (ICD-10-CM) - Left hip pain M17.11 (ICD-10-CM) - Patellofemoral arthritis of right knee   10/17 Surgery date   THERAPY DIAG:  Pain in joint of right knee   Pain in left hip   Muscle weakness (generalized)   Difficulty walking   SUBJECTIVE:    SUBJECTIVE STATEMENT:   Pt states the hip just gets tired being more on her feet. She is able to sleep on her side L hip for short periods of time.   Pt states the neck is much better. She does have much less pain to the bottom of her neck.    PERTINENT HISTORY: RA, L labral tear   PAIN:  Are you having pain? Yes VAS scale: 2/10 R  knee Pain orientation: Right and Left  PAIN TYPE: throbbing, aching Pain description: constant  Aggravating factors: postural Relieving factors: resting     THERAPY DIAG:  Pain in joint of right knee  Pain in left hip  Muscle weakness (generalized)  Difficulty walking  Cervicalgia  PERTINENT HISTORY: PERTINENT HISTORY: RA, L labral tear   PRECAUTIONS: None    OBJECTIVE:    TREATMENT:  12/9   Exercises Bridge with Hip Abduction and Resistance 3x10 GTB Half Deadlift with Kettlebell 15lb KB 2x10 Side Stepping with Resistance at Ankles  GTB 11ft Squat with Chair Touch and Resistance Loop 3x10 GTB at knees Sidelying Hip Adduction 3x10 Fwd step up 3x10 6" block Hip flexor standing stretch 30s 3x    12/2  Subocc release C2-6 UPA and CPA grade III TPDN C/S paraspinals C3-6; skilled palpation and monitoring of soft tissue response throughout session  L quad STM L quad inf scoop mob grade III, S/L hip extension mob grade IV, and lateral hip mob in 90 deg flexion grade III   HEP exercises reviewed and edu given for performance at home in order to maintain manual treatment benefits  11/29  Upright back bike 34 L5 8 min  Subocc release C2-6 UPA  and CPA grade III GTB bilat shoulder ER 2x10 GTB row 15x Self massage tennis ball Standing hip flexor stretch wide stance 30s 2x   11/21 Upright back bike 34 L5 8 min LAQ 5lbs 3x10 STS to thigh height chair 2x15 Sidestepping yellow TB 48ft x3 Staggered bridge L LE focus 3x10 RDL 5lbs 2x10  11/18 Upright back bike 34 L5 8 min LAQ 5lbs 3x10 Clam 3lb ankle weight 2x10 Quadruped with UE reach 20x Tall kneeling hip hinge 20x (unable to do due to R knee) Staggered bridge L LE focus 3x10     PATIENT EDUCATION:  Education details: surgical precautions/ protocol, C/S anatomy, exam findings, exercise progression, DOMS expectations, recovery times, post-surgical progression Person educated: Patient Education  method: Explanation, Demonstration, Tactile cues, Verbal cues, and Handouts Education comprehension: verbalized understanding, returned demonstration, verbal cues required, and tactile cues required     HOME EXERCISE PROGRAM:   Access Code: [email or web address] URL: [email or web address] Date: 06/19/2021 Prepared by: Daleen Bo       ASSESSMENT:   CLINICAL IMPRESSION: Pt with good tolerance to progressed hip exercise at today's session. Pt's C/S pain is better controlled at this time and will be managed PRN. Pt progressing well with hip strengthening though there is noticeable fatigue/weakness with isolated hip extension, ABD, and stability exercise. Pt is progressing as expected with the hip at this time. Plan to trial lateral step ups and SL stability next session. Pt would benefit from continued skilled therapy in order to reach goals and maximize functional bilat LE  strength and ROM for full return to PLOF.    REHAB POTENTIAL: Good   CLINICAL DECISION MAKING: Stable/uncomplicated   EVALUATION COMPLEXITY: Low     GOALS:     SHORT TERM GOALS:   STG Name Target Date Goal status  1 Pt will become independent with HEP in order to demonstrate synthesis of PT education.   08/11/2021  MET  2 Pt will be able to demonstrate ability to perform full AROM of L LE without assistance LE function for self-care and house hold duties.   09/08/2021  ongoing  3 Pt will be able to demonstrate ability to perform stairs and gait with no AD/UE and equal WB in order to demonstrate functional improvement in L LE function for self-care and house hold duties.     07/20/2021 ongoing  4 Pt will have an at least 9 pt improvement in LEFS measure in order to demonstrate MCID improvement in daily function.   07/20/2021 MET    LONG TERM GOALS:    LTG Name Target Date Goal status  1 Pt  will become independent with final HEP in order to demonstrate synthesis of PT education.   10/20/2021  ongoing  2  Pt will be able to demonstrate full floor to stand, kneeling to stand, and stand to kneeling transfer without UE support in order to demonstrate functional improvement in L LE function for ADL/house hold duties. 08/31/2021 ongoing  3 Pt will be able to demonstrate DL squat with >/=15 lbs in order to demonstrate functional improvement in bilat LE strength for return to PLOF and exercise.   08/31/2021 ongoing  4 Pt will have an at least 18 pt improvement in LEFS measure in order to demonstrate MCID improvement in daily function.     08/31/2021 ongoing    PLAN: PT FREQUENCY: 1-2x/week   PT DURATION: 12 weeks   PLANNED INTERVENTIONS: Therapeutic exercises, Therapeutic activity, Neuro Muscular re-education, Balance training, Gait training, Patient/Family education, Joint  mobilization, Stair training, Orthotic/Fit training, Aquatic Therapy, Dry Needling, Electrical stimulation, Spinal mobilization, Cryotherapy, Moist heat, scar mobilization, Taping, Vasopneumatic device, Ultrasound, Ionotophoresis 4mg /ml Dexamethasone, and Manual therapy   PLAN FOR NEXT SESSION: review of previous exercise, progress quad strength and glute strength (per MD instruction), hip ABD and extension strength, SL balance   Daleen Bo PT, DPT 07/28/21 11:53 AM   Patient name: Linda Gill MRN: [id number] DOB: 01-21-1976

## 2021-08-07 ENCOUNTER — Ambulatory Visit (HOSPITAL_BASED_OUTPATIENT_CLINIC_OR_DEPARTMENT_OTHER): Payer: BC Managed Care – PPO | Admitting: Physical Therapy

## 2021-08-07 ENCOUNTER — Ambulatory Visit (INDEPENDENT_AMBULATORY_CARE_PROVIDER_SITE_OTHER): Payer: BC Managed Care – PPO | Admitting: Orthopaedic Surgery

## 2021-08-07 ENCOUNTER — Encounter (HOSPITAL_BASED_OUTPATIENT_CLINIC_OR_DEPARTMENT_OTHER): Payer: Self-pay | Admitting: Physical Therapy

## 2021-08-07 ENCOUNTER — Other Ambulatory Visit: Payer: Self-pay

## 2021-08-07 DIAGNOSIS — M25852 Other specified joint disorders, left hip: Secondary | ICD-10-CM

## 2021-08-07 DIAGNOSIS — M25561 Pain in right knee: Secondary | ICD-10-CM

## 2021-08-07 DIAGNOSIS — M25552 Pain in left hip: Secondary | ICD-10-CM

## 2021-08-07 DIAGNOSIS — M6281 Muscle weakness (generalized): Secondary | ICD-10-CM

## 2021-08-07 DIAGNOSIS — M542 Cervicalgia: Secondary | ICD-10-CM

## 2021-08-07 DIAGNOSIS — R262 Difficulty in walking, not elsewhere classified: Secondary | ICD-10-CM

## 2021-08-07 NOTE — Therapy (Signed)
OUTPATIENT PHYSICAL THERAPY TREATMENT NOTE   Patient Name: Linda Gill MRN: [id number] DOB:[birth year], 45 y.o., female Today's Date: 08/07/2021  PCP: Bernerd Limbo, MD REFERRING PROVIDER: Crosby Oyster, MD   PT End of Session - 08/07/21 1150     Visit Number 16    Number of Visits 21    Date for PT Re-Evaluation 09/06/21    Authorization Type BCBS    PT Start Time 1145    PT Stop Time 1225    PT Time Calculation (min) 40 min    Equipment Utilized During Treatment Other (comment)   crutches and brace   Activity Tolerance Patient tolerated treatment well;Patient limited by pain    Behavior During Therapy WFL for tasks assessed/performed                      Past Medical History:  Diagnosis Date   Arthritis    RA   Depression    Past Surgical History:  Procedure Laterality Date   BREAST MASS EXCISION     HIP ARTHROSCOPY Left 06/05/2021   Procedure: left hip arthroscopy with labral repair and cam debridement;  Surgeon: Vanetta Mulders, MD;  Location: [facility];  Service: Orthopedics;  Laterality: Left;   Patient Active Problem List   Diagnosis Date Noted   Pulmonary embolism ([place]) 06/20/2021   DVT (deep venous thrombosis) ([place]) 06/20/2021     ONSET DATE: 06/05/2021 L HIP ARTHROSCOPY WITH LABRAL REPAIR, L CAM DEBRIDEMENT, L PINCER DEBRIDEMENT   REFERRING DIAG: M25.552 (ICD-10-CM) - Left hip pain M17.11 (ICD-10-CM) - Patellofemoral arthritis of right knee   10/17 Surgery date   THERAPY DIAG:  Pain in joint of right knee   Pain in left hip   Muscle weakness (generalized)   Difficulty walking   SUBJECTIVE:    SUBJECTIVE STATEMENT:   Pt states her hip felt good after last session. Her hip muscles were tight. She still has some difficulty with walking her dogs due to slope as well as going up stairs due to fatigue.   Pt states the neck is much better. She still feels some tightness.    PERTINENT HISTORY: RA, L labral tear   PAIN:  Are you  having pain? no VAS scale: 0/10 R knee Pain orientation: Right and Left  PAIN TYPE: throbbing, aching Pain description: constant  Aggravating factors: postural Relieving factors: resting     THERAPY DIAG:  Pain in joint of right knee  Pain in left hip  Muscle weakness (generalized)  Difficulty walking  Cervicalgia  PERTINENT HISTORY: PERTINENT HISTORY: RA, L labral tear   PRECAUTIONS: None    OBJECTIVE:    TREATMENT:  12/19   Exercises  Upright bike seat 26 L7 8 min  Rower 30s intervals 4x each- level 5 Staggered stance leg press 3x10 155lbs (warm up sets not included) SL leg press 3x10 90lbs  Prone SL HS curl 3x10 35lbs SLS 30s 4x lateral step up 2x10 6" block   12/9   Exercises Bridge with Hip Abduction and Resistance 3x10 GTB Half Deadlift with Kettlebell 15lb KB 2x10 Side Stepping with Resistance at Ankles  GTB 47f Squat with Chair Touch and Resistance Loop 3x10 GTB at knees Sidelying Hip Adduction 3x10 Fwd step up 3x10 6" block Hip flexor standing stretch 30s 3x    12/2  Subocc release C2-6 UPA and CPA grade III TPDN C/S paraspinals C3-6; skilled palpation and monitoring of soft tissue response throughout session  L quad STM L  quad inf scoop mob grade III, S/L hip extension mob grade IV, and lateral hip mob in 90 deg flexion grade III   HEP exercises reviewed and edu given for performance at home in order to maintain manual treatment benefits     PATIENT EDUCATION:  Education details: surgical precautions/ protocol, C/S anatomy, exam findings, exercise progression, DOMS expectations, recovery times, post-surgical progression Person educated: Patient Education method: Explanation, Demonstration, Tactile cues, Verbal cues, and Handouts Education comprehension: verbalized understanding, returned demonstration, verbal cues required, and tactile cues required     HOME EXERCISE PROGRAM:   Access Code: [email or web address] URL:  [email or web address] Date: 06/19/2021 Prepared by: Daleen Bo       ASSESSMENT:   CLINICAL IMPRESSION: Pt able to continue with progression of L LE strengthening. Pt with proximal hip weakness with sustained repetition as well as L knee extensor endurance deficits with isolated strengthening today. However, pt is without pain and able to continue with protocol and strengthening. Plan to continue with SL stability based exercise and SL CKC. Trial hip hike, runner's step up, and Mulligan hip mob as needed depending on hip stiffness. Pt does lack end range hip extension with gait at this time.  Pt would benefit from continued skilled therapy in order to reach goals and maximize functional bilat LE  strength and ROM for full return to PLOF.    REHAB POTENTIAL: Good   CLINICAL DECISION MAKING: Stable/uncomplicated   EVALUATION COMPLEXITY: Low     GOALS:     SHORT TERM GOALS:   STG Name Target Date Goal status  1 Pt will become independent with HEP in order to demonstrate synthesis of PT education.   08/21/2021  MET  2 Pt will be able to demonstrate ability to perform full AROM of L LE without assistance LE function for self-care and house hold duties.   09/18/2021  ongoing  3 Pt will be able to demonstrate ability to perform stairs and gait with no AD/UE and equal WB in order to demonstrate functional improvement in L LE function for self-care and house hold duties.     07/20/2021 ongoing  4 Pt will have an at least 9 pt improvement in LEFS measure in order to demonstrate MCID improvement in daily function.   07/20/2021 MET    LONG TERM GOALS:    LTG Name Target Date Goal status  1 Pt  will become independent with final HEP in order to demonstrate synthesis of PT education.   10/30/2021  ongoing  2 Pt will be able to demonstrate full floor to stand, kneeling to stand, and stand to kneeling transfer without UE support in order to demonstrate functional improvement in L LE  function for ADL/house hold duties. 08/31/2021 ongoing  3 Pt will be able to demonstrate DL squat with >/=15 lbs in order to demonstrate functional improvement in bilat LE strength for return to PLOF and exercise.   08/31/2021 ongoing  4 Pt will have an at least 18 pt improvement in LEFS measure in order to demonstrate MCID improvement in daily function.     08/31/2021 ongoing    PLAN: PT FREQUENCY: 1-2x/week   PT DURATION: 12 weeks   PLANNED INTERVENTIONS: Therapeutic exercises, Therapeutic activity, Neuro Muscular re-education, Balance training, Gait training, Patient/Family education, Joint mobilization, Stair training, Orthotic/Fit training, Aquatic Therapy, Dry Needling, Electrical stimulation, Spinal mobilization, Cryotherapy, Moist heat, scar mobilization, Taping, Vasopneumatic device, Ultrasound, Ionotophoresis 40m/ml Dexamethasone, and Manual therapy   PLAN FOR NEXT SESSION: review of previous exercise, progress  quad strength and glute strength (per MD instruction), hip ABD and extension strength, SL balance   Daleen Bo PT, DPT 08/07/21 12:29 PM   Patient name: Linda Gill MRN: [id number] DOB: [birth year]

## 2021-08-07 NOTE — Progress Notes (Signed)
Post Operative Evaluation    Procedure/Date of Surgery: 06/05/21 -left hip arthroscopy with pincer and cam debridement  Interval History:   Adaya presents today for follow-up of her left hip arthroscopy.  She continues to work in physical therapy but feels somewhat plateaued at this time.  She did not need to go to [place] for a recent trip to visit her mother.  She is hoping to get to [place] soon in order to see her husband.  PMH/PSH/Family History/Social History/Meds/Allergies:    Past Medical History:  Diagnosis Date   Arthritis    RA   Depression    Past Surgical History:  Procedure Laterality Date   BREAST MASS EXCISION     HIP ARTHROSCOPY Left 06/05/2021   Procedure: left hip arthroscopy with labral repair and cam debridement;  Surgeon: [name], MD;  Location: [facility];  Service: Orthopedics;  Laterality: Left;   Social History   Socioeconomic History   Marital status: Married    Spouse name: Not on file   Number of children: Not on file   Years of education: Not on file   Highest education level: Not on file  Occupational History   Not on file  Tobacco Use   Smoking status: Never   Smokeless tobacco: Never  Vaping Use   Vaping Use: Never used  Substance and Sexual Activity   Alcohol use: Not Currently   Drug use: Never   Sexual activity: Not on file  Other Topics Concern   Not on file  Social History Narrative   Not on file   Social Determinants of Health   Financial Resource Strain: Not on file  Food Insecurity: Not on file  Transportation Needs: Not on file  Physical Activity: Not on file  Stress: Not on file  Social Connections: Not on file   Family History  Problem Relation Age of Onset   CAD Father    Diabetes Father    Clotting disorder Neg Hx    Allergies  Allergen Reactions   Moxifloxacin Rash   Penicillins Rash    Tolerated Ancef 2g IV on 06/05/2021   Shellfish Allergy Rash    Not allergic  to iodine   Sulfa Antibiotics Rash   Current Outpatient Medications  Medication Sig Dispense Refill   APIXABAN (ELIQUIS) VTE STARTER PACK (10MG  AND 5MG ) Use as directed on package. 74 each 0   apixaban (ELIQUIS) 5 MG TABS tablet Take 1 tablet (5 mg total) by mouth 2 (two) times daily. 60 tablet 0   Ascorbic Acid (VITAMIN C) 1000 MG tablet Take 1,000 mg by mouth daily.     Cholecalciferol (VITAMIN D3 PO) Take 7,000 Units by mouth daily.     FLUoxetine (PROZAC) 20 MG capsule Take 20 mg by mouth daily.     montelukast (SINGULAIR) 10 MG tablet Take 10 mg by mouth daily.     oxyCODONE (OXY IR/ROXICODONE) 5 MG immediate release tablet Take 1 tablet (5 mg total) by mouth every 4 (four) hours as needed for severe pain. (Patient not taking: Reported on 06/20/2021) 30 tablet 0   oxycodone (OXY-IR) 5 MG capsule Take 1 capsule (5 mg total) by mouth every 4 (four) hours as needed (severe pain). (Patient not taking: Reported on 06/20/2021) 20 capsule 0   vitamin B-12 (CYANOCOBALAMIN) 1000 MCG tablet  Take 2,000 mcg by mouth daily.     vitamin E 180 MG (400 UNITS) capsule Take 400 Units by mouth daily.     zinc gluconate 50 MG tablet Take 50 mg by mouth daily.     No current facility-administered medications for this visit.   No results found.  Review of Systems:   A ROS was performed including pertinent positives and negatives as documented in the HPI.   Musculoskeletal Exam:    There were no vitals taken for this visit.  Left hip range of motion 100 degrees flexion, some soreness in flexion and internal rotation about the abductor muscles.  There is moderate gluteus weakness with active abduction laying on the right side with the left side.  Imaging:    none  I personally reviewed and interpreted the radiographs.   Assessment:   45 year old female 8-weeks status post left hip arthroscopy with cam and pincer debridement as well as labral repair, overall doing well.  While she does believe that  she has plateaued in terms of her recovery and rehab there is still significant gluteus weakness which I described is very common at this point in the recovery.  I do believe that she is very consistent with ongoing continued improvement.  We discussed the slow but steady nature of this.  She will continue to work on gluteal strengthening.  I will see her back in 2 months. Plan :    -Return to clinic in 2 months    I personally saw and evaluated the patient, and participated in the management and treatment plan.  [name], MD Attending Physician, Orthopedic Surgery  This document was dictated using Dragon voice recognition software. A reasonable attempt at proof reading has been made to minimize errors.

## 2021-08-10 ENCOUNTER — Encounter (HOSPITAL_BASED_OUTPATIENT_CLINIC_OR_DEPARTMENT_OTHER): Payer: BC Managed Care – PPO | Admitting: Physical Therapy

## 2021-08-15 ENCOUNTER — Other Ambulatory Visit: Payer: Self-pay

## 2021-08-15 ENCOUNTER — Encounter (HOSPITAL_BASED_OUTPATIENT_CLINIC_OR_DEPARTMENT_OTHER): Payer: Self-pay | Admitting: Physical Therapy

## 2021-08-15 ENCOUNTER — Ambulatory Visit (HOSPITAL_BASED_OUTPATIENT_CLINIC_OR_DEPARTMENT_OTHER): Payer: BC Managed Care – PPO | Admitting: Physical Therapy

## 2021-08-15 DIAGNOSIS — M25552 Pain in left hip: Secondary | ICD-10-CM

## 2021-08-15 DIAGNOSIS — R262 Difficulty in walking, not elsewhere classified: Secondary | ICD-10-CM

## 2021-08-15 DIAGNOSIS — M25561 Pain in right knee: Secondary | ICD-10-CM

## 2021-08-15 DIAGNOSIS — M6281 Muscle weakness (generalized): Secondary | ICD-10-CM

## 2021-08-15 DIAGNOSIS — M542 Cervicalgia: Secondary | ICD-10-CM

## 2021-08-15 NOTE — Therapy (Signed)
OUTPATIENT PHYSICAL THERAPY TREATMENT NOTE   Patient Name: Linda Gill MRN: [id number] DOB:07/17/1976, 45 y.o., female Today's Date: 08/15/2021  PCP: Bernerd Limbo, MD REFERRING PROVIDER: Crosby Oyster, MD   PT End of Session - 08/15/21 1344     Visit Number 17    Number of Visits 21    Date for PT Re-Evaluation 09/06/21    Authorization Type BCBS    PT Start Time 1303    PT Stop Time 1343    PT Time Calculation (min) 40 min    Equipment Utilized During Treatment Other (comment)   crutches and brace   Activity Tolerance Patient tolerated treatment well;Patient limited by pain    Behavior During Therapy WFL for tasks assessed/performed                       Past Medical History:  Diagnosis Date   Arthritis    RA   Depression    Past Surgical History:  Procedure Laterality Date   BREAST MASS EXCISION     HIP ARTHROSCOPY Left 06/05/2021   Procedure: left hip arthroscopy with labral repair and cam debridement;  Surgeon: Vanetta Mulders, MD;  Location: [facility];  Service: Orthopedics;  Laterality: Left;   Patient Active Problem List   Diagnosis Date Noted   Pulmonary embolism ([place]) 06/20/2021   DVT (deep venous thrombosis) ([place]) 06/20/2021     ONSET DATE: 06/05/2021 L HIP ARTHROSCOPY WITH LABRAL REPAIR, L CAM DEBRIDEMENT, L PINCER DEBRIDEMENT   REFERRING DIAG: M25.552 (ICD-10-CM) - Left hip pain M17.11 (ICD-10-CM) - Patellofemoral arthritis of right knee   10/17 Surgery date   THERAPY DIAG:  Pain in joint of right knee   Pain in left hip   Muscle weakness (generalized)   Difficulty walking   SUBJECTIVE:    SUBJECTIVE STATEMENT:   Pt states her hip is sore today from walking her dogs but her neck is very painful today. She has had HA for a while now into the base of her neck.     PERTINENT HISTORY: RA, L labral tear   PAIN:  Are you having pain? Yes VAS scale: 7/10 neck Pain orientation: Right and Left  PAIN TYPE: throbbing,  aching Pain description: constant  Aggravating factors: postural Relieving factors: resting     THERAPY DIAG:  Pain in joint of right knee  Pain in left hip  Muscle weakness (generalized)  Difficulty walking  Cervicalgia  PERTINENT HISTORY: PERTINENT HISTORY: RA, L labral tear   PRECAUTIONS: None    OBJECTIVE:    TREATMENT:  12/27  Subocc release C2-6 UPA and CPA grade III TPDN C/S paraspinals C3-6; skilled palpation and monitoring of soft tissue response throughout session  L quad STM L hip Mulligan belt grade III lateral hip mob in 90 deg, inf flexion mob grade III  12/19   Exercises  Upright bike seat 26 L7 8 min  Rower 30s intervals 4x each- level 5 Staggered stance leg press 3x10 155lbs (warm up sets not included) SL leg press 3x10 90lbs  Prone SL HS curl 3x10 35lbs SLS 30s 4x lateral step up 2x10 6" block   12/9   Exercises Bridge with Hip Abduction and Resistance 3x10 GTB Half Deadlift with Kettlebell 15lb KB 2x10 Side Stepping with Resistance at Ankles  GTB 83ft Squat with Chair Touch and Resistance Loop 3x10 GTB at knees Sidelying Hip Adduction 3x10 Fwd step up 3x10 6" block Hip flexor standing stretch 30s 3x  12/2  Subocc release C2-6 UPA and CPA grade III TPDN C/S paraspinals C3-6; skilled palpation and monitoring of soft tissue response throughout session  L quad STM L quad inf scoop mob grade III, S/L hip extension mob grade IV, and lateral hip mob in 90 deg flexion grade III   HEP exercises reviewed and edu given for performance at home in order to maintain manual treatment benefits     PATIENT EDUCATION:  Education details: exercise progression, DN DOMS  expectations, recovery times, post-surgical progression Person educated: Patient Education method: Explanation, Demonstration, Tactile cues, Verbal cues, and Handouts Education comprehension: verbalized understanding, returned demonstration, verbal cues required, and  tactile cues required     HOME EXERCISE PROGRAM:   Access Code: [email or web address] URL: [email or web address] Date: 06/19/2021 Prepared by: Daleen Bo       ASSESSMENT:   CLINICAL IMPRESSION: Pt with increased neck pain and HA at beginning of session as well as antalgic gait pattern. Pt had report of improved neck pain as well as improved L hip stiffness following manual therapy. Pt instructed to continue with self hip stretching and neck pain management strategies. Session used to manage recently exacerbated pain. Continue with L hip strength as able as well as cervical DN PRN. Pt would benefit from continued skilled therapy in order to reach goals and maximize functional bilat LE  strength and ROM for full return to PLOF.    REHAB POTENTIAL: Good   CLINICAL DECISION MAKING: Stable/uncomplicated   EVALUATION COMPLEXITY: Low     GOALS:     SHORT TERM GOALS:   STG Name Target Date Goal status  1 Pt will become independent with HEP in order to demonstrate synthesis of PT education.   08/29/2021  MET  2 Pt will be able to demonstrate ability to perform full AROM of L LE without assistance LE function for self-care and house hold duties.   09/26/2021  ongoing  3 Pt will be able to demonstrate ability to perform stairs and gait with no AD/UE and equal WB in order to demonstrate functional improvement in L LE function for self-care and house hold duties.     07/20/2021 ongoing  4 Pt will have an at least 9 pt improvement in LEFS measure in order to demonstrate MCID improvement in daily function.   07/20/2021 MET    LONG TERM GOALS:    LTG Name Target Date Goal status  1 Pt  will become independent with final HEP in order to demonstrate synthesis of PT education.   11/07/2021  ongoing  2 Pt will be able to demonstrate full floor to stand, kneeling to stand, and stand to kneeling transfer without UE support in order to demonstrate functional improvement in L LE function for  ADL/house hold duties. 08/31/2021 ongoing  3 Pt will be able to demonstrate DL squat with >/=15 lbs in order to demonstrate functional improvement in bilat LE strength for return to PLOF and exercise.   08/31/2021 ongoing  4 Pt will have an at least 18 pt improvement in LEFS measure in order to demonstrate MCID improvement in daily function.     08/31/2021 ongoing    PLAN: PT FREQUENCY: 1-2x/week   PT DURATION: 12 weeks   PLANNED INTERVENTIONS: Therapeutic exercises, Therapeutic activity, Neuro Muscular re-education, Balance training, Gait training, Patient/Family education, Joint mobilization, Stair training, Orthotic/Fit training, Aquatic Therapy, Dry Needling, Electrical stimulation, Spinal mobilization, Cryotherapy, Moist heat, scar mobilization, Taping, Vasopneumatic device, Ultrasound, Ionotophoresis 4mg /ml Dexamethasone, and Manual therapy   PLAN  FOR NEXT SESSION: review of previous exercise, progress quad strength and glute strength (per MD instruction), hip ABD and extension strength, SL balance   Daleen Bo PT, DPT 08/15/21 1:48 PM   Patient name: Linda Gill MRN: [id number] DOB: Feb 08, 1976

## 2021-08-17 ENCOUNTER — Encounter (HOSPITAL_BASED_OUTPATIENT_CLINIC_OR_DEPARTMENT_OTHER): Payer: Self-pay | Admitting: Physical Therapy

## 2021-08-17 ENCOUNTER — Ambulatory Visit (HOSPITAL_BASED_OUTPATIENT_CLINIC_OR_DEPARTMENT_OTHER): Payer: BC Managed Care – PPO | Admitting: Physical Therapy

## 2021-08-17 ENCOUNTER — Other Ambulatory Visit: Payer: Self-pay

## 2021-08-17 DIAGNOSIS — M542 Cervicalgia: Secondary | ICD-10-CM

## 2021-08-17 DIAGNOSIS — M25561 Pain in right knee: Secondary | ICD-10-CM

## 2021-08-17 DIAGNOSIS — R262 Difficulty in walking, not elsewhere classified: Secondary | ICD-10-CM

## 2021-08-17 DIAGNOSIS — M6281 Muscle weakness (generalized): Secondary | ICD-10-CM

## 2021-08-17 DIAGNOSIS — M25552 Pain in left hip: Secondary | ICD-10-CM

## 2021-08-17 NOTE — Therapy (Signed)
OUTPATIENT PHYSICAL THERAPY TREATMENT NOTE   Patient Name: Linda Gill MRN: [id number] DOB:Jan 01, 1976, 45 y.o., female Today's Date: 08/17/2021  PCP: Tracey Harries, MD REFERRING PROVIDER: Merton Border, MD   PT End of Session - 08/17/21 1101     Visit Number 18    Number of Visits 21    Date for PT Re-Evaluation 09/06/21    Authorization Type BCBS    PT Start Time 1100    PT Stop Time 1140    PT Time Calculation (min) 40 min    Equipment Utilized During Treatment Other (comment)   crutches and brace   Activity Tolerance Patient tolerated treatment well;Patient limited by pain    Behavior During Therapy WFL for tasks assessed/performed                        Past Medical History:  Diagnosis Date   Arthritis    RA   Depression    Past Surgical History:  Procedure Laterality Date   BREAST MASS EXCISION     HIP ARTHROSCOPY Left 06/05/2021   Procedure: left hip arthroscopy with labral repair and cam debridement;  Surgeon: [name], MD;  Location: [facility] OR;  Service: Orthopedics;  Laterality: Left;   Patient Active Problem List   Diagnosis Date Noted   Pulmonary embolism ([facility]) 06/20/2021   DVT (deep venous thrombosis) ([facility]) 06/20/2021     ONSET DATE: 06/05/2021 L HIP ARTHROSCOPY WITH LABRAL REPAIR, L CAM DEBRIDEMENT, L PINCER DEBRIDEMENT   REFERRING DIAG: M25.552 (ICD-10-CM) - Left hip pain M17.11 (ICD-10-CM) - Patellofemoral arthritis of right knee   10/17 Surgery date   THERAPY DIAG:  Pain in joint of right knee   Pain in left hip   Muscle weakness (generalized)   Difficulty walking   SUBJECTIVE:    SUBJECTIVE STATEMENT:   Pt states the hip feels much better after last session. The hip feels much looser than previous sessions. Pt states she is still having pain and HA but it has improved.    PERTINENT HISTORY: RA, L labral tear   PAIN:  Are you having pain? Yes VAS scale: 3/10 neck; 1/10 in the hip Pain orientation: Right  and Left  PAIN TYPE: throbbing, aching Pain description: constant  Aggravating factors: postural Relieving factors: resting     THERAPY DIAG:  Pain in joint of right knee  Muscle weakness (generalized)  Pain in left hip  Difficulty walking  Cervicalgia  PERTINENT HISTORY: PERTINENT HISTORY: RA, L labral tear   PRECAUTIONS: None    OBJECTIVE:    TREATMENT:  12/29  C2-6 UPA and CPA grade III TPDN C/S paraspinals C3-6; skilled palpation and monitoring of soft tissue response throughout session  Paraspinals STM bilat  L hip Mulligan belt grade III lateral hip mob in 90 deg, inf flexion mob grade III Piriformis stretch 30s 3x Fig 4 bridge 10x Standing lunge 10x Review of HEP and updates  12/27  Subocc release C2-6 UPA and CPA grade III TPDN C/S paraspinals C3-6; skilled palpation and monitoring of soft tissue response throughout session  L quad STM L hip Mulligan belt grade III lateral hip mob in 90 deg, inf flexion mob grade III  12/19   Exercises  Upright bike seat 26 L7 8 min  Rower 30s intervals 4x each- level 5 Staggered stance leg press 3x10 155lbs (warm up sets not included) SL leg press 3x10 90lbs  Prone SL HS curl 3x10 35lbs SLS 30s 4x lateral  step up 2x10 6" block   12/9   Exercises Bridge with Hip Abduction and Resistance 3x10 GTB Half Deadlift with Kettlebell 15lb KB 2x10 Side Stepping with Resistance at Ankles  GTB 24ft Squat with Chair Touch and Resistance Loop 3x10 GTB at knees Sidelying Hip Adduction 3x10 Fwd step up 3x10 6" block Hip flexor standing stretch 30s 3x    12/2  Subocc release C2-6 UPA and CPA grade III TPDN C/S paraspinals C3-6; skilled palpation and monitoring of soft tissue response throughout session  L quad STM L quad inf scoop mob grade III, S/L hip extension mob grade IV, and lateral hip mob in 90 deg flexion grade III   HEP exercises reviewed and edu given for performance at home in order to maintain  manual treatment benefits     PATIENT EDUCATION:  Education details: exercise progression, DN DOMS  expectations, recovery times, post-surgical progression Person educated: Patient Education method: Explanation, Demonstration, Tactile cues, Verbal cues, and Handouts Education comprehension: verbalized understanding, returned demonstration, verbal cues required, and tactile cues required     HOME EXERCISE PROGRAM:   Access Code: [email or web address] URL: [email or web address] Date: 06/19/2021 Prepared by: Daleen Bo       ASSESSMENT:   CLINICAL IMPRESSION: Pt reports improvement with C/S pain and hip pain following manual therapy and exercise. Pt responds very well to C/S DN as well as L mulligan hip mobilization. C/S ROM and tissue tension improved as well as stance time on L LE following. Pt HEP updated to progress hip strength as well as begin working on SL stability. Plan to continue with strength as able at future sessions. Pt would benefit from continued skilled therapy in order to reach goals and maximize functional bilat LE  strength and ROM for full return to PLOF.    REHAB POTENTIAL: Good   CLINICAL DECISION MAKING: Stable/uncomplicated   EVALUATION COMPLEXITY: Low     GOALS:     SHORT TERM GOALS:   STG Name Target Date Goal status  1 Pt will become independent with HEP in order to demonstrate synthesis of PT education.   08/31/2021  MET  2 Pt will be able to demonstrate ability to perform full AROM of L LE without assistance LE function for self-care and house hold duties.   09/28/2021  ongoing  3 Pt will be able to demonstrate ability to perform stairs and gait with no AD/UE and equal WB in order to demonstrate functional improvement in L LE function for self-care and house hold duties.     07/20/2021 ongoing  4 Pt will have an at least 9 pt improvement in LEFS measure in order to demonstrate MCID improvement in daily function.   07/20/2021 MET    LONG TERM  GOALS:    LTG Name Target Date Goal status  1 Pt  will become independent with final HEP in order to demonstrate synthesis of PT education.   11/09/2021  ongoing  2 Pt will be able to demonstrate full floor to stand, kneeling to stand, and stand to kneeling transfer without UE support in order to demonstrate functional improvement in L LE function for ADL/house hold duties. 08/31/2021 ongoing  3 Pt will be able to demonstrate DL squat with >/=15 lbs in order to demonstrate functional improvement in bilat LE strength for return to PLOF and exercise.   08/31/2021 ongoing  4 Pt will have an at least 18 pt improvement in LEFS measure in order to demonstrate MCID improvement in daily function.  08/31/2021 ongoing    PLAN: PT FREQUENCY: 1-2x/week   PT DURATION: 12 weeks   PLANNED INTERVENTIONS: Therapeutic exercises, Therapeutic activity, Neuro Muscular re-education, Balance training, Gait training, Patient/Family education, Joint mobilization, Stair training, Orthotic/Fit training, Aquatic Therapy, Dry Needling, Electrical stimulation, Spinal mobilization, Cryotherapy, Moist heat, scar mobilization, Taping, Vasopneumatic device, Ultrasound, Ionotophoresis 4mg /ml Dexamethasone, and Manual therapy   PLAN FOR NEXT SESSION: review of previous exercise, progress quad strength and glute strength (per MD instruction), hip ABD and extension strength, SL balance; step up for HEP   Daleen Bo PT, DPT 08/17/21 11:41 AM   Patient name: Linda Gill MRN: [id number] DOB: 01/25/1976

## 2021-08-22 ENCOUNTER — Ambulatory Visit (HOSPITAL_BASED_OUTPATIENT_CLINIC_OR_DEPARTMENT_OTHER): Payer: BC Managed Care – PPO | Admitting: Physical Therapy

## 2021-08-25 ENCOUNTER — Other Ambulatory Visit: Payer: Self-pay

## 2021-08-25 ENCOUNTER — Ambulatory Visit (HOSPITAL_BASED_OUTPATIENT_CLINIC_OR_DEPARTMENT_OTHER): Payer: BC Managed Care – PPO | Attending: Orthopaedic Surgery | Admitting: Physical Therapy

## 2021-08-25 ENCOUNTER — Encounter (HOSPITAL_BASED_OUTPATIENT_CLINIC_OR_DEPARTMENT_OTHER): Payer: Self-pay | Admitting: Physical Therapy

## 2021-08-25 DIAGNOSIS — R262 Difficulty in walking, not elsewhere classified: Secondary | ICD-10-CM | POA: Insufficient documentation

## 2021-08-25 DIAGNOSIS — M25561 Pain in right knee: Secondary | ICD-10-CM | POA: Insufficient documentation

## 2021-08-25 DIAGNOSIS — M25552 Pain in left hip: Secondary | ICD-10-CM | POA: Insufficient documentation

## 2021-08-25 DIAGNOSIS — M542 Cervicalgia: Secondary | ICD-10-CM | POA: Diagnosis present

## 2021-08-25 DIAGNOSIS — M6281 Muscle weakness (generalized): Secondary | ICD-10-CM | POA: Diagnosis present

## 2021-08-25 NOTE — Therapy (Addendum)
OUTPATIENT PHYSICAL THERAPY RE-CERTIFICATION   Patient Name: Linda Gill MRN: [id number] DOB:[birth year], 46 y.o., female Today's Date: 08/25/2021  PCP: Bernerd Limbo, MD REFERRING PROVIDER: Crosby Oyster, MD   PT End of Session - 08/25/21 1059     Visit Number 19    Number of Visits 35    Date for PT Re-Evaluation 11/23/21    Authorization Type BCBS    PT Start Time 1100    PT Stop Time 1140    PT Time Calculation (min) 40 min    Equipment Utilized During Treatment Other (comment)   crutches and brace   Activity Tolerance Patient tolerated treatment well;Patient limited by pain    Behavior During Therapy WFL for tasks assessed/performed                        Past Medical History:  Diagnosis Date   Arthritis    RA   Depression    Past Surgical History:  Procedure Laterality Date   BREAST MASS EXCISION     HIP ARTHROSCOPY Left 06/05/2021   Procedure: left hip arthroscopy with labral repair and cam debridement;  Surgeon: Vanetta Mulders, MD;  Location: [facility];  Service: Orthopedics;  Laterality: Left;   Patient Active Problem List   Diagnosis Date Noted   Pulmonary embolism ([place]) 06/20/2021   DVT (deep venous thrombosis) ([place]) 06/20/2021     ONSET DATE: 06/05/2021 L HIP ARTHROSCOPY WITH LABRAL REPAIR, L CAM DEBRIDEMENT, L PINCER DEBRIDEMENT   REFERRING DIAG: M25.552 (ICD-10-CM) - Left hip pain M17.11 (ICD-10-CM) - Patellofemoral arthritis of right knee   10/17 Surgery date   THERAPY DIAG:  Pain in joint of right knee   Pain in left hip   Muscle weakness (generalized)   Difficulty walking   SUBJECTIVE:    SUBJECTIVE STATEMENT:   Pt states the neck feels better and the hip feels much better. She is switching her RA meds which may help manage her joint pain. She feels the manual and exercises have helped a lot with the knee. The neck just feels stiff.    PERTINENT HISTORY: RA, L labral tear   PAIN:  Are you having pain? No VAS  scale: 0/10 in the hip Pain orientation: Right and Left  PAIN TYPE: throbbing, aching Pain description: constant  Aggravating factors: postural Relieving factors: resting     THERAPY DIAG:  Pain in joint of right knee  Muscle weakness (generalized)  Pain in left hip  Difficulty walking  PERTINENT HISTORY: PERTINENT HISTORY: RA, L labral tear   PRECAUTIONS: None    OBJECTIVE:   HHD (measured in lbs)  L hip flexion 35, ext 26.1, ABD 31.1 , ADD 20.9 , IR 21.0,  ER 18.9  R hip flexion 35, ext 35.7, ABD 36.9, ADD 35.5, IR 38.1, ER2  26.2  L hip AROM: 115 flexion, IR 20, ER 28, ABD 30 ext 10 L Hip PROM WFL   Gait: WNL   TREATMENT:  1/6  L hip Mulligan belt grade III lateral hip mob in 90 deg, inf flexion mob grade III Piriformis stretch 30s 3x Bridge 10x for pain modulation Standing lunge fwd and lateral 2x10 SLS 30s 4x   12/29  C2-6 UPA and CPA grade III TPDN C/S paraspinals C3-6; skilled palpation and monitoring of soft tissue response throughout session  Paraspinals STM bilat  L hip Mulligan belt grade III lateral hip mob in 90 deg, inf flexion mob grade III Piriformis stretch 30s  3x Fig 4 bridge 10x Standing lunge 10x Review of HEP and updates  12/27  Subocc release C2-6 UPA and CPA grade III TPDN C/S paraspinals C3-6; skilled palpation and monitoring of soft tissue response throughout session  L quad STM L hip Mulligan belt grade III lateral hip mob in 90 deg, inf flexion mob grade III  12/19   Exercises  Upright bike seat 26 L7 8 min  Rower 30s intervals 4x each- level 5 Staggered stance leg press 3x10 155lbs (warm up sets not included) SL leg press 3x10 90lbs  Prone SL HS curl 3x10 35lbs SLS 30s 4x lateral step up 2x10 6" block   12/9   Exercises Bridge with Hip Abduction and Resistance 3x10 GTB Half Deadlift with Kettlebell 15lb KB 2x10 Side Stepping with Resistance at Ankles  GTB 80ft Squat with Chair Touch and Resistance  Loop 3x10 GTB at knees Sidelying Hip Adduction 3x10 Fwd step up 3x10 6" block Hip flexor standing stretch 30s 3x    12/2  Subocc release C2-6 UPA and CPA grade III TPDN C/S paraspinals C3-6; skilled palpation and monitoring of soft tissue response throughout session  L quad STM L quad inf scoop mob grade III, S/L hip extension mob grade IV, and lateral hip mob in 90 deg flexion grade III   HEP exercises reviewed and edu given for performance at home in order to maintain manual treatment benefits     PATIENT EDUCATION:  Education details: exercise progression, DN DOMS  expectations, recovery times, post-surgical progression Person educated: Patient Education method: Explanation, Demonstration, Tactile cues, Verbal cues, and Handouts Education comprehension: verbalized understanding, returned demonstration, verbal cues required, and tactile cues required     HOME EXERCISE PROGRAM:   Access Code: [email or web address] URL: [email or web address] Date: 06/19/2021 Prepared by: Daleen Bo       ASSESSMENT:   CLINICAL IMPRESSION: Pt with good improvement in hip A/PROM given recent introduction of more aggressive joint mobilization. As demonstrated by objective measurements, pt is still limited in AROM as well as force output of L LE vs R LE. Pt is progressing well with therapy but is limited by history of RA/polyarthralgia in addition to other orthopedic issues. Pt has progressed to SL stability and strengthening with good results. Pt still limited with functional community mobility, caregiver duties, and exercise/recreation at this time. Pt would benefit from continued skilled therapy in order to reach goals and maximize functional bilat LE  strength and ROM for full return to PLOF.    REHAB POTENTIAL: Good   CLINICAL DECISION MAKING: Stable/uncomplicated   EVALUATION COMPLEXITY: Low     GOALS:     SHORT TERM GOALS:   STG Name Target Date Goal status  1 Pt will become  independent with HEP in order to demonstrate synthesis of PT education.   09/08/2021  MET  2 Pt will be able to demonstrate ability to perform full AROM of L LE without assistance LE function for self-care and house hold duties.   10/06/2021  ongoing  3 Pt will be able to demonstrate ability to perform stairs and gait with no AD/UE and equal WB in order to demonstrate functional improvement in L LE function for self-care and house hold duties.     07/20/2021 ongoing  4 Pt will have an at least 9 pt improvement in LEFS measure in order to demonstrate MCID improvement in daily function.   07/20/2021 MET    LONG TERM GOALS:    LTG Name Target Date Goal status  1 Pt  will become independent with final HEP in order to demonstrate synthesis of PT education.   11/17/2021  ongoing  2 Pt will be able to demonstrate full floor to stand, kneeling to stand, and stand to kneeling transfer without UE support in order to demonstrate functional improvement in L LE function for ADL/house hold duties. 08/31/2021 ongoing  3 Pt will be able to demonstrate DL squat with >/=15 lbs in order to demonstrate functional improvement in bilat LE strength for return to PLOF and exercise.   08/31/2021 ongoing  4 Pt will have an at least 18 pt improvement in LEFS measure in order to demonstrate MCID improvement in daily function.     08/31/2021 ongoing    PLAN: PT FREQUENCY: 1-2x/week   PT DURATION: 10 weeks   PLANNED INTERVENTIONS: Therapeutic exercises, Therapeutic activity, Neuro Muscular re-education, Balance training, Gait training, Patient/Family education, Joint mobilization, Stair training, Orthotic/Fit training, Aquatic Therapy, Dry Needling, Electrical stimulation, Spinal mobilization, Cryotherapy, Moist heat, scar mobilization, Taping, Vasopneumatic device, Ultrasound, Ionotophoresis 4mg /ml Dexamethasone, and Manual therapy   PLAN FOR NEXT SESSION: review of previous exercise, progress quad strength and glute  strength (per MD instruction), hip ABD and extension strength, SL balance; step up for HEP   Daleen Bo PT, DPT 08/25/21 11:59 AM   Patient name: Linda Gill MRN: [id number] DOB: [birth year]

## 2021-08-29 ENCOUNTER — Ambulatory Visit (HOSPITAL_BASED_OUTPATIENT_CLINIC_OR_DEPARTMENT_OTHER): Payer: BC Managed Care – PPO | Admitting: Physical Therapy

## 2021-08-29 ENCOUNTER — Other Ambulatory Visit: Payer: Self-pay

## 2021-08-29 ENCOUNTER — Encounter (HOSPITAL_BASED_OUTPATIENT_CLINIC_OR_DEPARTMENT_OTHER): Payer: Self-pay | Admitting: Physical Therapy

## 2021-08-29 DIAGNOSIS — M25561 Pain in right knee: Secondary | ICD-10-CM | POA: Diagnosis not present

## 2021-08-29 DIAGNOSIS — M6281 Muscle weakness (generalized): Secondary | ICD-10-CM

## 2021-08-29 DIAGNOSIS — M542 Cervicalgia: Secondary | ICD-10-CM

## 2021-08-29 DIAGNOSIS — R262 Difficulty in walking, not elsewhere classified: Secondary | ICD-10-CM

## 2021-08-29 DIAGNOSIS — M25552 Pain in left hip: Secondary | ICD-10-CM

## 2021-08-29 NOTE — Therapy (Signed)
OUTPATIENT PHYSICAL THERAPY TREATMENT   Patient Name: Linda Gill MRN: [id number] DOB:12/10/1975, 46 y.o., female Today's Date: 08/29/2021  PCP: Tracey Harries, MD REFERRING PROVIDER: Merton Border, MD   PT End of Session - 08/29/21 0935     Visit Number 20    Number of Visits 35    Date for PT Re-Evaluation 11/23/21    Authorization Type BCBS    PT Start Time 0930    PT Stop Time 1010    PT Time Calculation (min) 40 min    Equipment Utilized During Treatment Other (comment)   crutches and brace   Activity Tolerance Patient tolerated treatment well;Patient limited by pain    Behavior During Therapy WFL for tasks assessed/performed                    Past Medical History:  Diagnosis Date   Arthritis    RA   Depression    Past Surgical History:  Procedure Laterality Date   BREAST MASS EXCISION     HIP ARTHROSCOPY Left 06/05/2021   Procedure: left hip arthroscopy with labral repair and cam debridement;  Surgeon: [name], MD;  Location: [facility] OR;  Service: Orthopedics;  Laterality: Left;   Patient Active Problem List   Diagnosis Date Noted   Pulmonary embolism (HCC) 06/20/2021   DVT (deep venous thrombosis) (HCC) 06/20/2021     ONSET DATE: 06/05/2021 L HIP ARTHROSCOPY WITH LABRAL REPAIR, L CAM DEBRIDEMENT, L PINCER DEBRIDEMENT   REFERRING DIAG: M25.552 (ICD-10-CM) - Left hip pain M17.11 (ICD-10-CM) - Patellofemoral arthritis of right knee   10/17 Surgery date   THERAPY DIAG:  Pain in joint of right knee   Pain in left hip   Muscle weakness (generalized)   Difficulty walking   SUBJECTIVE:    SUBJECTIVE STATEMENT:   Pt states the hip feels good today. She is having less of that pinching pain. Walking in the grocery stores, she feels it is more normal. The R knee is more bothersome. Pt states the hip was sore for about 24 hours. She also was moving boxes more in order to prepare for moving. Pt states the neck does not hurt but is very  stiff.    PERTINENT HISTORY: RA, L labral tear   PAIN:  Are you having pain? Yes VAS scale: 5/10 in the R knee Pain orientation: Right and Left  PAIN TYPE: throbbing, aching Pain description: constant  Aggravating factors: postural Relieving factors: resting     THERAPY DIAG:  Pain in joint of right knee  Muscle weakness (generalized)  Pain in left hip  Difficulty walking  Cervicalgia  PERTINENT HISTORY: PERTINENT HISTORY: RA, L labral tear   PRECAUTIONS: None    OBJECTIVE:     TREATMENT: 1/10  L hip Mulligan belt grade IV lateral hip mob in 90 deg, inf flexion mob grade III Piriformis stretch 30s 3x Fig 4 2x10 for pain modulation Standing lunge fwd and lateral 10x Step up 8" 2x10- fwd and lateral Standing hip ABD taps 3x10 each SLS 30s 4x  Attempted split squat- too painful in to the L glute Hip thruster no weight 3x10 - L foot in rear  1/6  L hip Mulligan belt grade III lateral hip mob in 90 deg, inf flexion mob grade III Piriformis stretch 30s 3x Bridge 10x for pain modulation Standing lunge fwd and lateral 2x10 SLS 30s 4x   12/29  C2-6 UPA and CPA grade III TPDN C/S paraspinals C3-6; skilled palpation  and monitoring of soft tissue response throughout session  Paraspinals STM bilat  L hip Mulligan belt grade III lateral hip mob in 90 deg, inf flexion mob grade III Piriformis stretch 30s 3x Fig 4 bridge 10x Standing lunge 10x Review of HEP and updates  12/27  Subocc release C2-6 UPA and CPA grade III TPDN C/S paraspinals C3-6; skilled palpation and monitoring of soft tissue response throughout session  L quad STM L hip Mulligan belt grade III lateral hip mob in 90 deg, inf flexion mob grade III     PATIENT EDUCATION:  Education details: exercise progression, DN DOMS  expectations, recovery times, post-surgical progression Person educated: Patient Education method: Explanation, Demonstration, Tactile cues, Verbal cues, and  Handouts Education comprehension: verbalized understanding, returned demonstration, verbal cues required, and tactile cues required     HOME EXERCISE PROGRAM:   Access Code: [email or web address] URL: [email or web address] Date: 06/19/2021 Prepared by: Daleen Bo       ASSESSMENT:   CLINICAL IMPRESSION: Pt able to continue with SL stability and strengthening without pain. Pt still demonstrates L hip stiffness but improved hip and ER extension with gait following mobilization. Pt unable to tolerate loaded split squat and lunge positions today due to hip pain/weakness. Pt's increased soreness and activity over the weekend may be contributing factor to today's session. Pt was able to perform hip extension strengthening without pain with BW reduce position. Plan to progress SL balance as tolerated at next session and add SL stability to HEP. Pt would benefit from continued skilled therapy in order to reach goals and maximize functional bilat LE  strength and ROM for full return to PLOF.    REHAB POTENTIAL: Good   CLINICAL DECISION MAKING: Stable/uncomplicated   EVALUATION COMPLEXITY: Low     GOALS:     SHORT TERM GOALS:   STG Name Target Date Goal status  1 Pt will become independent with HEP in order to demonstrate synthesis of PT education.   09/12/2021  MET  2 Pt will be able to demonstrate ability to perform full AROM of L LE without assistance LE function for self-care and house hold duties.   10/10/2021  ongoing  3 Pt will be able to demonstrate ability to perform stairs and gait with no AD/UE and equal WB in order to demonstrate functional improvement in L LE function for self-care and house hold duties.     07/20/2021 ongoing  4 Pt will have an at least 9 pt improvement in LEFS measure in order to demonstrate MCID improvement in daily function.   07/20/2021 MET    LONG TERM GOALS:    LTG Name Target Date Goal status  1 Pt  will become independent with final HEP in order  to demonstrate synthesis of PT education.   11/21/2021  ongoing  2 Pt will be able to demonstrate full floor to stand, kneeling to stand, and stand to kneeling transfer without UE support in order to demonstrate functional improvement in L LE function for ADL/house hold duties. 08/31/2021 ongoing  3 Pt will be able to demonstrate DL squat with >/=15 lbs in order to demonstrate functional improvement in bilat LE strength for return to PLOF and exercise.   08/31/2021 ongoing  4 Pt will have an at least 18 pt improvement in LEFS measure in order to demonstrate MCID improvement in daily function.     08/31/2021 ongoing    PLAN: PT FREQUENCY: 1-2x/week   PT DURATION: 10 weeks   PLANNED INTERVENTIONS: Therapeutic exercises, Therapeutic  activity, Neuro Muscular re-education, Balance training, Gait training, Patient/Family education, Joint mobilization, Stair training, Orthotic/Fit training, Aquatic Therapy, Dry Needling, Electrical stimulation, Spinal mobilization, Cryotherapy, Moist heat, scar mobilization, Taping, Vasopneumatic device, Ultrasound, Ionotophoresis 4mg /ml Dexamethasone, and Manual therapy   PLAN FOR NEXT SESSION: progress SL stability and add SL stability to HEP  Daleen Bo PT, DPT 08/29/21 10:15 AM   Patient name: Linda Gill MRN: [id number] DOB: 05-15-1976

## 2021-09-01 ENCOUNTER — Encounter (HOSPITAL_BASED_OUTPATIENT_CLINIC_OR_DEPARTMENT_OTHER): Payer: Self-pay | Admitting: Physical Therapy

## 2021-09-01 ENCOUNTER — Other Ambulatory Visit: Payer: Self-pay

## 2021-09-01 ENCOUNTER — Ambulatory Visit (HOSPITAL_BASED_OUTPATIENT_CLINIC_OR_DEPARTMENT_OTHER): Payer: BC Managed Care – PPO | Admitting: Physical Therapy

## 2021-09-01 DIAGNOSIS — M25552 Pain in left hip: Secondary | ICD-10-CM

## 2021-09-01 DIAGNOSIS — M6281 Muscle weakness (generalized): Secondary | ICD-10-CM

## 2021-09-01 DIAGNOSIS — M542 Cervicalgia: Secondary | ICD-10-CM

## 2021-09-01 DIAGNOSIS — R262 Difficulty in walking, not elsewhere classified: Secondary | ICD-10-CM

## 2021-09-01 DIAGNOSIS — M25561 Pain in right knee: Secondary | ICD-10-CM

## 2021-09-01 NOTE — Therapy (Addendum)
OUTPATIENT PHYSICAL THERAPY TREATMENT   Patient Name: JLYNN LY MRN: [id number] DOB:1975/11/19, 46 y.o., female Today's Date: 09/01/2021  PCP: Tracey Harries, MD REFERRING PROVIDER: Merton Border, MD   PT End of Session - 09/01/21 [phone]     Visit Number 21    Number of Visits 35    Date for PT Re-Evaluation 11/23/21    Authorization Type BCBS    PT Start Time 0930    PT Stop Time 1010    PT Time Calculation (min) 40 min    Equipment Utilized During Treatment Other (comment)   crutches and brace   Activity Tolerance Patient tolerated treatment well;Patient limited by pain    Behavior During Therapy WFL for tasks assessed/performed                     Past Medical History:  Diagnosis Date   Arthritis    RA   Depression    Past Surgical History:  Procedure Laterality Date   BREAST MASS EXCISION     HIP ARTHROSCOPY Left 06/05/2021   Procedure: left hip arthroscopy with labral repair and cam debridement;  Surgeon: [name], MD;  Location: [facility] OR;  Service: Orthopedics;  Laterality: Left;   Patient Active Problem List   Diagnosis Date Noted   Pulmonary embolism (HCC) 06/20/2021   DVT (deep venous thrombosis) (HCC) 06/20/2021     ONSET DATE: 06/05/2021 L HIP ARTHROSCOPY WITH LABRAL REPAIR, L CAM DEBRIDEMENT, L PINCER DEBRIDEMENT   REFERRING DIAG: M25.552 (ICD-10-CM) - Left hip pain M17.11 (ICD-10-CM) - Patellofemoral arthritis of right knee   10/17 Surgery date   THERAPY DIAG:  Pain in joint of right knee   Pain in left hip   Muscle weakness (generalized)   Difficulty walking   SUBJECTIVE:    SUBJECTIVE STATEMENT:   Pt states the hip feels a little sore today from walking the dog. She feels her RA is contributing to her pain currently. She is getting new meds delivered today. Pt feels she might have slowed down in progress due to the RA.   Pt states her neck is still stiff today with L sided rotational pain   PERTINENT HISTORY: RA,  L labral tear   PAIN:  Are you having pain? Yes VAS scale: 5/10 in the R knee, 3/10 into L hip Pain orientation: Right and Left  PAIN TYPE: throbbing, aching Pain description: constant  Aggravating factors: postural Relieving factors: resting     THERAPY DIAG:  Pain in joint of right knee  Muscle weakness (generalized)  Pain in left hip  Difficulty walking  Cervicalgia  PERTINENT HISTORY: PERTINENT HISTORY: RA, L labral tear   PRECAUTIONS: None    OBJECTIVE:     TREATMENT:  1/13  L hip Mulligan belt grade IV lateral hip mob in 90 deg, inf flexion mob grade III; S/L extension mob grade III   C2-6 UPA and CPA grade III TPDN C/S paraspinals C3-6; skilled palpation and monitoring of soft tissue response throughout session  L C/S Paraspinals STM   1/10  L hip Mulligan belt grade IV lateral hip mob in 90 deg, inf flexion mob grade III Piriformis stretch 30s 3x Fig 4 2x10 for pain modulation Standing lunge fwd and lateral 10x Step up 8" 2x10- fwd and lateral Standing hip ABD taps 3x10 each SLS 30s 4x  Attempted split squat- too painful in to the L glute Hip thruster no weight 3x10 - L foot in rear  1/6  L hip Mulligan belt grade III lateral hip mob in 90 deg, inf flexion mob grade III Piriformis stretch 30s 3x Bridge 10x for pain modulation Standing lunge fwd and lateral 2x10 SLS 30s 4x   12/29  C2-6 UPA and CPA grade III TPDN C/S paraspinals C3-6; skilled palpation and monitoring of soft tissue response throughout session  Paraspinals STM bilat  L hip Mulligan belt grade III lateral hip mob in 90 deg, inf flexion mob grade III Piriformis stretch 30s 3x Fig 4 bridge 10x Standing lunge 10x Review of HEP and updates  12/27  Subocc release C2-6 UPA and CPA grade III TPDN C/S paraspinals C3-6; skilled palpation and monitoring of soft tissue response throughout session  L quad STM L hip Mulligan belt grade III lateral hip mob in 90 deg, inf flexion  mob grade III     PATIENT EDUCATION:  Education details: exercise progression, DN DOMS  expectations, recovery times, post-surgical progression Person educated: Patient Education method: Explanation, Demonstration, Tactile cues, Verbal cues, and Handouts Education comprehension: verbalized understanding, returned demonstration, verbal cues required, and tactile cues required     HOME EXERCISE PROGRAM:   Access Code: [email or web address] URL: [email or web address] Date: 06/19/2021 Prepared by: Daleen Bo       ASSESSMENT:   CLINICAL IMPRESSION: Pt with increased overall stiffness and pain at today's session that pt likely attributes to increasing daily activity as well as RA exacerbation. Pt session used today to reduce hip joint soreness and stiffness as well as C/S pain with rotation. Pt had no pain following TPDN as well as reduction of stiffness within hip after S/L extension mobs. Pt advised to continue mobility exercises for the C/S as well as optimizing recovery for the L hip. Pt to perform HEP as able to progress L hip strength. Plan to progress exercise as able, especially as pt trials new RA meds. Pt would benefit from continued skilled therapy in order to reach goals and maximize functional bilat LE  strength and ROM for full return to PLOF.    REHAB POTENTIAL: Good   CLINICAL DECISION MAKING: Stable/uncomplicated   EVALUATION COMPLEXITY: Low     GOALS:     SHORT TERM GOALS:   STG Name Target Date Goal status  1 Pt will become independent with HEP in order to demonstrate synthesis of PT education.   09/15/2021  MET  2 Pt will be able to demonstrate ability to perform full AROM of L LE without assistance LE function for self-care and house hold duties.   10/13/2021  ongoing  3 Pt will be able to demonstrate ability to perform stairs and gait with no AD/UE and equal WB in order to demonstrate functional improvement in L LE function for self-care and house hold  duties.     07/20/2021 ongoing  4 Pt will have an at least 9 pt improvement in LEFS measure in order to demonstrate MCID improvement in daily function.   07/20/2021 MET    LONG TERM GOALS:    LTG Name Target Date Goal status  1 Pt  will become independent with final HEP in order to demonstrate synthesis of PT education.   11/24/2021  ongoing  2 Pt will be able to demonstrate full floor to stand, kneeling to stand, and stand to kneeling transfer without UE support in order to demonstrate functional improvement in L LE function for ADL/house hold duties. 08/31/2021 ongoing  3 Pt will be able to demonstrate DL squat with >/=15 lbs in order to demonstrate  functional improvement in bilat LE strength for return to PLOF and exercise.   08/31/2021 ongoing  4 Pt will have an at least 18 pt improvement in LEFS measure in order to demonstrate MCID improvement in daily function.     08/31/2021 ongoing    PLAN: PT FREQUENCY: 1-2x/week   PT DURATION: 10 weeks   PLANNED INTERVENTIONS: Therapeutic exercises, Therapeutic activity, Neuro Muscular re-education, Balance training, Gait training, Patient/Family education, Joint mobilization, Stair training, Orthotic/Fit training, Aquatic Therapy, Dry Needling, Electrical stimulation, Spinal mobilization, Cryotherapy, Moist heat, scar mobilization, Taping, Vasopneumatic device, Ultrasound, Ionotophoresis 4mg /ml Dexamethasone, and Manual therapy   PLAN FOR NEXT SESSION: progress SL stability and add SL stability to HEP  Daleen Bo PT, DPT 09/01/21 10:12 AM   Patient name: NEREA BORDENAVE MRN: [id number] DOB: 1976/05/17

## 2021-09-15 ENCOUNTER — Ambulatory Visit (HOSPITAL_BASED_OUTPATIENT_CLINIC_OR_DEPARTMENT_OTHER): Payer: BC Managed Care – PPO | Admitting: Physical Therapy

## 2021-09-15 ENCOUNTER — Encounter (HOSPITAL_BASED_OUTPATIENT_CLINIC_OR_DEPARTMENT_OTHER): Payer: Self-pay | Admitting: Physical Therapy

## 2021-09-15 ENCOUNTER — Other Ambulatory Visit: Payer: Self-pay

## 2021-09-15 DIAGNOSIS — M25561 Pain in right knee: Secondary | ICD-10-CM

## 2021-09-15 DIAGNOSIS — M25552 Pain in left hip: Secondary | ICD-10-CM

## 2021-09-15 DIAGNOSIS — M6281 Muscle weakness (generalized): Secondary | ICD-10-CM

## 2021-09-15 DIAGNOSIS — M542 Cervicalgia: Secondary | ICD-10-CM

## 2021-09-15 DIAGNOSIS — R262 Difficulty in walking, not elsewhere classified: Secondary | ICD-10-CM

## 2021-09-15 NOTE — Therapy (Signed)
OUTPATIENT PHYSICAL THERAPY TREATMENT   Patient Name: Linda Gill MRN: [id number] DOB:Apr 02, 1976, 46 y.o., female Today's Date: 09/15/2021  PCP: Tracey Harries, MD REFERRING PROVIDER: Merton Border, MD   PT End of Session - 09/15/21 1021     Visit Number 22    Number of Visits 35    Date for PT Re-Evaluation 11/23/21    Authorization Type BCBS    PT Start Time 1018    PT Stop Time 1100    PT Time Calculation (min) 42 min    Equipment Utilized During Treatment Other (comment)   crutches and brace   Activity Tolerance Patient tolerated treatment well;Patient limited by pain    Behavior During Therapy WFL for tasks assessed/performed                      Past Medical History:  Diagnosis Date   Arthritis    RA   Depression    Past Surgical History:  Procedure Laterality Date   BREAST MASS EXCISION     HIP ARTHROSCOPY Left 06/05/2021   Procedure: left hip arthroscopy with labral repair and cam debridement;  Surgeon: [name], MD;  Location: [facility] OR;  Service: Orthopedics;  Laterality: Left;   Patient Active Problem List   Diagnosis Date Noted   Pulmonary embolism (HCC) 06/20/2021   DVT (deep venous thrombosis) (HCC) 06/20/2021     ONSET DATE: 06/05/2021 L HIP ARTHROSCOPY WITH LABRAL REPAIR, L CAM DEBRIDEMENT, L PINCER DEBRIDEMENT   REFERRING DIAG: M25.552 (ICD-10-CM) - Left hip pain M17.11 (ICD-10-CM) - Patellofemoral arthritis of right knee   10/17 Surgery date   THERAPY DIAG:  Pain in joint of right knee   Pain in left hip   Muscle weakness (generalized)   Difficulty walking   SUBJECTIVE:    SUBJECTIVE STATEMENT:   Pt states the new RA meds have made a huge difference. Her hip feels much better and she is doing much better. Pt states she still has some lateral hip pinching pain with walking at terminal stance. She feels she is moving much better. She is gonna be traveling to [place] for the next week.   Pt states neck just feels stiff  at this point.    PERTINENT HISTORY: RA, L labral tear   PAIN:  Are you having pain? no VAS scale: 0/10 in the R knee, 0/10 into L hip Pain orientation: Right and Left  PAIN TYPE: throbbing, aching Pain description: constant  Aggravating factors: postural Relieving factors: resting     THERAPY DIAG:  Pain in joint of right knee  Muscle weakness (generalized)  Pain in left hip  Difficulty walking  Cervicalgia  PERTINENT HISTORY: PERTINENT HISTORY: RA, L labral tear   PRECAUTIONS: None    OBJECTIVE:     TREATMENT: 1/13  L hip Mulligan belt grade IV lateral  and inf hip mob in 90 deg, MWM IR and ER grade IV ; S/L extension mob grade III   Kneeling hip flexor self mob green thick band 3 mins  Staggered stance RDL 16lb KB 3x10 Staggered stance squat with TRX 4x8 SLS with star reach 5x Attempted split squat- 5x no longer with pain    1/13  L hip Mulligan belt grade IV lateral hip mob in 90 deg, inf flexion mob grade III; S/L extension mob grade III   C2-6 UPA and CPA grade III TPDN C/S paraspinals C3-6; skilled palpation and monitoring of soft tissue response throughout session  L C/S  Paraspinals STM   1/10  L hip Mulligan belt grade IV lateral hip mob in 90 deg, inf flexion mob grade III Piriformis stretch 30s 3x Fig 4 2x10 for pain modulation Standing lunge fwd and lateral 10x Step up 8" 2x10- fwd and lateral Standing hip ABD taps 3x10 each SLS 30s 4x  Attempted split squat- too painful in to the L glute Hip thruster no weight 3x10 - L foot in rear  1/6  L hip Mulligan belt grade III lateral hip mob in 90 deg, inf flexion mob grade III Piriformis stretch 30s 3x Bridge 10x for pain modulation Standing lunge fwd and lateral 2x10 SLS 30s 4x   12/29  C2-6 UPA and CPA grade III TPDN C/S paraspinals C3-6; skilled palpation and monitoring of soft tissue response throughout session  Paraspinals STM bilat  L hip Mulligan belt grade III lateral hip  mob in 90 deg, inf flexion mob grade III Piriformis stretch 30s 3x Fig 4 bridge 10x Standing lunge 10x Review of HEP and updates  12/27  Subocc release C2-6 UPA and CPA grade III TPDN C/S paraspinals C3-6; skilled palpation and monitoring of soft tissue response throughout session  L quad STM L hip Mulligan belt grade III lateral hip mob in 90 deg, inf flexion mob grade III     PATIENT EDUCATION:  Education details: exercise progression, DN DOMS  expectations, recovery times, post-surgical progression Person educated: Patient Education method: Explanation, Demonstration, Tactile cues, Verbal cues, and Handouts Education comprehension: verbalized understanding, returned demonstration, verbal cues required, and tactile cues required     HOME EXERCISE PROGRAM:   Access Code: [email or web address] URL: [email or web address] Date: 06/19/2021 Prepared by: Daleen Bo       ASSESSMENT:   CLINICAL IMPRESSION: Pt able to continue with progression of exercise today as she has decreased pain with new RA meds with limited with end range hip extension and improvement following manual mob and self mob techniques. Pt able to progress L LE biased strengthening and balance exercise without increased pain. Pt does feel increased fatigue following session but no adverse responses. Pt to travel for 1 wk and plan to return to PT. Pt cleared to perform gym based exercises done here in clinic at home. Pt would benefit from continued skilled therapy in order to reach goals and maximize functional bilat LE  strength and ROM for full return to PLOF.    REHAB POTENTIAL: Good   CLINICAL DECISION MAKING: Stable/uncomplicated   EVALUATION COMPLEXITY: Low     GOALS:     SHORT TERM GOALS:   STG Name Target Date Goal status  1 Pt will become independent with HEP in order to demonstrate synthesis of PT education.   09/29/2021  MET  2 Pt will be able to demonstrate ability to perform full AROM of L  LE without assistance LE function for self-care and house hold duties.   10/27/2021  ongoing  3 Pt will be able to demonstrate ability to perform stairs and gait with no AD/UE and equal WB in order to demonstrate functional improvement in L LE function for self-care and house hold duties.     07/20/2021 ongoing  4 Pt will have an at least 9 pt improvement in LEFS measure in order to demonstrate MCID improvement in daily function.   07/20/2021 MET    LONG TERM GOALS:    LTG Name Target Date Goal status  1 Pt  will become independent with final HEP in order to demonstrate synthesis of PT  education.   12/08/2021  ongoing  2 Pt will be able to demonstrate full floor to stand, kneeling to stand, and stand to kneeling transfer without UE support in order to demonstrate functional improvement in L LE function for ADL/house hold duties. 08/31/2021 ongoing  3 Pt will be able to demonstrate DL squat with >/=15 lbs in order to demonstrate functional improvement in bilat LE strength for return to PLOF and exercise.   08/31/2021 ongoing  4 Pt will have an at least 18 pt improvement in LEFS measure in order to demonstrate MCID improvement in daily function.     08/31/2021 ongoing    PLAN: PT FREQUENCY: 1-2x/week   PT DURATION: 10 weeks   PLANNED INTERVENTIONS: Therapeutic exercises, Therapeutic activity, Neuro Muscular re-education, Balance training, Gait training, Patient/Family education, Joint mobilization, Stair training, Orthotic/Fit training, Aquatic Therapy, Dry Needling, Electrical stimulation, Spinal mobilization, Cryotherapy, Moist heat, scar mobilization, Taping, Vasopneumatic device, Ultrasound, Ionotophoresis 4mg /ml Dexamethasone, and Manual therapy   PLAN FOR NEXT SESSION: progress SL stability and add SL stability to HEP  Daleen Bo PT, DPT 09/15/21 11:01 AM   Patient name: CRYSTLE CARELLI MRN: [id number] DOB: [birth year]

## 2021-09-22 ENCOUNTER — Encounter (HOSPITAL_BASED_OUTPATIENT_CLINIC_OR_DEPARTMENT_OTHER): Payer: BC Managed Care – PPO | Admitting: Physical Therapy

## 2021-10-06 ENCOUNTER — Ambulatory Visit (HOSPITAL_BASED_OUTPATIENT_CLINIC_OR_DEPARTMENT_OTHER): Payer: BC Managed Care – PPO | Admitting: Physical Therapy

## 2021-10-06 NOTE — Therapy (Incomplete)
OUTPATIENT PHYSICAL THERAPY TREATMENT   Patient Name: Linda Gill MRN: [id number] DOB:1975/09/22, 46 y.o., female Today's Date: 10/06/2021  PCP: Bernerd Limbo, MD REFERRING PROVIDER: Crosby Oyster, MD              Past Medical History:  Diagnosis Date   Arthritis    RA   Depression    Past Surgical History:  Procedure Laterality Date   BREAST MASS EXCISION     HIP ARTHROSCOPY Left 06/05/2021   Procedure: left hip arthroscopy with labral repair and cam debridement;  Surgeon: Vanetta Mulders, MD;  Location: [place];  Service: Orthopedics;  Laterality: Left;   Patient Active Problem List   Diagnosis Date Noted   Pulmonary embolism ([place]) 06/20/2021   DVT (deep venous thrombosis) ([place]) 06/20/2021     ONSET DATE: 06/05/2021 L HIP ARTHROSCOPY WITH LABRAL REPAIR, L CAM DEBRIDEMENT, L PINCER DEBRIDEMENT   REFERRING DIAG: M25.552 (ICD-10-CM) - Left hip pain M17.11 (ICD-10-CM) - Patellofemoral arthritis of right knee   10/17 Surgery date   THERAPY DIAG:  Pain in joint of right knee   Pain in left hip   Muscle weakness (generalized)   Difficulty walking   SUBJECTIVE:    SUBJECTIVE STATEMENT:   Pt states the new RA meds have made a huge difference. Her hip feels much better and she is doing much better. Pt states she still has some lateral hip pinching pain with walking at terminal stance. She feels she is moving much better. She is gonna be traveling to [place] for the next week.   Pt states neck just feels stiff at this point.    PERTINENT HISTORY: RA, L labral tear   PAIN:  Are you having pain? no VAS scale: 0/10 in the R knee, 0/10 into L hip Pain orientation: Right and Left  PAIN TYPE: throbbing, aching Pain description: constant  Aggravating factors: postural Relieving factors: resting     THERAPY DIAG:  No diagnosis found.  PERTINENT HISTORY: PERTINENT HISTORY: RA, L labral tear   PRECAUTIONS: None    OBJECTIVE:      TREATMENT: 1/13  L hip Mulligan belt grade IV lateral  and inf hip mob in 90 deg, MWM IR and ER grade IV ; S/L extension mob grade III   Kneeling hip flexor self mob green thick band 3 mins  Staggered stance RDL 16lb KB 3x10 Staggered stance squat with TRX 4x8 SLS with star reach 5x Attempted split squat- 5x no longer with pain    1/13  L hip Mulligan belt grade IV lateral hip mob in 90 deg, inf flexion mob grade III; S/L extension mob grade III   C2-6 UPA and CPA grade III TPDN C/S paraspinals C3-6; skilled palpation and monitoring of soft tissue response throughout session  L C/S Paraspinals STM   1/10  L hip Mulligan belt grade IV lateral hip mob in 90 deg, inf flexion mob grade III Piriformis stretch 30s 3x Fig 4 2x10 for pain modulation Standing lunge fwd and lateral 10x Step up 8" 2x10- fwd and lateral Standing hip ABD taps 3x10 each SLS 30s 4x  Attempted split squat- too painful in to the L glute Hip thruster no weight 3x10 - L foot in rear  1/6  L hip Mulligan belt grade III lateral hip mob in 90 deg, inf flexion mob grade III Piriformis stretch 30s 3x Bridge 10x for pain modulation Standing lunge fwd and lateral 2x10 SLS 30s 4x   12/29  C2-6 UPA  and CPA grade III TPDN C/S paraspinals C3-6; skilled palpation and monitoring of soft tissue response throughout session  Paraspinals STM bilat  L hip Mulligan belt grade III lateral hip mob in 90 deg, inf flexion mob grade III Piriformis stretch 30s 3x Fig 4 bridge 10x Standing lunge 10x Review of HEP and updates  12/27  Subocc release C2-6 UPA and CPA grade III TPDN C/S paraspinals C3-6; skilled palpation and monitoring of soft tissue response throughout session  L quad STM L hip Mulligan belt grade III lateral hip mob in 90 deg, inf flexion mob grade III     PATIENT EDUCATION:  Education details: exercise progression, DN DOMS  expectations, recovery times, post-surgical progression Person  educated: Patient Education method: Explanation, Demonstration, Tactile cues, Verbal cues, and Handouts Education comprehension: verbalized understanding, returned demonstration, verbal cues required, and tactile cues required     HOME EXERCISE PROGRAM:   Access Code: [email or web address] URL: [email or web address] Date: 06/19/2021 Prepared by: Daleen Bo       ASSESSMENT:   CLINICAL IMPRESSION: Pt able to continue with progression of exercise today as she has decreased pain with new RA meds with limited with end range hip extension and improvement following manual mob and self mob techniques. Pt able to progress L LE biased strengthening and balance exercise without increased pain. Pt does feel increased fatigue following session but no adverse responses. Pt to travel for 1 wk and plan to return to PT. Pt cleared to perform gym based exercises done here in clinic at home. Pt would benefit from continued skilled therapy in order to reach goals and maximize functional bilat LE  strength and ROM for full return to PLOF.    REHAB POTENTIAL: Good   CLINICAL DECISION MAKING: Stable/uncomplicated   EVALUATION COMPLEXITY: Low     GOALS:     SHORT TERM GOALS:   STG Name Target Date Goal status  1 Pt will become independent with HEP in order to demonstrate synthesis of PT education.   10/20/2021  MET  2 Pt will be able to demonstrate ability to perform full AROM of L LE without assistance LE function for self-care and house hold duties.   11/17/2021  ongoing  3 Pt will be able to demonstrate ability to perform stairs and gait with no AD/UE and equal WB in order to demonstrate functional improvement in L LE function for self-care and house hold duties.     07/20/2021 ongoing  4 Pt will have an at least 9 pt improvement in LEFS measure in order to demonstrate MCID improvement in daily function.   07/20/2021 MET    LONG TERM GOALS:    LTG Name Target Date Goal status  1 Pt  will  become independent with final HEP in order to demonstrate synthesis of PT education.   12/29/2021  ongoing  2 Pt will be able to demonstrate full floor to stand, kneeling to stand, and stand to kneeling transfer without UE support in order to demonstrate functional improvement in L LE function for ADL/house hold duties. 08/31/2021 ongoing  3 Pt will be able to demonstrate DL squat with >/=15 lbs in order to demonstrate functional improvement in bilat LE strength for return to PLOF and exercise.   08/31/2021 ongoing  4 Pt will have an at least 18 pt improvement in LEFS measure in order to demonstrate MCID improvement in daily function.     08/31/2021 ongoing    PLAN: PT FREQUENCY: 1-2x/week   PT DURATION: 10 weeks  PLANNED INTERVENTIONS: Therapeutic exercises, Therapeutic activity, Neuro Muscular re-education, Balance training, Gait training, Patient/Family education, Joint mobilization, Stair training, Orthotic/Fit training, Aquatic Therapy, Dry Needling, Electrical stimulation, Spinal mobilization, Cryotherapy, Moist heat, scar mobilization, Taping, Vasopneumatic device, Ultrasound, Ionotophoresis 40m/ml Dexamethasone, and Manual therapy   PLAN FOR NEXT SESSION: progress SL stability and add SL stability to HEP  ADaleen BoPT, DPT 10/06/21 7:58 AM   Patient name: Linda LAUGHERYMRN: [id number]: [birth year]

## 2021-10-09 ENCOUNTER — Ambulatory Visit (INDEPENDENT_AMBULATORY_CARE_PROVIDER_SITE_OTHER): Payer: BC Managed Care – PPO | Admitting: Orthopaedic Surgery

## 2021-10-09 ENCOUNTER — Ambulatory Visit (HOSPITAL_BASED_OUTPATIENT_CLINIC_OR_DEPARTMENT_OTHER)
Admission: RE | Admit: 2021-10-09 | Discharge: 2021-10-09 | Disposition: A | Discharge status: Home / Self Care | Payer: BC Managed Care – PPO | Source: Ambulatory Visit | Attending: Orthopaedic Surgery | Admitting: Orthopaedic Surgery

## 2021-10-09 ENCOUNTER — Other Ambulatory Visit: Payer: Self-pay

## 2021-10-09 DIAGNOSIS — S73192A Other sprain of left hip, initial encounter: Secondary | ICD-10-CM

## 2021-10-09 DIAGNOSIS — M25571 Pain in right ankle and joints of right foot: Secondary | ICD-10-CM | POA: Insufficient documentation

## 2021-10-09 DIAGNOSIS — M222X1 Patellofemoral disorders, right knee: Secondary | ICD-10-CM

## 2021-10-09 IMAGING — DX DG FOOT COMPLETE 3+V*R*
4 series · 4 of 4 positions shown · non-contrast
Comparison: None.

CLINICAL DATA: Right foot and ankle pain.

EXAM:
RIGHT FOOT COMPLETE - 3+ VIEW

[foot obl]
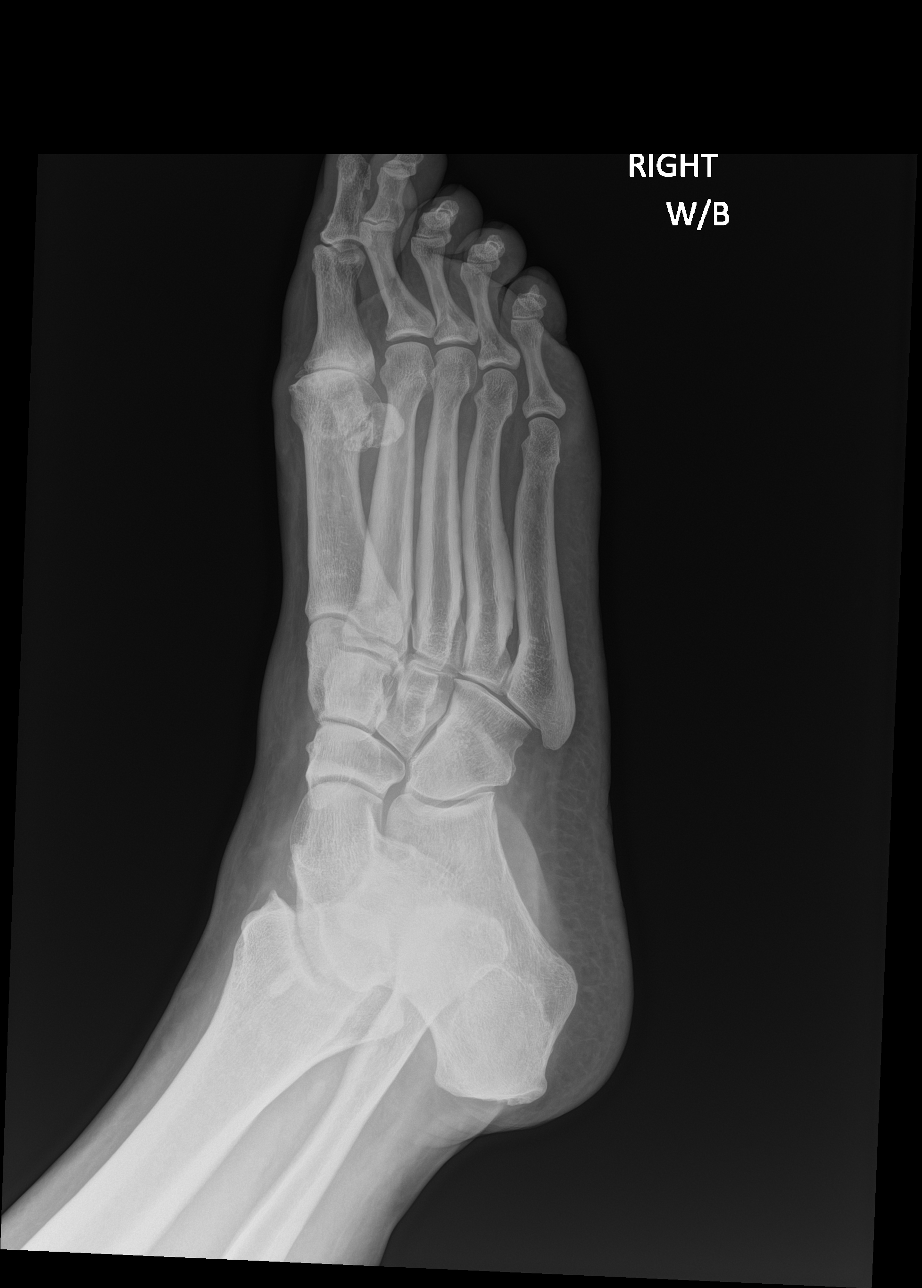

[foot lat (1 of 2)]
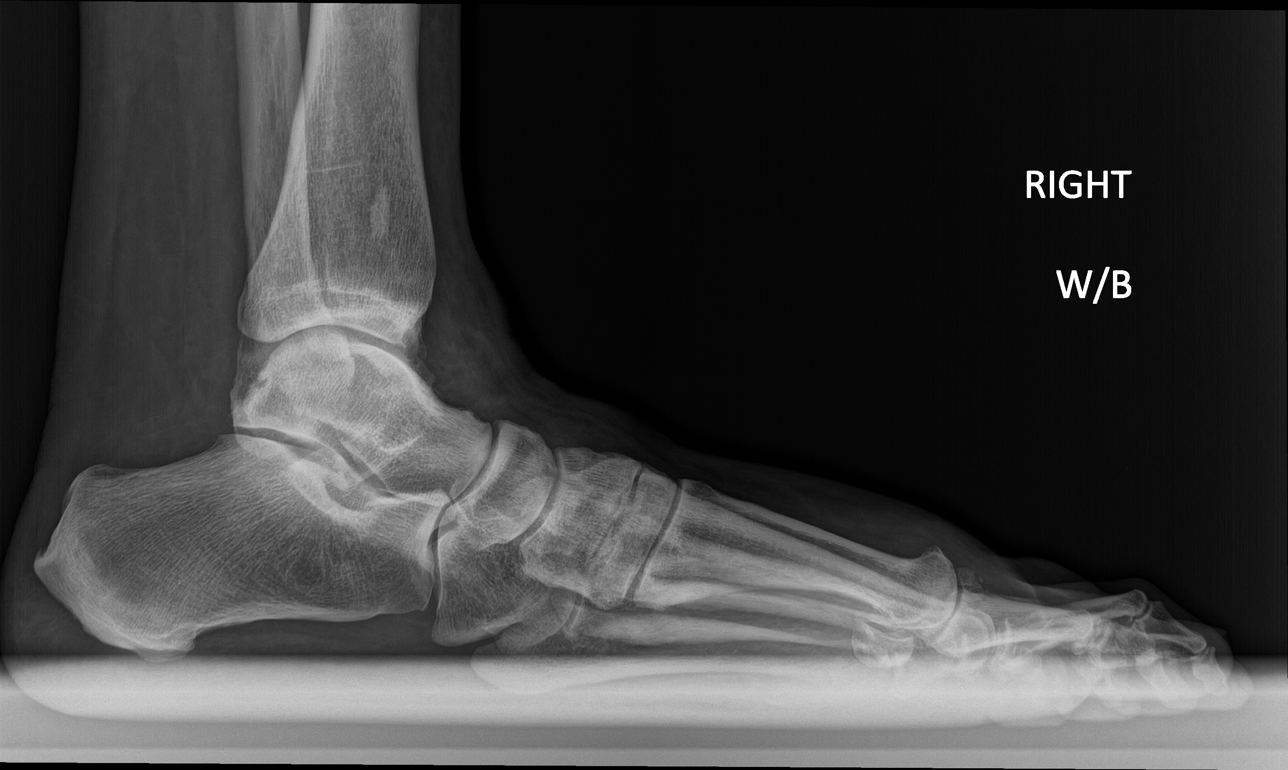

[foot ap]
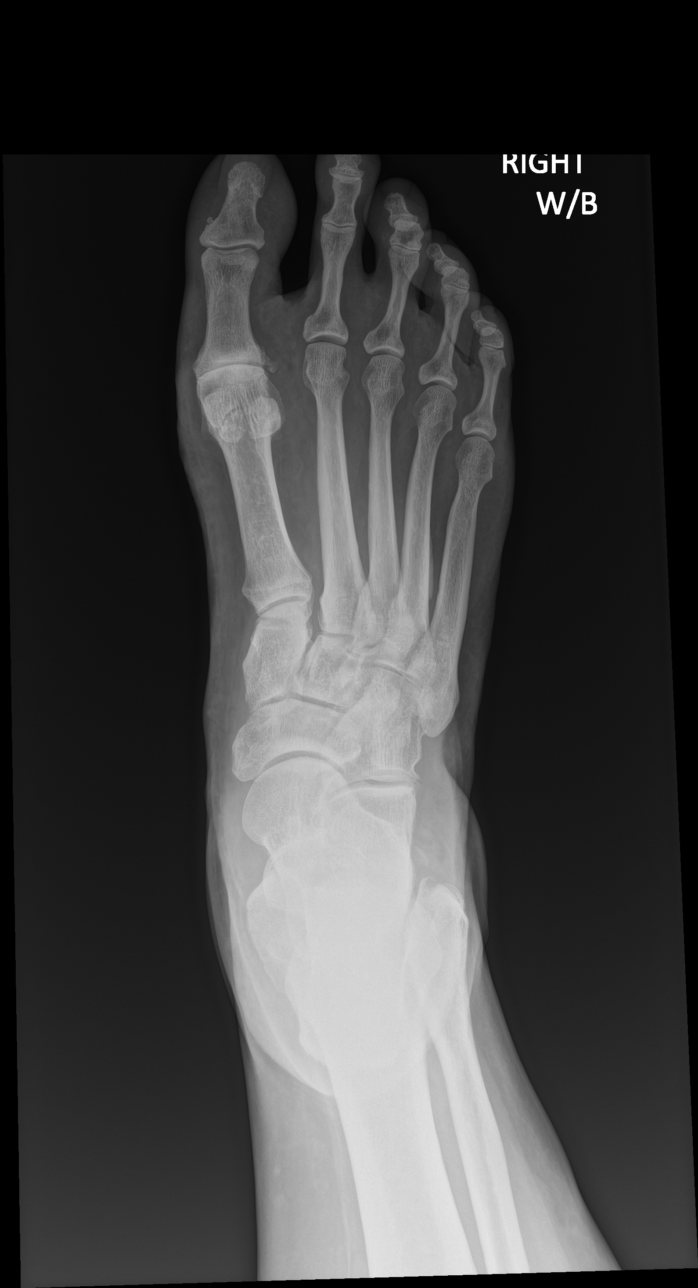

[foot lat (2 of 2)]
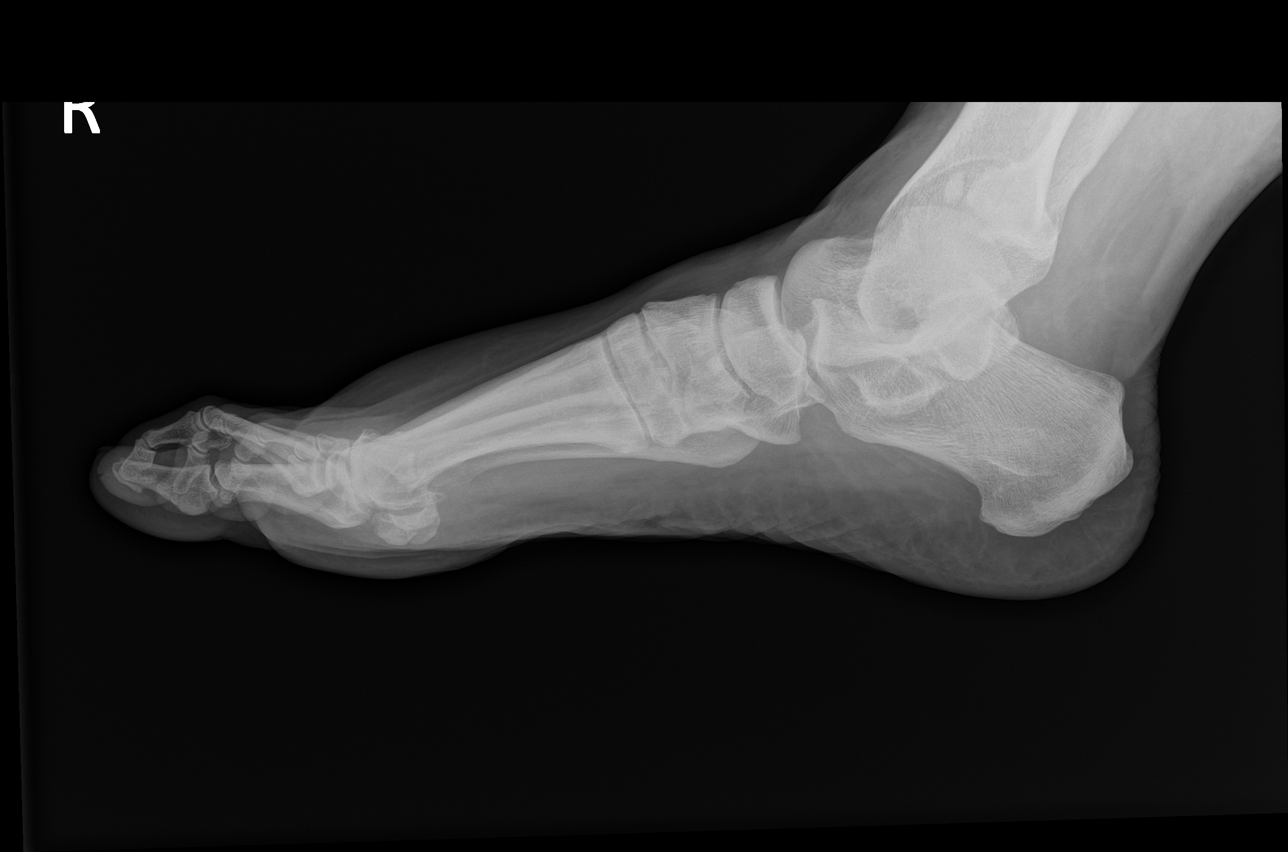

[4 of 4 positions shown; findings below may reference images not displayed]

FINDINGS: Bone mineralization is subjectively normal. There there are
hammertoe deformities of the digits. Slight pes planus. Moderate
osteoarthritis of the first metatarsal phalangeal joint with joint
space narrowing, osteophytes, and subchondral sclerosis. Minimal
degenerative spurring at the calcaneal cuboid. No erosion or
periostitis. No fracture. There is bone island in the distal tibia.
No suspicious bone lesion. Unremarkable soft tissues.
IMPRESSION: 1. Moderate first metatarsophalangeal joint osteoarthritis.
2. Hammertoe deformities of the digits.

## 2021-10-09 NOTE — Progress Notes (Signed)
phy

## 2021-10-09 NOTE — Progress Notes (Signed)
Post Operative Evaluation    Procedure/Date of Surgery: 06/05/21 -left hip arthroscopy with pincer and cam debridement  Interval History:   Linda Gill presents today for follow-up of her left hip arthroscopy.  Overall the left hip feels dramatically better.  There is still some soreness and tightness although she is very pleased with the quality of how this is doing.  She has been able to walk her dogs for longer distances which she is very happy with.  Her primary issues at today's visit are her right foot and right knee.  She states that the right knee is persistently bothering her although her right foot feels like it is grinding and shifting and is painful on the outside of the foot.  This has been going on for several years.  She has known hammertoes.  PMH/PSH/Family History/Social History/Meds/Allergies:    Past Medical History:  Diagnosis Date   Arthritis    RA   Depression    Past Surgical History:  Procedure Laterality Date   BREAST MASS EXCISION     HIP ARTHROSCOPY Left 06/05/2021   Procedure: left hip arthroscopy with labral repair and cam debridement;  Surgeon: [name], MD;  Location: [facility] OR;  Service: Orthopedics;  Laterality: Left;   Social History   Socioeconomic History   Marital status: Married    Spouse name: Not on file   Number of children: Not on file   Years of education: Not on file   Highest education level: Not on file  Occupational History   Not on file  Tobacco Use   Smoking status: Never   Smokeless tobacco: Never  Vaping Use   Vaping Use: Never used  Substance and Sexual Activity   Alcohol use: Not Currently   Drug use: Never   Sexual activity: Not on file  Other Topics Concern   Not on file  Social History Narrative   Not on file   Social Determinants of Health   Financial Resource Strain: Not on file  Food Insecurity: Not on file  Transportation Needs: Not on file  Physical Activity: Not on  file  Stress: Not on file  Social Connections: Not on file   Family History  Problem Relation Age of Onset   CAD Father    Diabetes Father    Clotting disorder Neg Hx    Allergies  Allergen Reactions   Moxifloxacin Rash   Penicillins Rash    Tolerated Ancef 2g IV on 06/05/2021   Shellfish Allergy Rash    Not allergic to iodine   Sulfa Antibiotics Rash   Current Outpatient Medications  Medication Sig Dispense Refill   APIXABAN (ELIQUIS) VTE STARTER PACK (10MG  AND 5MG ) Use as directed on package. 74 each 0   apixaban (ELIQUIS) 5 MG TABS tablet Take 1 tablet (5 mg total) by mouth 2 (two) times daily. 60 tablet 0   Ascorbic Acid (VITAMIN C) 1000 MG tablet Take 1,000 mg by mouth daily.     Cholecalciferol (VITAMIN D3 PO) Take 7,000 Units by mouth daily.     FLUoxetine (PROZAC) 20 MG capsule Take 20 mg by mouth daily.     montelukast (SINGULAIR) 10 MG tablet Take 10 mg by mouth daily.     oxyCODONE (OXY IR/ROXICODONE) 5 MG immediate release tablet Take 1 tablet (5 mg total) by mouth  every 4 (four) hours as needed for severe pain. (Patient not taking: Reported on 06/20/2021) 30 tablet 0   oxycodone (OXY-IR) 5 MG capsule Take 1 capsule (5 mg total) by mouth every 4 (four) hours as needed (severe pain). (Patient not taking: Reported on 06/20/2021) 20 capsule 0   vitamin B-12 (CYANOCOBALAMIN) 1000 MCG tablet Take 2,000 mcg by mouth daily.     vitamin E 180 MG (400 UNITS) capsule Take 400 Units by mouth daily.     zinc gluconate 50 MG tablet Take 50 mg by mouth daily.     No current facility-administered medications for this visit.   No results found.  Review of Systems:   A ROS was performed including pertinent positives and negatives as documented in the HPI.   Musculoskeletal Exam:    There were no vitals taken for this visit.  Left hip range of motion 100 degrees flexion, some soreness in flexion and internal rotation about the abductor muscles.  There is moderate gluteus weakness  with active abduction laying on the right side with the left side although this is dramatically improved  Right knee tenderness palpation about the patellofemoral joint.  There is range of motion from 0 to 140 degrees.  There is trace crepitus.  Right foot with dynamic standing pes planus.  Right great toe MTP pain as well as second and third hammertoes    Imaging:    none  I personally reviewed and interpreted the radiographs.   Assessment:   46 year old female doing overall very well status post left hip arthroscopy.  Today her right foot is more concerning to her.  At this time I would like to obtain radiographs of the right foot and plan for referral to Dr. Victorino Dike for discussion of her flatfoot deformity as well as hammertoes.  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  With regard to her right knee she does have patellofemoral osteoarthritis.  I would like her to work with physical therapy on the right knee to improve her patellar tracking which I believe will be the primary way to improve her pain.  She was counseled on a knee brace.  I would also recommend injectables and she would like to consider PRP injection once she is been able to establish improved tracking of the patella joint Plan :    -Return to clinic in 2 months    I personally saw and evaluated the patient, and participated in the management and treatment plan.  [name], MD Attending Physician, Orthopedic Surgery  This document was dictated using Dragon voice recognition software. A reasonable attempt at proof reading has been made to minimize errors.

## 2021-10-11 ENCOUNTER — Other Ambulatory Visit: Payer: Self-pay

## 2021-10-11 ENCOUNTER — Ambulatory Visit (HOSPITAL_BASED_OUTPATIENT_CLINIC_OR_DEPARTMENT_OTHER): Payer: BC Managed Care – PPO | Attending: Orthopaedic Surgery | Admitting: Physical Therapy

## 2021-10-11 ENCOUNTER — Encounter (HOSPITAL_BASED_OUTPATIENT_CLINIC_OR_DEPARTMENT_OTHER): Payer: Self-pay | Admitting: Physical Therapy

## 2021-10-11 DIAGNOSIS — R262 Difficulty in walking, not elsewhere classified: Secondary | ICD-10-CM | POA: Insufficient documentation

## 2021-10-11 DIAGNOSIS — M542 Cervicalgia: Secondary | ICD-10-CM | POA: Diagnosis present

## 2021-10-11 DIAGNOSIS — M25552 Pain in left hip: Secondary | ICD-10-CM | POA: Diagnosis present

## 2021-10-11 DIAGNOSIS — M6281 Muscle weakness (generalized): Secondary | ICD-10-CM | POA: Insufficient documentation

## 2021-10-11 DIAGNOSIS — M25561 Pain in right knee: Secondary | ICD-10-CM | POA: Diagnosis not present

## 2021-10-11 NOTE — Therapy (Signed)
OUTPATIENT PHYSICAL PROGRESS NOTE    Patient Name: Linda Gill MRN: [id number] DOB:22-Apr-1976, 46 y.o., female Today's Date: 10/11/2021  PCP: Tracey Harries, MD REFERRING PROVIDER: Merton Border, MD   PT End of Session - 10/11/21 1055     Visit Number 23    Number of Visits 35    Date for PT Re-Evaluation 11/23/21    Authorization Type [employer]    PT Start Time 1015    PT Stop Time 1055    PT Time Calculation (min) 40 min    Equipment Utilized During Treatment Other (comment)   crutches and brace   Activity Tolerance Patient tolerated treatment well;Patient limited by pain    Behavior During Therapy WFL for tasks assessed/performed                       Past Medical History:  Diagnosis Date   Arthritis    RA   Depression    Past Surgical History:  Procedure Laterality Date   BREAST MASS EXCISION     HIP ARTHROSCOPY Left 06/05/2021   Procedure: left hip arthroscopy with labral repair and cam debridement;  Surgeon: [name], MD;  Location: [facility] OR;  Service: Orthopedics;  Laterality: Left;   Patient Active Problem List   Diagnosis Date Noted   Pulmonary embolism ([facility]) 06/20/2021   DVT (deep venous thrombosis) ([facility]) 06/20/2021     ONSET DATE: 06/05/2021 L HIP ARTHROSCOPY WITH LABRAL REPAIR, L CAM DEBRIDEMENT, L PINCER DEBRIDEMENT   REFERRING DIAG: M25.552 (ICD-10-CM) - Left hip pain M17.11 (ICD-10-CM) - Patellofemoral arthritis of right knee   10/17 Surgery date   THERAPY DIAG:  Pain in joint of right knee   Pain in left hip   Muscle weakness (generalized)   Difficulty walking   SUBJECTIVE:    SUBJECTIVE STATEMENT:   Pt states the hip feels much better. Walking the dogs and going up and down hills is much better than before. She is getting better.  Her R knee and foot are currently bothering her. She is going to see a specialist about her foot. Her neck is still stiff and painful.   PERTINENT HISTORY: RA, L labral tear   PAIN:   Are you having pain? no VAS scale: 0/10 in the R knee, 0/10 into L hip Pain orientation: Right and Left  PAIN TYPE: throbbing, aching Pain description: constant  Aggravating factors: postural Relieving factors: resting     THERAPY DIAG:  Pain in joint of right knee  Muscle weakness (generalized)  Pain in left hip  Difficulty walking  Cervicalgia  PERTINENT HISTORY: PERTINENT HISTORY: RA, L labral tear   PRECAUTIONS: None    OBJECTIVE:  Previous: HHD (measured in lbs)   L hip flexion 35, ext 26.1, ABD 31.1 , ADD 20.9 , IR 21.0,  ER 18.9   R hip flexion 35, ext 35.7, ABD 36.9, ADD 35.5, IR 38.1, ER2  26.2   L hip AROM: 115 flexion, IR 20, ER 28, ABD 30 ext 10 L Hip PROM [facility]  2/22 HHD (measured in lbs)   L hip flexion 43.8, ext 48, ABD 36.4 , ADD 34.4 , IR 31.0,  ER 23.7   L hip AROM: WFL L Hip PROM [facility]     TREATMENT:  2/22  C2-6 UPA grade III prone TPDN C/S paraspinals C3-6; skilled palpation and monitoring of soft tissue response throughout session  L C/S Paraspinals STM   Butterfly stretch 30s 3x Split squat 3x  unable to perform due to R toe pain Lateral lunge 2x10 L leading no UE SLS on foam 30s 4x Lateral step up 2x10 R knee ext isometrics at 90 5s 10x  1/13  L hip Mulligan belt grade IV lateral  and inf hip mob in 90 deg, MWM IR and ER grade IV ; S/L extension mob grade III   Kneeling hip flexor self mob green thick band 3 mins  Staggered stance RDL 16lb KB 3x10 Staggered stance squat with TRX 4x8 SLS with star reach 5x Attempted split squat- 5x no longer with pain    1/13  L hip Mulligan belt grade IV lateral hip mob in 90 deg, inf flexion mob grade III; S/L extension mob grade III   C2-6 UPA and CPA grade III TPDN C/S paraspinals C3-6; skilled palpation and monitoring of soft tissue response throughout session  L C/S Paraspinals STM   1/10  L hip Mulligan belt grade IV lateral hip mob in 90 deg, inf flexion mob grade  III Piriformis stretch 30s 3x Fig 4 2x10 for pain modulation Standing lunge fwd and lateral 10x Step up 8" 2x10- fwd and lateral Standing hip ABD taps 3x10 each SLS 30s 4x  Attempted split squat- too painful in to the L glute Hip thruster no weight 3x10 - L foot in rear  1/6  L hip Mulligan belt grade III lateral hip mob in 90 deg, inf flexion mob grade III Piriformis stretch 30s 3x Bridge 10x for pain modulation Standing lunge fwd and lateral 2x10 SLS 30s 4x   12/29  C2-6 UPA and CPA grade III TPDN C/S paraspinals C3-6; skilled palpation and monitoring of soft tissue response throughout session  Paraspinals STM bilat  L hip Mulligan belt grade III lateral hip mob in 90 deg, inf flexion mob grade III Piriformis stretch 30s 3x Fig 4 bridge 10x Standing lunge 10x Review of HEP and updates  12/27  Subocc release C2-6 UPA and CPA grade III TPDN C/S paraspinals C3-6; skilled palpation and monitoring of soft tissue response throughout session  L quad STM L hip Mulligan belt grade III lateral hip mob in 90 deg, inf flexion mob grade III     PATIENT EDUCATION:  Education details: exercise progression, DN DOMS  expectations, recovery times, post-surgical progression Person educated: Patient Education method: Explanation, Demonstration, Tactile cues, Verbal cues, and Handouts Education comprehension: verbalized understanding, returned demonstration, verbal cues required, and tactile cues required     HOME EXERCISE PROGRAM:   Access Code: [email or web address] URL: [email or web address] Date: 06/19/2021 Prepared by: Daleen Bo       ASSESSMENT:   CLINICAL IMPRESSION: Pt demonstrates significant improvement in L hip strength in all planes as demo'd by HHD. Pt however has increased R LE pain at the knee and foot as well as cervical pain that is likely related to RA, stress response, and increase R LE loading during L hip recovery. Pt was able to progress SL stability of  the L with increased box height as well as pliant surface balance. Pt advised to continue with home gym based L hip strengthening at this time. Plan to continue with L hip strength as well as improving R knee loading tolerance. Pt would benefit from continued skilled therapy in order to reach goals and maximize functional bilat LE  strength and ROM for full return to PLOF.    REHAB POTENTIAL: Good   CLINICAL DECISION MAKING: Stable/uncomplicated   EVALUATION COMPLEXITY: Low     GOALS:  SHORT TERM GOALS:   STG Name Target Date Goal status  1 Pt will become independent with HEP in order to demonstrate synthesis of PT education.   10/25/2021  MET  2 Pt will be able to demonstrate ability to perform full AROM of L LE without assistance LE function for self-care and house hold duties.   11/22/2021  ongoing  3 Pt will be able to demonstrate ability to perform stairs and gait with no AD/UE and equal WB in order to demonstrate functional improvement in L LE function for self-care and house hold duties.     07/20/2021 ongoing  4 Pt will have an at least 9 pt improvement in LEFS measure in order to demonstrate MCID improvement in daily function.   07/20/2021 MET    LONG TERM GOALS:    LTG Name Target Date Goal status  1 Pt  will become independent with final HEP in order to demonstrate synthesis of PT education.   01/03/2022  ongoing  2 Pt will be able to demonstrate full floor to stand, kneeling to stand, and stand to kneeling transfer without UE support in order to demonstrate functional improvement in L LE function for ADL/house hold duties. 08/31/2021 ongoing  3 Pt will be able to demonstrate DL squat with >/=15 lbs in order to demonstrate functional improvement in bilat LE strength for return to PLOF and exercise.   08/31/2021 ongoing  4 Pt will have an at least 18 pt improvement in LEFS measure in order to demonstrate MCID improvement in daily function.     08/31/2021 ongoing     PLAN: PT FREQUENCY: 1-2x/week   PT DURATION: 10 weeks   PLANNED INTERVENTIONS: Therapeutic exercises, Therapeutic activity, Neuro Muscular re-education, Balance training, Gait training, Patient/Family education, Joint mobilization, Stair training, Orthotic/Fit training, Aquatic Therapy, Dry Needling, Electrical stimulation, Spinal mobilization, Cryotherapy, Moist heat, scar mobilization, Taping, Vasopneumatic device, Ultrasound, Ionotophoresis 4mg /ml Dexamethasone, and Manual therapy   PLAN FOR NEXT SESSION: progress SL stability and add SL stability to HEP  Daleen Bo PT, DPT 10/11/21 11:01 AM   Patient name: Linda Gill MRN: [id number] DOB: [birth year]

## 2021-10-20 ENCOUNTER — Encounter (HOSPITAL_BASED_OUTPATIENT_CLINIC_OR_DEPARTMENT_OTHER): Payer: Self-pay | Admitting: Physical Therapy

## 2021-10-20 ENCOUNTER — Other Ambulatory Visit: Payer: Self-pay

## 2021-10-20 ENCOUNTER — Ambulatory Visit (HOSPITAL_BASED_OUTPATIENT_CLINIC_OR_DEPARTMENT_OTHER): Payer: BC Managed Care – PPO | Attending: Orthopaedic Surgery | Admitting: Physical Therapy

## 2021-10-20 DIAGNOSIS — M25552 Pain in left hip: Secondary | ICD-10-CM | POA: Diagnosis present

## 2021-10-20 DIAGNOSIS — M25561 Pain in right knee: Secondary | ICD-10-CM

## 2021-10-20 DIAGNOSIS — M542 Cervicalgia: Secondary | ICD-10-CM

## 2021-10-20 DIAGNOSIS — M222X1 Patellofemoral disorders, right knee: Secondary | ICD-10-CM | POA: Insufficient documentation

## 2021-10-20 DIAGNOSIS — R262 Difficulty in walking, not elsewhere classified: Secondary | ICD-10-CM

## 2021-10-20 DIAGNOSIS — M6281 Muscle weakness (generalized): Secondary | ICD-10-CM | POA: Diagnosis present

## 2021-10-20 NOTE — Therapy (Signed)
OUTPATIENT PHYSICAL TREATMENT NOTE    Patient Name: Linda Gill MRN: [id number] DOB:1975/09/15, 46 y.o., female Today's Date: 10/20/2021  PCP: Bernerd Limbo, MD REFERRING PROVIDER: Crosby Oyster, MD   PT End of Session - 10/20/21 1019     Visit Number 24    Number of Visits 35    Date for PT Re-Evaluation 11/23/21    Authorization Type BCBS    PT Start Time 1016    PT Stop Time 1055    PT Time Calculation (min) 39 min    Equipment Utilized During Treatment Other (comment)   crutches and brace   Activity Tolerance Patient tolerated treatment well;Patient limited by pain    Behavior During Therapy WFL for tasks assessed/performed                       Past Medical History:  Diagnosis Date   Arthritis    RA   Depression    Past Surgical History:  Procedure Laterality Date   BREAST MASS EXCISION     HIP ARTHROSCOPY Left 06/05/2021   Procedure: left hip arthroscopy with labral repair and cam debridement;  Surgeon: Vanetta Mulders, MD;  Location: [facility];  Service: Orthopedics;  Laterality: Left;   Patient Active Problem List   Diagnosis Date Noted   Pulmonary embolism ([place]) 06/20/2021   DVT (deep venous thrombosis) ([place]) 06/20/2021     ONSET DATE: 06/05/2021 L HIP ARTHROSCOPY WITH LABRAL REPAIR, L CAM DEBRIDEMENT, L PINCER DEBRIDEMENT   REFERRING DIAG: M25.552 (ICD-10-CM) - Left hip pain M17.11 (ICD-10-CM) - Patellofemoral arthritis of right knee   10/17 Surgery date   THERAPY DIAG:  Pain in joint of right knee   Pain in left hip   Muscle weakness (generalized)   Difficulty walking   SUBJECTIVE:    SUBJECTIVE STATEMENT:   Pt states that her RA is flared up today. She states that her neck super is really painful today on the L. She is unable to turn her head. She states she is going to have a first toe fusion on the R foot bc of the signficant OA as well as 2nd digit hammer toe. She states that her hip feels better now.    PERTINENT  HISTORY: RA, L labral tear   PAIN:  Are you having pain? Yes VAS scale: 5/10 Pain orientation: bilateral neck PAIN TYPE: throbbing, aching Pain description: constant  Aggravating factors: turning neck Relieving factors: resting     THERAPY DIAG:  Pain in joint of right knee  Muscle weakness (generalized)  Pain in left hip  Difficulty walking  Cervicalgia  PERTINENT HISTORY: PERTINENT HISTORY: RA, L labral tear   PRECAUTIONS: None    OBJECTIVE:  Previous: HHD (measured in lbs)   L hip flexion 35, ext 26.1, ABD 31.1 , ADD 20.9 , IR 21.0,  ER 18.9   R hip flexion 35, ext 35.7, ABD 36.9, ADD 35.5, IR 38.1, ER2  26.2   L hip AROM: 115 flexion, IR 20, ER 28, ABD 30 ext 10 L Hip PROM [facility]  2/22 HHD (measured in lbs)   L hip flexion 43.8, ext 48, ABD 36.4 , ADD 34.4 , IR 31.0,  ER 23.7   L hip AROM: WFL L Hip PROM WFL     TREATMENT:  3/3   C2-6 UPA grade III prone TPDN C/S paraspinals C3-6; skilled palpation and monitoring of soft tissue response throughout session  L C/S Paraspinals STM   C2-6 CPA  and UPA bilat grade III-IV Manual traction subocc hold grade III 5 min   2/22  C2-6 UPA grade III prone TPDN C/S paraspinals C3-6; skilled palpation and monitoring of soft tissue response throughout session  L C/S Paraspinals STM   Butterfly stretch 30s 3x Split squat 3x unable to perform due to R toe pain Lateral lunge 2x10 L leading no UE SLS on foam 30s 4x Lateral step up 2x10 R knee ext isometrics at 90 5s 10x  1/13  L hip Mulligan belt grade IV lateral  and inf hip mob in 90 deg, MWM IR and ER grade IV ; S/L extension mob grade III   Kneeling hip flexor self mob green thick band 3 mins  Staggered stance RDL 16lb KB 3x10 Staggered stance squat with TRX 4x8 SLS with star reach 5x Attempted split squat- 5x no longer with pain    1/13  L hip Mulligan belt grade IV lateral hip mob in 90 deg, inf flexion mob grade III; S/L extension mob grade  III   C2-6 UPA and CPA grade III TPDN C/S paraspinals C3-6; skilled palpation and monitoring of soft tissue response throughout session  L C/S Paraspinals STM   1/10  L hip Mulligan belt grade IV lateral hip mob in 90 deg, inf flexion mob grade III Piriformis stretch 30s 3x Fig 4 2x10 for pain modulation Standing lunge fwd and lateral 10x Step up 8" 2x10- fwd and lateral Standing hip ABD taps 3x10 each SLS 30s 4x  Attempted split squat- too painful in to the L glute Hip thruster no weight 3x10 - L foot in rear  1/6  L hip Mulligan belt grade III lateral hip mob in 90 deg, inf flexion mob grade III Piriformis stretch 30s 3x Bridge 10x for pain modulation Standing lunge fwd and lateral 2x10 SLS 30s 4x   12/29  C2-6 UPA and CPA grade III TPDN C/S paraspinals C3-6; skilled palpation and monitoring of soft tissue response throughout session  Paraspinals STM bilat  L hip Mulligan belt grade III lateral hip mob in 90 deg, inf flexion mob grade III Piriformis stretch 30s 3x Fig 4 bridge 10x Standing lunge 10x Review of HEP and updates  12/27  Subocc release C2-6 UPA and CPA grade III TPDN C/S paraspinals C3-6; skilled palpation and monitoring of soft tissue response throughout session  L quad STM L hip Mulligan belt grade III lateral hip mob in 90 deg, inf flexion mob grade III     PATIENT EDUCATION:  Education details: exercise progression, DN DOMS  expectations, recovery times, post-surgical progression Person educated: Patient Education method: Explanation, Demonstration, Tactile cues, Verbal cues, and Handouts Education comprehension: verbalized understanding, returned demonstration, verbal cues required, and tactile cues required     HOME EXERCISE PROGRAM:   Access Code: [email or web address] URL: [email or web address] Date: 06/19/2021 Prepared by: Daleen Bo       ASSESSMENT:   CLINICAL IMPRESSION: Pt presents today with exacerbated neck pain and  stiffness that limits her ability for cervical rotation and general movement. Pt with significant LTR in cervcial paraspinals on R during session. Pt had improved L rotation following manual therapy. Pt responded especially well to joint mob and manual traction. Plan to eval knee at next session. Pt POC likely to be affected by upcoming surgery. Pt would benefit from continued skilled therapy in order to reach goals and maximize functional bilat LE  strength and ROM for full return to PLOF.    REHAB POTENTIAL: Good  CLINICAL DECISION MAKING: Stable/uncomplicated   EVALUATION COMPLEXITY: Low     GOALS:     SHORT TERM GOALS:   STG Name Target Date Goal status  1 Pt will become independent with HEP in order to demonstrate synthesis of PT education.   11/03/2021  MET  2 Pt will be able to demonstrate ability to perform full AROM of L LE without assistance LE function for self-care and house hold duties.   12/01/2021  ongoing  3 Pt will be able to demonstrate ability to perform stairs and gait with no AD/UE and equal WB in order to demonstrate functional improvement in L LE function for self-care and house hold duties.     07/20/2021 ongoing  4 Pt will have an at least 9 pt improvement in LEFS measure in order to demonstrate MCID improvement in daily function.   07/20/2021 MET    LONG TERM GOALS:    LTG Name Target Date Goal status  1 Pt  will become independent with final HEP in order to demonstrate synthesis of PT education.   01/12/2022  ongoing  2 Pt will be able to demonstrate full floor to stand, kneeling to stand, and stand to kneeling transfer without UE support in order to demonstrate functional improvement in L LE function for ADL/house hold duties. 08/31/2021 ongoing  3 Pt will be able to demonstrate DL squat with >/=15 lbs in order to demonstrate functional improvement in bilat LE strength for return to PLOF and exercise.   08/31/2021 ongoing  4 Pt will have an at least 18 pt  improvement in LEFS measure in order to demonstrate MCID improvement in daily function.     08/31/2021 ongoing    PLAN: PT FREQUENCY: 1-2x/week   PT DURATION: 10 weeks   PLANNED INTERVENTIONS: Therapeutic exercises, Therapeutic activity, Neuro Muscular re-education, Balance training, Gait training, Patient/Family education, Joint mobilization, Stair training, Orthotic/Fit training, Aquatic Therapy, Dry Needling, Electrical stimulation, Spinal mobilization, Cryotherapy, Moist heat, scar mobilization, Taping, Vasopneumatic device, Ultrasound, Ionotophoresis 87m/ml Dexamethasone, and Manual therapy   PLAN FOR NEXT SESSION: progress SL stability and add SL stability to HEP  ADaleen BoPT, DPT 10/20/21 10:59 AM   Patient name: Linda SALMINENMRN: [id number]: [birth year]

## 2021-10-23 ENCOUNTER — Other Ambulatory Visit (HOSPITAL_COMMUNITY): Payer: Self-pay | Admitting: Orthopedic Surgery

## 2021-10-26 ENCOUNTER — Ambulatory Visit (HOSPITAL_BASED_OUTPATIENT_CLINIC_OR_DEPARTMENT_OTHER): Payer: BC Managed Care – PPO | Admitting: Physical Therapy

## 2021-10-26 ENCOUNTER — Encounter (HOSPITAL_BASED_OUTPATIENT_CLINIC_OR_DEPARTMENT_OTHER): Payer: Self-pay | Admitting: Physical Therapy

## 2021-10-26 ENCOUNTER — Other Ambulatory Visit: Payer: Self-pay

## 2021-10-26 DIAGNOSIS — M25552 Pain in left hip: Secondary | ICD-10-CM

## 2021-10-26 DIAGNOSIS — M6281 Muscle weakness (generalized): Secondary | ICD-10-CM

## 2021-10-26 DIAGNOSIS — M25561 Pain in right knee: Secondary | ICD-10-CM | POA: Diagnosis not present

## 2021-10-26 DIAGNOSIS — R262 Difficulty in walking, not elsewhere classified: Secondary | ICD-10-CM

## 2021-10-26 DIAGNOSIS — M542 Cervicalgia: Secondary | ICD-10-CM

## 2021-10-26 NOTE — Therapy (Signed)
OUTPATIENT PHYSICAL RE-ASSESSMENT NOTE    Patient Name: Linda Gill MRN: [id number] DOB:Feb 07, 1976, 46 y.o., female Today's Date: 10/26/2021  PCP: Tracey Harries, MD REFERRING PROVIDER: Merton Border, MD   PT End of Session - 10/26/21 1433     Visit Number 25    Number of Visits 35    Date for PT Re-Evaluation 11/23/21    Authorization Type BCBS    PT Start Time 1430    PT Stop Time 1510    PT Time Calculation (min) 40 min    Equipment Utilized During Treatment Other (comment)   crutches and brace   Activity Tolerance Patient tolerated treatment well;Patient limited by pain    Behavior During Therapy WFL for tasks assessed/performed                        Past Medical History:  Diagnosis Date   Arthritis    RA   Depression    Past Surgical History:  Procedure Laterality Date   BREAST MASS EXCISION     HIP ARTHROSCOPY Left 06/05/2021   Procedure: left hip arthroscopy with labral repair and cam debridement;  Surgeon: [name], MD;  Location: [facility] OR;  Service: Orthopedics;  Laterality: Left;   Patient Active Problem List   Diagnosis Date Noted   Pulmonary embolism ([facility]) 06/20/2021   DVT (deep venous thrombosis) ([facility]) 06/20/2021     ONSET DATE: 06/05/2021 L HIP ARTHROSCOPY WITH LABRAL REPAIR, L CAM DEBRIDEMENT, L PINCER DEBRIDEMENT   REFERRING DIAG: M25.552 (ICD-10-CM) - Left hip pain M17.11 (ICD-10-CM) - Patellofemoral arthritis of right knee   10/17 Surgery date   THERAPY DIAG:  Pain in joint of right knee   Pain in left hip   Muscle weakness (generalized)   Difficulty walking   SUBJECTIVE:    SUBJECTIVE STATEMENT:   Pt states that the R knee pain hurts in general. It clicks, pops, and hurts with WB. Pt states she has lateral patellar cartilage damage. Walking the dogs hurts, stairs, and she has R foot hammer toes that causes gait deviations. Pt states that the R knee is limiting her daily mobility. Pt states that walking over a  mile with the dogs really bothered her L hip as well as her R knee. Pt states the knee will swell very often. She states that Dr. August Saucer has pulled fluid from the knee twice.  Pt does have foot surgery in April. She will be off RA meds for 3 weeks before and after.    PERTINENT HISTORY: RA, L labral tear   PAIN:  Are you having pain? No VAS scale: 0/10  PAIN TYPE: throbbing, aching Pain description: constant  Aggravating factors: turning neck Relieving factors: resting     THERAPY DIAG:  Pain in joint of right knee  Muscle weakness (generalized)  Pain in left hip  Difficulty walking  Cervicalgia  PERTINENT HISTORY: PERTINENT HISTORY: RA, L labral tear   PRECAUTIONS: None    OBJECTIVE:   IMPRESSION: 1. No meniscal or ligamentous injury of the right knee. 2. Tricompartmental cartilage abnormalities of the right knee most severe in the lateral patellofemoral compartment.   Previous: HHD (measured in lbs)   L hip flexion 35, ext 26.1, ABD 31.1 , ADD 20.9 , IR 21.0,  ER 18.9   R hip flexion 35, ext 35.7, ABD 36.9, ADD 35.5, IR 38.1, ER2  26.2   L hip AROM: 115 flexion, IR 20, ER 28, ABD 30 ext 10  L Hip PROM [facility]  2/22 HHD (measured in lbs)   L hip flexion 43.8, ext 48, ABD 36.4 , ADD 34.4 , IR 31.0,  ER 23.7   L hip AROM: WFL L Hip PROM [facility]   3/9  ROM R knee Flexion 120 deg R knee Ext 0 deg  L knee flexion 128 L knee ext 10 deg hyper  R knee Ext 56.9 lbs R knee flexion 25.7  L knee flexion 53.7 L knee ext 62.7 lb  R knee Special Tests: patellar grind (+), negative Lachman's, negative varus/valgus at 0  TREATMENT:  3/9  Exercises Seated Isometric Knee Extension - 5-6 x daily - 7 x weekly - 1 sets - 10 reps - 5 hold Long Sitting Quad Set - 5-6 x daily - 7 x weekly - 1 sets - 20 reps - 2 hold Seated Hamstring Curl with Anchored Resistance - 5-6 x daily - 7 x weekly - 3 sets - 10 reps Sitting Knee Extension with Resistance - 5-6 x daily - 7 x  weekly - 3 sets - 10 reps  3/3   C2-6 UPA grade III prone TPDN C/S paraspinals C3-6; skilled palpation and monitoring of soft tissue response throughout session  L C/S Paraspinals STM   C2-6 CPA and UPA bilat grade III-IV Manual traction subocc hold grade III 5 min   2/22  C2-6 UPA grade III prone TPDN C/S paraspinals C3-6; skilled palpation and monitoring of soft tissue response throughout session  L C/S Paraspinals STM   Butterfly stretch 30s 3x Split squat 3x unable to perform due to R toe pain Lateral lunge 2x10 L leading no UE SLS on foam 30s 4x Lateral step up 2x10 R knee ext isometrics at 90 5s 10x  1/13  L hip Mulligan belt grade IV lateral  and inf hip mob in 90 deg, MWM IR and ER grade IV ; S/L extension mob grade III   Kneeling hip flexor self mob green thick band 3 mins  Staggered stance RDL 16lb KB 3x10 Staggered stance squat with TRX 4x8 SLS with star reach 5x Attempted split squat- 5x no longer with pain    1/13  L hip Mulligan belt grade IV lateral hip mob in 90 deg, inf flexion mob grade III; S/L extension mob grade III   C2-6 UPA and CPA grade III TPDN C/S paraspinals C3-6; skilled palpation and monitoring of soft tissue response throughout session  L C/S Paraspinals STM   1/10  L hip Mulligan belt grade IV lateral hip mob in 90 deg, inf flexion mob grade III Piriformis stretch 30s 3x Fig 4 2x10 for pain modulation Standing lunge fwd and lateral 10x Step up 8" 2x10- fwd and lateral Standing hip ABD taps 3x10 each SLS 30s 4x  Attempted split squat- too painful in to the L glute Hip thruster no weight 3x10 - L foot in rear  1/6  L hip Mulligan belt grade III lateral hip mob in 90 deg, inf flexion mob grade III Piriformis stretch 30s 3x Bridge 10x for pain modulation Standing lunge fwd and lateral 2x10 SLS 30s 4x      PATIENT EDUCATION:  Education details: exercise progression, DN DOMS  expectations, recovery times, post-surgical  progression Person educated: Patient Education method: Explanation, Demonstration, Tactile cues, Verbal cues, and Handouts Education comprehension: verbalized understanding, returned demonstration, verbal cues required, and tactile cues required     HOME EXERCISE PROGRAM:   Access Code: [email or web address] URL: [email or web address] Date: 06/19/2021 Prepared by:  Daleen Bo    Access Code: [email or web address] URL: [email or web address] Date: 10/26/2021 Prepared by: Daleen Bo       ASSESSMENT:   CLINICAL IMPRESSION: Pt's R knee pain today consistent with history of patellar cartilage degeneration leading to increased edema, quad weakness, and decreased ROM. Pt's pain consistent with PFPS type pain. Pt with clinically significant differences in knee strength as compared to L which has a post surgical hip. Pt was able to tolerate gentle quad activation exercise without increased pain and advised to use natural muscle pumping to reduce swelling. Pt given edu about NMES units. Plan to proceed with L hip and R knee strength and ROM as tolerated. Pt appears to have neck pain managed at this time through massage therapy. Pt would benefit from continued skilled therapy in order to reach goals and maximize functional bilat LE  strength and ROM for full return to PLOF.    REHAB POTENTIAL: Good   CLINICAL DECISION MAKING: Stable/uncomplicated   EVALUATION COMPLEXITY: Low     GOALS:     SHORT TERM GOALS:   STG Name Target Date Goal status  1 Pt will become independent with HEP in order to demonstrate synthesis of PT education.   11/09/2021  MET  2 Pt will be able to demonstrate ability to perform full AROM of L LE without assistance LE function for self-care and house hold duties.   12/07/2021  ongoing  3 Pt will be able to demonstrate ability to perform stairs and gait with no AD/UE and equal WB in order to demonstrate functional improvement in L LE function for self-care and  house hold duties.     07/20/2021 ongoing  4 Pt will have an at least 9 pt improvement in LEFS measure in order to demonstrate MCID improvement in daily function.   07/20/2021 MET    LONG TERM GOALS:    LTG Name Target Date Goal status  1 Pt  will become independent with final HEP in order to demonstrate synthesis of PT education.   01/18/2022  ongoing  2 Pt will be able to demonstrate full floor to stand, kneeling to stand, and stand to kneeling transfer without UE support in order to demonstrate functional improvement in L LE function for ADL/house hold duties. 08/31/2021 ongoing  3 Pt will be able to demonstrate DL squat with >/=15 lbs in order to demonstrate functional improvement in bilat LE strength for return to PLOF and exercise.   08/31/2021 ongoing  4 Pt will have an at least 18 pt improvement in LEFS measure in order to demonstrate MCID improvement in daily function.     08/31/2021 ongoing    PLAN: PT FREQUENCY: 1-2x/week   PT DURATION: 10 weeks   PLANNED INTERVENTIONS: Therapeutic exercises, Therapeutic activity, Neuro Muscular re-education, Balance training, Gait training, Patient/Family education, Joint mobilization, Stair training, Orthotic/Fit training, Aquatic Therapy, Dry Needling, Electrical stimulation, Spinal mobilization, Cryotherapy, Moist heat, scar mobilization, Taping, Vasopneumatic device, Ultrasound, Ionotophoresis 4mg /ml Dexamethasone, and Manual therapy   PLAN FOR NEXT SESSION: progress SL stability and add SL stability to HEP  Daleen Bo PT, DPT 10/26/21 3:15 PM   Patient name: RHILYN BATTLE MRN: [id number] DOB: 1976-03-24

## 2021-10-27 ENCOUNTER — Encounter (HOSPITAL_BASED_OUTPATIENT_CLINIC_OR_DEPARTMENT_OTHER): Payer: BC Managed Care – PPO | Admitting: Physical Therapy

## 2021-11-13 ENCOUNTER — Encounter (HOSPITAL_BASED_OUTPATIENT_CLINIC_OR_DEPARTMENT_OTHER): Payer: Self-pay | Admitting: Physical Therapy

## 2021-11-13 ENCOUNTER — Other Ambulatory Visit: Payer: Self-pay

## 2021-11-13 ENCOUNTER — Ambulatory Visit (HOSPITAL_BASED_OUTPATIENT_CLINIC_OR_DEPARTMENT_OTHER): Payer: BC Managed Care – PPO | Admitting: Physical Therapy

## 2021-11-13 DIAGNOSIS — M25561 Pain in right knee: Secondary | ICD-10-CM | POA: Diagnosis not present

## 2021-11-13 DIAGNOSIS — M542 Cervicalgia: Secondary | ICD-10-CM

## 2021-11-13 DIAGNOSIS — R262 Difficulty in walking, not elsewhere classified: Secondary | ICD-10-CM

## 2021-11-13 DIAGNOSIS — M25552 Pain in left hip: Secondary | ICD-10-CM

## 2021-11-13 DIAGNOSIS — M6281 Muscle weakness (generalized): Secondary | ICD-10-CM

## 2021-11-13 NOTE — Therapy (Signed)
? ?OUTPATIENT PHYSICAL TREATMENT NOTE  ? ? ?Patient Name: Linda Gill ?MRN: [id number] ?DOB:1975-10-04, 46 y.o., female ?Today's Date: 11/13/2021 ? ?PCP: Bernerd Limbo, MD ?REFERRING PROVIDER: Crosby Oyster, MD ? ? PT End of Session - 11/13/21 1259   ? ? Visit Number 26   ? Number of Visits 35   ? Date for PT Re-Evaluation 11/23/21   ? Authorization Type BCBS   ? PT Start Time 1300   ? PT Stop Time 1319   ? PT Time Calculation (min) 19 min   ? Equipment Utilized During Treatment Other (comment)   crutches and brace  ? Activity Tolerance Patient tolerated treatment well;Patient limited by pain   ? Behavior During Therapy [facility] for tasks assessed/performed   ? ?  ?  ? ?  ? ? ? ? ? ?Past Medical History:  ?Diagnosis Date  ? Arthritis   ? RA  ? Depression   ? ?Past Surgical History:  ?Procedure Laterality Date  ? BREAST MASS EXCISION    ? HIP ARTHROSCOPY Left 06/05/2021  ? Procedure: left hip arthroscopy with labral repair and cam debridement;  Surgeon: Vanetta Mulders, MD;  Location: [place];  Service: Orthopedics;  Laterality: Left;  ? ?Patient Active Problem List  ? Diagnosis Date Noted  ? Pulmonary embolism ([place]) 06/20/2021  ? DVT (deep venous thrombosis) ([place]) 06/20/2021  ? ? ? ?ONSET DATE: 06/05/2021 L HIP ARTHROSCOPY WITH LABRAL REPAIR, L CAM DEBRIDEMENT, L PINCER DEBRIDEMENT ?  ?REFERRING DIAG: M25.552 (ICD-10-CM) - Left hip pain M17.11 (ICD-10-CM) - Patellofemoral arthritis of right knee  ? ?10/17 Surgery date ?  ?THERAPY DIAG:  ?Pain in joint of right knee ?  ?Pain in left hip ?  ?Muscle weakness (generalized) ?  ?Difficulty walking ?  ?SUBJECTIVE:  ?  ?SUBJECTIVE STATEMENT:  ? ?Pt states that the massage is helping a lot with her neck. It still feels tight but it is no longer painful. Pt can no longer take RA meds due to upcoming surgery. She states that everything is painful. She is having increased pain even into her hands.  ? ?Pt states that the L hip feels "almost normal" for the most part except for when  the R foot pain causes the L leg to work more. "It is definitely better but taking more of the brunt from the other leg."  ?  ?PERTINENT HISTORY: RA, L labral tear ?  ?PAIN:  ?Are you having pain? No ?VAS scale: 0/10 ? ?PAIN TYPE: throbbing, aching ?Pain description: constant  ?Aggravating factors: turning neck ?Relieving factors: resting  ?  ? ?THERAPY DIAG:  ?Pain in joint of right knee ? ?Muscle weakness (generalized) ? ?Pain in left hip ? ?Difficulty walking ? ?Cervicalgia ? ?PERTINENT HISTORY: PERTINENT HISTORY: RA, L labral tear  ? ?PRECAUTIONS: None  ? ? ?OBJECTIVE:  ? ?IMPRESSION: ?1. No meniscal or ligamentous injury of the right knee. ?2. Tricompartmental cartilage abnormalities of the right knee most ?severe in the lateral patellofemoral compartment. ? ? ?Previous: ?HHD (measured in lbs) ?  ?L hip flexion 35, ext 26.1, ABD 31.1 , ADD 20.9 , IR 21.0,  ER 18.9 ?  ?R hip flexion 35, ext 35.7, ABD 36.9, ADD 35.5, IR 38.1, ER2  26.2 ?  ?L hip AROM: 115 flexion, IR 20, ER 28, ABD 30 ext 10 ?L Hip PROM WFL ? ?2/22 ?HHD (measured in lbs) ?  ?L hip flexion 43.8, ext 48, ABD 36.4 , ADD 34.4 , IR 31.0,  ER 23.7 ?  ?  L hip AROM: WFL ?L Hip PROM WFL ? ? ?3/9 ? ?ROM ?R knee Flexion 120 deg ?R knee Ext 0 deg ? ?L knee flexion 128 ?L knee ext 10 deg hyper ? ?R knee Ext 56.9 lbs ?R knee flexion 25.7 ? ?L knee flexion 53.7 ?L knee ext 62.7 lb ? ?R knee Special Tests: patellar grind (+), negative Lachman's, negative varus/valgus at 0 ? ?TREATMENT: ? ?3/27 ?Review of HEP, aquatic therapy management, pre-surgical strengthening, knee and hip exercise precautions, performance/technique with quad strengthening ? ? ?Exercises ?- Half Deadlift with Kettlebell  - 2 x daily - 3-4 x weekly - 2 sets - 10 reps ?- Squat with Chair Touch and Resistance Loop  - 1 x daily - 3-4 x weekly - 2 sets - 10 reps ?- Walking Forward Lunge  - 1 x daily - 3-4 x weekly - 3 sets - 10 reps ?- Side Plank on Knees  - 1 x daily - 3-4 x weekly - 1 sets - 10  reps - 1 hold ?- Supine Hip Extension on Bench  - 1 x daily - 3-4 x weekly - 2 sets - 10 reps ?- Standing Hip Flexor Stretch  - 1 x daily - 7 x weekly - 1 sets - 3 reps - 30 hold ?- Sitting Knee Extension with Resistance  - 1 x daily - 3-4 x weekly - 2 sets - 10 reps ?- Supine Active Straight Leg Raise  - 1 x daily - 3-4 x weekly - 3 sets - 10 reps ?- Side Stepping with Resistance at Ankles  - 1 x daily - 3-4 x weekly - 3 sets - 10 reps ?- Single Leg Stance  - 1 x daily - 3-4 x weekly - 2 sets - 3 reps - 30 hold ? ?3/9 ? ?Exercises ?Seated Isometric Knee Extension - 5-6 x daily - 7 x weekly - 1 sets - 10 reps - 5 hold ?Long Sitting Quad Set - 5-6 x daily - 7 x weekly - 1 sets - 20 reps - 2 hold ?Seated Hamstring Curl with Anchored Resistance - 5-6 x daily - 7 x weekly - 3 sets - 10 reps ?Sitting Knee Extension with Resistance - 5-6 x daily - 7 x weekly - 3 sets - 10 reps ? ?3/3 ? ? ?C2-6 UPA grade III prone ?TPDN C/S paraspinals C3-6; skilled palpation and monitoring of soft tissue response throughout session  ?L C/S Paraspinals STM  ? ?C2-6 CPA and UPA bilat grade III-IV ?Manual traction subocc hold grade III 5 min ? ? ?2/22 ? ?C2-6 UPA grade III prone ?TPDN C/S paraspinals C3-6; skilled palpation and monitoring of soft tissue response throughout session  ?L C/S Paraspinals STM  ? ?Butterfly stretch 30s 3x ?Split squat 3x unable to perform due to R toe pain ?Lateral lunge 2x10 L leading no UE ?SLS on foam 30s 4x ?Lateral step up 2x10 ?R knee ext isometrics at 90 5s 10x ? ?1/13 ? ?L hip Mulligan belt grade IV lateral  and inf hip mob in 90 deg, MWM IR and ER grade IV ; S/L extension mob grade III  ? ?Kneeling hip flexor self mob green thick band 3 mins  ?Staggered stance RDL 16lb KB 3x10 ?Staggered stance squat with TRX 4x8 ?SLS with star reach 5x ?Attempted split squat- 5x no longer with pain ? ?  ?PATIENT EDUCATION:  ?Education details: exercise progression, DN DOMS  expectations, recovery times, post-surgical  progression ?Person educated: Patient ?Education method: Explanation,  Demonstration, Tactile cues, Verbal cues, and Handouts ?Education comprehension: verbalized understanding, returned demonstration, verbal cues required, and tactile cues required ?  ?  ?HOME EXERCISE PROGRAM: ?  ?Access Code: [email or web address] ?URL: [email or web address] ?Date: 06/19/2021 ?Prepared by: Daleen Bo ? ?Access Code: [email or web address] ?URL: [email or web address] ?Date: 10/26/2021 ?Prepared by: Daleen Bo ? ? ?  ?  ?ASSESSMENT: ?  ?CLINICAL IMPRESSION: ?Pt's PT to be put on hold given recent increase in pain with weaning off of RA medications for foot surgery. Pt has had increased difficulty with ADL and daily mobility due to generalized/global joint pain. When offered the option of aquatic therapy, pt declined at this time. Pt was provided with updated HEP to include more quad strengthening exercise as well as continuing progression of L hip strength and stability. Plan for pt to return to PT following foot surgery. Pt would benefit from continued skilled therapy in order to reach goals and maximize functional bilat LE  strength and ROM for full return to PLOF.  ?  ?REHAB POTENTIAL: Good ?  ?CLINICAL DECISION MAKING: Stable/uncomplicated ?  ?EVALUATION COMPLEXITY: Low ?  ?  ?GOALS: ?  ?  ?SHORT TERM GOALS: ?  ?STG Name Target Date Goal status  ?1 Pt will become independent with HEP in order to demonstrate synthesis of PT education. ?  11/27/2021 ? MET  ?2 Pt will be able to demonstrate ability to perform full AROM of L LE without assistance LE function for self-care and house hold duties. ?  12/25/2021 ? ongoing  ?3 Pt will be able to demonstrate ability to perform stairs and gait with no AD/UE and equal WB in order to demonstrate functional improvement in L LE function for self-care and house hold duties. ?  ?  07/20/2021 ongoing  ?4 Pt will have an at least 9 pt improvement in LEFS measure in order to demonstrate MCID improvement  in daily function. ?  07/20/2021 MET  ?  ?LONG TERM GOALS:  ?  ?LTG Name Target Date Goal status  ?1 Pt  will become independent with final HEP in order to demonstrate synthesis of PT education. ?  6/19

## 2021-11-27 ENCOUNTER — Other Ambulatory Visit: Payer: Self-pay

## 2021-11-27 ENCOUNTER — Encounter (HOSPITAL_BASED_OUTPATIENT_CLINIC_OR_DEPARTMENT_OTHER): Payer: Self-pay | Admitting: Orthopedic Surgery

## 2021-11-30 ENCOUNTER — Encounter (HOSPITAL_BASED_OUTPATIENT_CLINIC_OR_DEPARTMENT_OTHER): Payer: BC Managed Care – PPO | Admitting: Physical Therapy

## 2021-12-04 ENCOUNTER — Other Ambulatory Visit (HOSPITAL_BASED_OUTPATIENT_CLINIC_OR_DEPARTMENT_OTHER): Payer: BC Managed Care – PPO

## 2021-12-04 ENCOUNTER — Ambulatory Visit (INDEPENDENT_AMBULATORY_CARE_PROVIDER_SITE_OTHER): Payer: BC Managed Care – PPO | Admitting: Orthopaedic Surgery

## 2021-12-04 DIAGNOSIS — S73192A Other sprain of left hip, initial encounter: Secondary | ICD-10-CM

## 2021-12-04 NOTE — Progress Notes (Signed)
? ?                            ? ? ?Post Operative Evaluation ?  ? ?Procedure/Date of Surgery: 06/05/21 -left hip arthroscopy with pincer and cam debridement ? ?Interval History:  ? ?Today for follow-up of her left hip arthroscopy.  She has been off of her DMARD medications as result of upcoming foot surgery as result having pain in the majority of her joints.  She has not been attending physical therapy as a result of this pain.  She is scheduled for first MTP fusion with hammertoe correction with Dr. Victorino Dike in 3 weeks. ? ?PMH/PSH/Family History/Social History/Meds/Allergies:   ? ?Past Medical History:  ?Diagnosis Date  ? Arthritis   ? RA  ? Depression   ? Hallux hammertoe, right   ? History of pulmonary embolism 06/20/2021  ? BL  ? Hypertension   ? ?Past Surgical History:  ?Procedure Laterality Date  ? BREAST MASS EXCISION    ? HIP ARTHROSCOPY Left 06/05/2021  ? Procedure: left hip arthroscopy with labral repair and cam debridement;  Surgeon: [name], MD;  Location: [facility] OR;  Service: Orthopedics;  Laterality: Left;  ? ?Social History  ? ?Socioeconomic History  ? Marital status: Married  ?  Spouse name: Molly Maduro  ? Number of children: Not on file  ? Years of education: Not on file  ? Highest education level: Not on file  ?Occupational History  ? Not on file  ?Tobacco Use  ? Smoking status: Never  ? Smokeless tobacco: Never  ?Vaping Use  ? Vaping Use: Never used  ?Substance and Sexual Activity  ? Alcohol use: Not Currently  ? Drug use: Never  ? Sexual activity: Not on file  ?Other Topics Concern  ? Not on file  ?Social History Narrative  ? Not on file  ? ?Social Determinants of Health  ? ?Financial Resource Strain: Not on file  ?Food Insecurity: Not on file  ?Transportation Needs: Not on file  ?Physical Activity: Not on file  ?Stress: Not on file  ?Social Connections: Not on file  ? ?Family History  ?Problem Relation Age of Onset  ? CAD Father   ? Diabetes Father   ? Clotting disorder Neg Hx   ? ?Allergies   ?Allergen Reactions  ? Moxifloxacin Rash  ? Penicillins Rash  ?  Tolerated Ancef 2g IV on 06/05/2021  ? Shellfish Allergy Rash  ?  Not allergic to iodine  ? Sulfa Antibiotics Rash  ? ?Current Outpatient Medications  ?Medication Sig Dispense Refill  ? apixaban (ELIQUIS) 5 MG TABS tablet Take 5 mg by mouth 2 (two) times daily.    ? Cholecalciferol (VITAMIN D3 PO) Take 7,000 Units by mouth daily.    ? FLUoxetine (PROZAC) 20 MG capsule Take 20 mg by mouth daily.    ? loratadine (CLARITIN) 10 MG tablet Take 10 mg by mouth daily.    ? methocarbamol (ROBAXIN) 750 MG tablet Take 750 mg by mouth 4 (four) times daily.    ? metoprolol succinate (TOPROL-XL) 25 MG 24 hr tablet Take 25 mg by mouth daily.    ? montelukast (SINGULAIR) 10 MG tablet Take 10 mg by mouth daily.    ? norethindrone-ethinyl estradiol (LOESTRIN) 1-20 MG-MCG tablet Take 1 tablet by mouth daily.    ? Tocilizumab (ACTEMRA) 162 MG/0.9ML SOSY Inject into the skin.    ? vitamin B-12 (CYANOCOBALAMIN) 1000 MCG tablet Take 2,000 mcg  by mouth daily.    ? ?No current facility-administered medications for this visit.  ? ?No results found. ? ?Review of Systems:   ?A ROS was performed including pertinent positives and negatives as documented in the HPI. ? ? ?Musculoskeletal Exam:   ? ?Last menstrual period 11/22/2021. ? ?Left hip range of motion 100 degrees flexion, some soreness in flexion and internal rotation about the abductor muscles.  There is moderate gluteus weakness with active abduction laying on the right side with the left side although this is dramatically improved, internal rotation 30 external rotation 45 with minimal pain ? ?Right knee tenderness palpation about the patellofemoral joint.  There is range of motion from 0 to 140 degrees.  There is trace crepitus. ? ?Right foot with dynamic standing pes planus.  Right great toe MTP pain as well as second and third hammertoes ? ? ? ?Imaging:   ? ?none ? ?I personally reviewed and interpreted the  radiographs. ? ? ?Assessment:   ?46 year old female doing overall very well status post left hip arthroscopy.  She is currently scheduled to undergo left first MTP fusion and hammertoe correction with Dr. Victorino Dike.  I would like to see her back for 1 final visit before she leaves for [facility] in July ? ?-Return to clinic in 2 months ? ? ? ?I personally saw and evaluated the patient, and participated in the management and treatment plan. ? ?[name], MD ?Attending Physician, Orthopedic Surgery ? ?This document was dictated using Conservation officer, historic buildings. A reasonable attempt at proof reading has been made to minimize errors. ? ?

## 2021-12-04 NOTE — Progress Notes (Signed)

## 2021-12-07 ENCOUNTER — Other Ambulatory Visit: Payer: Self-pay

## 2021-12-07 ENCOUNTER — Encounter (HOSPITAL_BASED_OUTPATIENT_CLINIC_OR_DEPARTMENT_OTHER): Payer: Self-pay | Admitting: Orthopedic Surgery

## 2021-12-07 ENCOUNTER — Encounter (HOSPITAL_BASED_OUTPATIENT_CLINIC_OR_DEPARTMENT_OTHER)
Admission: RE | Disposition: A | Discharge status: Home / Self Care | Payer: Self-pay | Source: Home / Self Care | Attending: Orthopedic Surgery

## 2021-12-07 ENCOUNTER — Ambulatory Visit (HOSPITAL_BASED_OUTPATIENT_CLINIC_OR_DEPARTMENT_OTHER): Payer: BC Managed Care – PPO | Admitting: Anesthesiology

## 2021-12-07 ENCOUNTER — Ambulatory Visit (HOSPITAL_BASED_OUTPATIENT_CLINIC_OR_DEPARTMENT_OTHER)
Admission: RE | Admit: 2021-12-07 | Discharge: 2021-12-07 | Disposition: A | Discharge status: Home / Self Care | Payer: BC Managed Care – PPO | Attending: Orthopedic Surgery | Admitting: Orthopedic Surgery

## 2021-12-07 ENCOUNTER — Ambulatory Visit (HOSPITAL_BASED_OUTPATIENT_CLINIC_OR_DEPARTMENT_OTHER): Payer: BC Managed Care – PPO

## 2021-12-07 DIAGNOSIS — M2021 Hallux rigidus, right foot: Secondary | ICD-10-CM | POA: Diagnosis present

## 2021-12-07 DIAGNOSIS — F32A Depression, unspecified: Secondary | ICD-10-CM | POA: Insufficient documentation

## 2021-12-07 DIAGNOSIS — M25774 Osteophyte, right foot: Secondary | ICD-10-CM | POA: Insufficient documentation

## 2021-12-07 DIAGNOSIS — Z7901 Long term (current) use of anticoagulants: Secondary | ICD-10-CM | POA: Diagnosis not present

## 2021-12-07 DIAGNOSIS — I10 Essential (primary) hypertension: Secondary | ICD-10-CM | POA: Diagnosis not present

## 2021-12-07 DIAGNOSIS — M2041 Other hammer toe(s) (acquired), right foot: Secondary | ICD-10-CM | POA: Diagnosis not present

## 2021-12-07 DIAGNOSIS — D759 Disease of blood and blood-forming organs, unspecified: Secondary | ICD-10-CM | POA: Insufficient documentation

## 2021-12-07 HISTORY — DX: Hallux varus (acquired), right foot: M20.31

## 2021-12-07 HISTORY — PX: HAMMERTOE RECONSTRUCTION WITH WEIL OSTEOTOMY: SHX5631

## 2021-12-07 HISTORY — PX: ARTHRODESIS METATARSALPHALANGEAL JOINT (MTPJ): SHX6566

## 2021-12-07 HISTORY — DX: Essential (primary) hypertension: I10

## 2021-12-07 LAB — POCT PREGNANCY, URINE: Preg Test, Ur: NEGATIVE

## 2021-12-07 SURGERY — FUSION, JOINT, GREAT TOE
Anesthesia: General | Site: Toe | Laterality: Right

## 2021-12-07 MED ORDER — VANCOMYCIN HCL 500 MG IV SOLR
INTRAVENOUS | Status: DC | PRN
  Administered 2021-12-07: 500 mg

## 2021-12-07 MED ORDER — ACETAMINOPHEN 500 MG PO TABS
ORAL_TABLET | ORAL | Status: AC
  Filled 2021-12-07: qty 2

## 2021-12-07 MED ORDER — SODIUM CHLORIDE 0.9 % IV SOLN
INTRAVENOUS | Status: DC
Start: 2021-12-07 — End: 2021-12-07

## 2021-12-07 MED ORDER — ONDANSETRON HCL 4 MG/2ML IJ SOLN
INTRAMUSCULAR | Status: AC
  Filled 2021-12-07: qty 2

## 2021-12-07 MED ORDER — ACETAMINOPHEN 500 MG PO TABS
1000.0000 mg | ORAL_TABLET | Freq: Once | ORAL | Status: AC
Start: 2021-12-07 — End: 2021-12-07
  Administered 2021-12-07: 1000 mg via ORAL

## 2021-12-07 MED ORDER — ONDANSETRON HCL 4 MG/2ML IJ SOLN
4.0000 mg | Freq: Once | INTRAMUSCULAR | Status: AC | PRN
  Administered 2021-12-07: 4 mg via INTRAVENOUS

## 2021-12-07 MED ORDER — BUPIVACAINE-EPINEPHRINE (PF) 0.5% -1:200000 IJ SOLN
INTRAMUSCULAR | Status: DC | PRN
  Administered 2021-12-07: 10 mL via PERINEURAL
  Administered 2021-12-07: 30 mL via PERINEURAL

## 2021-12-07 MED ORDER — PROPOFOL 500 MG/50ML IV EMUL
INTRAVENOUS | Status: DC | PRN
  Administered 2021-12-07: 25 ug/kg/min via INTRAVENOUS

## 2021-12-07 MED ORDER — 0.9 % SODIUM CHLORIDE (POUR BTL) OPTIME
TOPICAL | Status: DC | PRN
  Administered 2021-12-07 (×2): 100 mL

## 2021-12-07 MED ORDER — MIDAZOLAM HCL 2 MG/2ML IJ SOLN
INTRAMUSCULAR | Status: AC
Start: 2021-12-07 — End: ?
  Filled 2021-12-07: qty 2

## 2021-12-07 MED ORDER — OXYCODONE HCL 5 MG PO TABS: 5.0000 mg | ORAL_TABLET | ORAL | 0 refills | Status: AC | PRN

## 2021-12-07 MED ORDER — ONDANSETRON HCL 4 MG/2ML IJ SOLN
INTRAMUSCULAR | Status: DC | PRN
  Administered 2021-12-07: 4 mg via INTRAVENOUS

## 2021-12-07 MED ORDER — FENTANYL CITRATE (PF) 100 MCG/2ML IJ SOLN
INTRAMUSCULAR | Status: AC
Start: 2021-12-07 — End: ?
  Filled 2021-12-07: qty 2

## 2021-12-07 MED ORDER — MIDAZOLAM HCL 2 MG/2ML IJ SOLN
2.0000 mg | Freq: Once | INTRAMUSCULAR | Status: AC
  Administered 2021-12-07: 2 mg via INTRAVENOUS

## 2021-12-07 MED ORDER — CEFAZOLIN SODIUM-DEXTROSE 2-4 GM/100ML-% IV SOLN
2.0000 g | INTRAVENOUS | Status: DC
  Administered 2021-12-07: 2 g via INTRAVENOUS

## 2021-12-07 MED ORDER — CEFAZOLIN IN SODIUM CHLORIDE 3-0.9 GM/100ML-% IV SOLN
INTRAVENOUS | Status: AC
  Filled 2021-12-07: qty 100

## 2021-12-07 MED ORDER — FENTANYL CITRATE (PF) 100 MCG/2ML IJ SOLN
INTRAMUSCULAR | Status: AC
  Filled 2021-12-07: qty 2

## 2021-12-07 MED ORDER — SENNA 8.6 MG PO TABS: 2.0000 | ORAL_TABLET | Freq: Two times a day (BID) | ORAL | 0 refills | Status: AC

## 2021-12-07 MED ORDER — LACTATED RINGERS IV SOLN: INTRAVENOUS | Status: DC

## 2021-12-07 MED ORDER — FENTANYL CITRATE (PF) 100 MCG/2ML IJ SOLN
100.0000 ug | Freq: Once | INTRAMUSCULAR | Status: AC
  Administered 2021-12-07: 100 ug via INTRAVENOUS

## 2021-12-07 MED ORDER — DEXAMETHASONE SODIUM PHOSPHATE 10 MG/ML IJ SOLN
INTRAMUSCULAR | Status: DC | PRN
Start: 2021-12-07 — End: 2021-12-07
  Administered 2021-12-07: 4 mg via INTRAVENOUS

## 2021-12-07 MED ORDER — LIDOCAINE 2% (20 MG/ML) 5 ML SYRINGE
INTRAMUSCULAR | Status: DC | PRN
Start: 2021-12-07 — End: 2021-12-07
  Administered 2021-12-07: 30 mg via INTRAVENOUS

## 2021-12-07 MED ORDER — PROPOFOL 10 MG/ML IV BOLUS
INTRAVENOUS | Status: DC | PRN
  Administered 2021-12-07: 200 mg via INTRAVENOUS

## 2021-12-07 MED ORDER — CEFAZOLIN IN SODIUM CHLORIDE 3-0.9 GM/100ML-% IV SOLN: 3.0000 g | INTRAVENOUS | Status: DC

## 2021-12-07 MED ORDER — FENTANYL CITRATE (PF) 100 MCG/2ML IJ SOLN: 25.0000 ug | INTRAMUSCULAR | Status: DC | PRN

## 2021-12-07 MED ORDER — DOCUSATE SODIUM 100 MG PO CAPS: 100.0000 mg | ORAL_CAPSULE | Freq: Two times a day (BID) | ORAL | 0 refills | Status: AC

## 2021-12-07 MED ORDER — MIDAZOLAM HCL 2 MG/2ML IJ SOLN
INTRAMUSCULAR | Status: AC
  Filled 2021-12-07: qty 2

## 2021-12-07 SURGICAL SUPPLY — 103 items
APL PRP STRL LF DISP 70% ISPRP (MISCELLANEOUS) ×2
BANDAGE ESMARK 6X9 LF (GAUZE/BANDAGES/DRESSINGS) IMPLANT
BIT DRILL 2.7XCANN QCK CNCT (BIT) IMPLANT
BIT DRILL CANN 2.4 (BIT) ×3
BIT DRILL CANN 2.7 (BIT) ×3
BIT DRILL CANN MAX VPC 2.4 (BIT) IMPLANT
BIT DRILL Q-C 2.0 DIA 100 (BIT) ×1 IMPLANT
BIT DRL 2.7XCANN QCK CNCT (BIT) ×2
BLADE AVERAGE 25X9 (BLADE) IMPLANT
BLADE LONG MED 25X9 (BLADE) ×3 IMPLANT
BLADE MICRO SAGITTAL (BLADE) IMPLANT
BLADE OSC/SAG .038X5.5 CUT EDG (BLADE) IMPLANT
BLADE SURG 15 STRL LF DISP TIS (BLADE) ×4 IMPLANT
BLADE SURG 15 STRL SS (BLADE) ×9
BNDG CMPR 9X4 STRL LF SNTH (GAUZE/BANDAGES/DRESSINGS)
BNDG CMPR 9X6 STRL LF SNTH (GAUZE/BANDAGES/DRESSINGS)
BNDG COHESIVE 4X5 TAN ST LF (GAUZE/BANDAGES/DRESSINGS) ×3 IMPLANT
BNDG COHESIVE 6X5 TAN ST LF (GAUZE/BANDAGES/DRESSINGS) IMPLANT
BNDG CONFORM 2 STRL LF (GAUZE/BANDAGES/DRESSINGS) IMPLANT
BNDG CONFORM 3 STRL LF (GAUZE/BANDAGES/DRESSINGS) ×3 IMPLANT
BNDG ELASTIC 4X5.8 VLCR STR LF (GAUZE/BANDAGES/DRESSINGS) ×3 IMPLANT
BNDG ESMARK 4X9 LF (GAUZE/BANDAGES/DRESSINGS) IMPLANT
BNDG ESMARK 6X9 LF (GAUZE/BANDAGES/DRESSINGS)
BOOT STEPPER DURA LG (SOFTGOODS) ×1 IMPLANT
BOOT STEPPER DURA MED (SOFTGOODS) IMPLANT
BOOT STEPPER DURA SM (SOFTGOODS) IMPLANT
BOOT STEPPER DURA XLG (SOFTGOODS) IMPLANT
CAP PIN PROTECTOR ORTHO WHT (CAP) IMPLANT
CHLORAPREP W/TINT 26 (MISCELLANEOUS) ×3 IMPLANT
COVER BACK TABLE 60X90IN (DRAPES) ×3 IMPLANT
CUFF TOURN SGL QUICK 34 (TOURNIQUET CUFF)
CUFF TRNQT CYL 34X4.125X (TOURNIQUET CUFF) IMPLANT
DRAPE EXTREMITY T 121X128X90 (DISPOSABLE) ×3 IMPLANT
DRAPE OEC MINIVIEW 54X84 (DRAPES) ×3 IMPLANT
DRAPE U-SHAPE 47X51 STRL (DRAPES) ×3 IMPLANT
DRIVER BIT CANN HEX 2.0 (BIT) ×1 IMPLANT
DRSG MEPITEL 4X7.2 (GAUZE/BANDAGES/DRESSINGS) ×3 IMPLANT
DRSG PAD ABDOMINAL 8X10 ST (GAUZE/BANDAGES/DRESSINGS) ×3 IMPLANT
ELECT REM PT RETURN 9FT ADLT (ELECTROSURGICAL) ×3
ELECTRODE REM PT RTRN 9FT ADLT (ELECTROSURGICAL) ×2 IMPLANT
GAUZE SPONGE 4X4 12PLY STRL (GAUZE/BANDAGES/DRESSINGS) ×3 IMPLANT
GLOVE BIO SURGEON STRL SZ8 (GLOVE) ×3 IMPLANT
GLOVE BIOGEL PI IND STRL 7.0 (GLOVE) IMPLANT
GLOVE BIOGEL PI IND STRL 8 (GLOVE) ×4 IMPLANT
GLOVE BIOGEL PI INDICATOR 7.0 (GLOVE) ×2
GLOVE BIOGEL PI INDICATOR 8 (GLOVE) ×2
GLOVE ECLIPSE 8.0 STRL XLNG CF (GLOVE) ×3 IMPLANT
GLOVE SURG SS PI 7.0 STRL IVOR (GLOVE) ×1 IMPLANT
GOWN STRL REUS W/ TWL LRG LVL3 (GOWN DISPOSABLE) ×2 IMPLANT
GOWN STRL REUS W/ TWL XL LVL3 (GOWN DISPOSABLE) ×4 IMPLANT
GOWN STRL REUS W/TWL LRG LVL3 (GOWN DISPOSABLE) ×3
GOWN STRL REUS W/TWL XL LVL3 (GOWN DISPOSABLE) ×6
K-WIRE ACE 1.6X6 (WIRE) ×6
K-WIRE COCR 1.1X105 (WIRE) ×3
K-WIRE DBL .054X4 NSTRL (WIRE)
KWIRE ACE 1.6X6 (WIRE) IMPLANT
KWIRE COCR 1.1X105 (WIRE) IMPLANT
KWIRE DBL .054X4 NSTRL (WIRE) IMPLANT
NEEDLE HYPO 22GX1.5 SAFETY (NEEDLE) IMPLANT
NS IRRIG 1000ML POUR BTL (IV SOLUTION) ×3 IMPLANT
PACK BASIN DAY SURGERY FS (CUSTOM PROCEDURE TRAY) ×3 IMPLANT
PAD CAST 4YDX4 CTTN HI CHSV (CAST SUPPLIES) ×2 IMPLANT
PADDING CAST ABS 4INX4YD NS (CAST SUPPLIES)
PADDING CAST ABS COTTON 4X4 ST (CAST SUPPLIES) IMPLANT
PADDING CAST COTTON 4X4 STRL (CAST SUPPLIES) ×3
PADDING CAST COTTON 6X4 STRL (CAST SUPPLIES) IMPLANT
PASSER SUT SWANSON 36MM LOOP (INSTRUMENTS) IMPLANT
PENCIL SMOKE EVACUATOR (MISCELLANEOUS) ×3 IMPLANT
PLATE TUB 39 W/COLLAR 5H (Plate) ×1 IMPLANT
SANITIZER HAND PURELL 535ML FO (MISCELLANEOUS) ×3 IMPLANT
SCREW 2.7X16MM (Screw) ×3 IMPLANT
SCREW CANN 1/3 THRD RVRS CT (Screw) IMPLANT
SCREW CANNULATED 4.0X40 (Screw) ×3 IMPLANT
SCREW CORT 2.5X20X2.7XST SM (Screw) IMPLANT
SCREW CORTICAL 2.7X14MM (Screw) ×1 IMPLANT
SCREW CORTICAL 2.7X18MM (Screw) ×1 IMPLANT
SCREW CORTICAL 2.7X20MM (Screw) ×3 IMPLANT
SCREW CORTICAL LOCK 2.7X22 (Screw) ×1 IMPLANT
SCREW HCS TWIST-OFF 2.0X12MM (Screw) ×1 IMPLANT
SCREW NLOCK CORT 2.7X16 NS (Screw) IMPLANT
SCREW VPC 3.4X16 (Screw) ×1 IMPLANT
SHEET MEDIUM DRAPE 40X70 STRL (DRAPES) ×3 IMPLANT
SLEEVE SCD COMPRESS KNEE MED (STOCKING) ×3 IMPLANT
SPLINT FAST PLASTER 5X30 (CAST SUPPLIES)
SPLINT PLASTER CAST FAST 5X30 (CAST SUPPLIES) IMPLANT
SPONGE SURGIFOAM ABS GEL 12-7 (HEMOSTASIS) IMPLANT
SPONGE T-LAP 18X18 ~~LOC~~+RFID (SPONGE) ×3 IMPLANT
STOCKINETTE 6  STRL (DRAPES) ×1
STOCKINETTE 6 STRL (DRAPES) ×2 IMPLANT
SUCTION FRAZIER HANDLE 10FR (MISCELLANEOUS) ×1
SUCTION TUBE FRAZIER 10FR DISP (MISCELLANEOUS) ×2 IMPLANT
SUT ETHILON 3 0 PS 1 (SUTURE) ×3 IMPLANT
SUT MNCRL AB 3-0 PS2 18 (SUTURE) ×3 IMPLANT
SUT VIC AB 2-0 SH 27 (SUTURE) ×3
SUT VIC AB 2-0 SH 27XBRD (SUTURE) ×2 IMPLANT
SUT VICRYL 0 SH 27 (SUTURE) IMPLANT
SUT VICRYL 0 UR6 27IN ABS (SUTURE) IMPLANT
SYR BULB EAR ULCER 3OZ GRN STR (SYRINGE) ×3 IMPLANT
SYR CONTROL 10ML LL (SYRINGE) IMPLANT
TOWEL GREEN STERILE FF (TOWEL DISPOSABLE) ×6 IMPLANT
TUBE CONNECTING 20X1/4 (TUBING) ×3 IMPLANT
UNDERPAD 30X36 HEAVY ABSORB (UNDERPADS AND DIAPERS) ×3 IMPLANT
YANKAUER SUCT BULB TIP NO VENT (SUCTIONS) IMPLANT

## 2021-12-07 NOTE — Anesthesia Postprocedure Evaluation (Signed)
Anesthesia Post Note ? ?Patient: Linda Gill ? ?Procedure(s) Performed: Right hallux metatarsophalangeal joint arthrodesis (Right: Toe) ?2nd Weil and hammertoe correction (Right) ? ?  ? ?Patient location during evaluation: PACU ?Anesthesia Type: General ?Level of consciousness: awake and alert ?Pain management: pain level controlled ?Vital Signs Assessment: post-procedure vital signs reviewed and stable ?Respiratory status: spontaneous breathing, nonlabored ventilation, respiratory function stable and patient connected to nasal cannula oxygen ?Cardiovascular status: blood pressure returned to baseline and stable ?Postop Assessment: no apparent nausea or vomiting ?Anesthetic complications: no ? ? ?No notable events documented. ? ?Last Vitals:  ?Vitals:  ? 12/07/21 1300 12/07/21 1337  ?BP: (!) 106/48 123/80  ?Pulse: 72 73  ?Resp: 16 18  ?Temp:  (!) 36.1 ?C  ?SpO2: 99% 97%  ?  ?Last Pain:  ?Vitals:  ? 12/07/21 1337  ?TempSrc: Oral  ?PainSc: 0-No pain  ? ? ?  ?  ?  ?  ?  ?  ? ?Collene Schlichter ? ? ? ? ?

## 2021-12-07 NOTE — Anesthesia Procedure Notes (Signed)
Anesthesia Regional Block: Adductor canal block  ? ?Pre-Anesthetic Checklist: , timeout performed,  Correct Patient, Correct Site, Correct Laterality,  Correct Procedure, Correct Position, site marked,  Risks and benefits discussed,  Surgical consent,  Pre-op evaluation,  At surgeon's request and post-op pain management ? ?Laterality: Right ? ?Prep: chloraprep     ?  ?Needles:  ?Injection technique: Single-shot ? ?Needle Type: Echogenic Needle   ? ? ?Needle Length: 9cm  ?Needle Gauge: 21  ? ? ? ?Additional Needles: ? ? ?Procedures:,,,, ultrasound used (permanent image in chart),,    ?Narrative:  ?Start time: 12/07/2021 9:07 AM ?End time: 12/07/2021 9:11 AM ?Injection made incrementally with aspirations every 5 mL. ? ?Performed by: Personally  ?Anesthesiologist: Santa Lighter, MD ? ?Additional Notes: ?No pain on injection. No increased resistance to injection. Injection made in 5cc increments.  Good needle visualization.  Patient tolerated procedure well. ? ? ? ? ?

## 2021-12-07 NOTE — Anesthesia Procedure Notes (Signed)
Procedure Name: LMA Insertion ?Date/Time: 12/07/2021 11:22 AM ?Performed by: Sheryn Bison, CRNA ?Pre-anesthesia Checklist: Patient identified, Emergency Drugs available, Suction available and Patient being monitored ?Patient Re-evaluated:Patient Re-evaluated prior to induction ?Oxygen Delivery Method: Circle System Utilized ?Preoxygenation: Pre-oxygenation with 100% oxygen ?Induction Type: IV induction ?Ventilation: Mask ventilation without difficulty ?LMA: LMA inserted ?LMA Size: 4.0 ?Number of attempts: 1 ?Airway Equipment and Method: bite block ?Placement Confirmation: positive ETCO2 ?Tube secured with: Tape ?Dental Injury: Teeth and Oropharynx as per pre-operative assessment  ? ? ? ? ?

## 2021-12-07 NOTE — Transfer of Care (Signed)
Immediate Anesthesia Transfer of Care Note ? ?Patient: Linda Gill ? ?Procedure(s) Performed: Right hallux metatarsophalangeal joint arthrodesis (Right: Toe) ?2nd Weil and hammertoe correction (Right) ? ?Patient Location: PACU ? ?Anesthesia Type:General and Regional ? ?Level of Consciousness: drowsy ? ?Airway & Oxygen Therapy: Patient Spontanous Breathing and Patient connected to face mask oxygen ? ?Post-op Assessment: Report given to RN and Post -op Vital signs reviewed and stable ? ?Post vital signs: Reviewed and stable ? ?Last Vitals:  ?Vitals Value Taken Time  ?BP 107/68 12/07/21 1232  ?Temp    ?Pulse 70 12/07/21 1234  ?Resp 15 12/07/21 1234  ?SpO2 97 % 12/07/21 1234  ?Vitals shown include unvalidated device data. ? ?Last Pain:  ?Vitals:  ? 12/07/21 0838  ?TempSrc: Oral  ?PainSc: 0-No pain  ?   ? ?Patients Stated Pain Goal: 4 (12/07/21 [id number]) ? ?Complications: No notable events documented. ?

## 2021-12-07 NOTE — Anesthesia Procedure Notes (Signed)
Anesthesia Regional Block: Popliteal block  ? ?Pre-Anesthetic Checklist: , timeout performed,  Correct Patient, Correct Site, Correct Laterality,  Correct Procedure, Correct Position, site marked,  Risks and benefits discussed,  Surgical consent,  Pre-op evaluation,  At surgeon's request and post-op pain management ? ?Laterality: Right ? ?Prep: chloraprep     ?  ?Needles:  ?Injection technique: Single-shot ? ?Needle Type: Echogenic Needle   ? ? ?Needle Length: 9cm  ?Needle Gauge: 21  ? ? ? ?Additional Needles: ? ? ?Procedures:,,,, ultrasound used (permanent image in chart),,    ?Narrative:  ?Start time: 12/07/2021 9:01 AM ?End time: 12/07/2021 9:07 AM ?Injection made incrementally with aspirations every 5 mL. ? ?Performed by: Personally  ?Anesthesiologist: Collene Schlichterurk, Yamil Dougher E, MD ? ?Additional Notes: ?No pain on injection. No increased resistance to injection. Injection made in 5cc increments.  Good needle visualization.  Patient tolerated procedure well. ? ? ? ? ?

## 2021-12-07 NOTE — H&P (Signed)
Linda Gill is an 46 y.o. female.   ?Chief Complaint: Right foot pain ?HPI: 46 year old female without significant past medical history complains of right foot pain worsening over the last several years.  She has hallux rigidus on the right as well has a fixed hammertoe deformity of the second toe.  She has flexible hammertoes of the third through fifth toes.  She has failed nonoperative treatment including activity modification, oral anti-inflammatories and shoewear modification.  She presents today for surgical treatment of these right painful forefoot deformities. ? ?Past Medical History:  ?Diagnosis Date  ? Arthritis   ? RA  ? Depression   ? Hallux hammertoe, right   ? History of pulmonary embolism 06/20/2021  ? BL  ? Hypertension   ? ? ?Past Surgical History:  ?Procedure Laterality Date  ? BREAST MASS EXCISION    ? HIP ARTHROSCOPY Left 06/05/2021  ? Procedure: left hip arthroscopy with labral repair and cam debridement;  Surgeon: [name], MD;  Location: [facility] OR;  Service: Orthopedics;  Laterality: Left;  ? ? ?Family History  ?Problem Relation Age of Onset  ? CAD Father   ? Diabetes Father   ? Clotting disorder Neg Hx   ? ?Social History:  reports that she has never smoked. She has never used smokeless tobacco. She reports that she does not currently use alcohol. She reports that she does not use drugs. ? ?Allergies:  ?Allergies  ?Allergen Reactions  ? Moxifloxacin Rash  ? Penicillins Rash  ?  Tolerated Ancef 2g IV on 06/05/2021  ? Shellfish Allergy Rash  ?  Not allergic to iodine  ? Sulfa Antibiotics Rash  ? ? ?Medications Prior to Admission  ?Medication Sig Dispense Refill  ? apixaban (ELIQUIS) 5 MG TABS tablet Take 5 mg by mouth 2 (two) times daily.    ? Cholecalciferol (VITAMIN D3 PO) Take 7,000 Units by mouth daily.    ? FLUoxetine (PROZAC) 20 MG capsule Take 20 mg by mouth daily.    ? loratadine (CLARITIN) 10 MG tablet Take 10 mg by mouth daily.    ? metoprolol succinate (TOPROL-XL) 25 MG 24 hr  tablet Take 25 mg by mouth daily.    ? montelukast (SINGULAIR) 10 MG tablet Take 10 mg by mouth daily.    ? Tocilizumab (ACTEMRA) 162 MG/0.9ML SOSY Inject into the skin.    ? vitamin B-12 (CYANOCOBALAMIN) 1000 MCG tablet Take 2,000 mcg by mouth daily.    ? methocarbamol (ROBAXIN) 750 MG tablet Take 750 mg by mouth 4 (four) times daily.    ? ? ?Results for orders placed or performed during the hospital encounter of 12/07/21 (from the past 48 hour(s))  ?Pregnancy, urine POC     Status: None  ? Collection Time: 12/07/21  8:18 AM  ?Result Value Ref Range  ? Preg Test, Ur NEGATIVE NEGATIVE  ?  Comment:        ?THE SENSITIVITY OF THIS ?METHODOLOGY IS >24 mIU/mL ?  ? ?No results found. ? ?Review of Systems no recent fever, chills, nausea, vomiting or changes in her appetite ? ?Blood pressure 135/83, pulse 76, temperature 98.2 ?F (36.8 ?C), temperature source Oral, resp. rate 12, height  (1.88 m), weight 120.7 kg, last menstrual period 11/22/2021, SpO2 95 %. ?Physical Exam  ?Well-nourished well-developed woman in no apparent distress.  Alert and oriented x4.  Normal mood and affect.  Extraocular motions are intact.  Respirations are unlabored.  Gait is normal.  The right foot has decreased range  of motion of the hallux MP joint.  Fixed hammertoe deformity of the second toe.  Tight flexor tendons of the third through fifth toes.  Skin is healthy and intact.  Pulses are palpable.  No lymphadenopathy. ? ?Assessment/Plan ?Right hallux rigidus and second through fifth hammertoe deformities -to the operating room today for hallux MP joint arthrodesis, second metatarsal Weil and hammertoe correction, third through fifth flexor tenotomy's.  The risks and benefits of the alternative treatment options have been discussed in detail.  The patient wishes to proceed with surgery and specifically understands risks of bleeding, infection, nerve damage, blood clots, need for additional surgery, amputation and death.  ? ?Toni Arthurs,  MD ?12-09-2021, 10:57 AM ? ? ? ?

## 2021-12-07 NOTE — Anesthesia Preprocedure Evaluation (Signed)
Anesthesia Evaluation  ?Patient identified by MRN, date of birth, ID band ?Patient awake ? ? ? ?Reviewed: ?Allergy & Precautions, NPO status , Patient's Chart, lab work & pertinent test results, reviewed documented beta blocker date and time  ? ?Airway ?Mallampati: II ? ?TM Distance: >3 FB ?Neck ROM: Full ? ? ? Dental ? ?(+) Teeth Intact, Dental Advisory Given ?  ?Pulmonary ? ?H/o PE ?  ?Pulmonary exam normal ?breath sounds clear to auscultation ? ? ? ? ? ? Cardiovascular ?hypertension, Pt. on home beta blockers ?Normal cardiovascular exam ?Rhythm:Regular Rate:Normal ? ? ?  ?Neuro/Psych ?PSYCHIATRIC DISORDERS Depression negative neurological ROS ?   ? GI/Hepatic ?negative GI ROS, Neg liver ROS,   ?Endo/Other  ?Obesity ? ? Renal/GU ?negative Renal ROS  ? ?  ?Musculoskeletal ? ?(+) Arthritis , Right foot hallux rigidus, hammertoe  ? Abdominal ?  ?Peds ? Hematology ? ?(+) Blood dyscrasia (Eliquis), ,   ?Anesthesia Other Findings ?Day of surgery medications reviewed with the patient. ? Reproductive/Obstetrics ? ?  ? ? ? ? ? ? ? ? ? ? ? ? ? ?  ?  ? ? ? ? ? ? ? ? ?Anesthesia Physical ?Anesthesia Plan ? ?ASA: 2 ? ?Anesthesia Plan: General  ? ?Post-op Pain Management: Regional block* and Tylenol PO (pre-op)*  ? ?Induction: Intravenous ? ?PONV Risk Score and Plan: 3 and Midazolam, Dexamethasone and Ondansetron ? ?Airway Management Planned: LMA ? ?Additional Equipment:  ? ?Intra-op Plan:  ? ?Post-operative Plan: Extubation in OR ? ?Informed Consent: I have reviewed the patients History and Physical, chart, labs and discussed the procedure including the risks, benefits and alternatives for the proposed anesthesia with the patient or authorized representative who has indicated his/her understanding and acceptance.  ? ? ? ?Dental advisory given ? ?Plan Discussed with: CRNA ? ?Anesthesia Plan Comments:   ? ? ? ? ? ? ?Anesthesia Quick Evaluation ? ?

## 2021-12-07 NOTE — Discharge Instructions (Addendum)
Linda Simmer, MD ?[facility] ? ?Please read the following information regarding your care after surgery. ? ?Medications  ?You only need a prescription for the narcotic pain medicine (ex. oxycodone, Percocet, Norco).  All of the other medicines listed below are available over the counter. ?? Aleve 2 pills twice a day for the first 3 days after surgery. ?? acetominophen (Tylenol) 650 mg every 4-6 hours as you need for minor to moderate pain *You had 1000 mg of tylenol at 8:50 am ?? oxycodone as prescribed for severe pain ? ?Narcotic pain medicine (ex. oxycodone, Percocet, Vicodin) will cause constipation.  To prevent this problem, take the following medicines while you are taking any pain medicine. ?? docusate sodium (Colace) 100 mg twice a day ? senna (Senokot) 2 tablets twice a day ? ? ?Weight Bearing ?? Bear weight only on your operated foot in the CAM boot. ? ? ?Cast / Splint / Dressing ?? Keep your splint, cast or dressing clean and dry.  Don?t put anything (coat hanger, pencil, etc) down inside of it.  If it gets damp, use a hair dryer on the cool setting to dry it.  If it gets soaked, call the office to schedule an appointment for a cast change. ? ? ?After your dressing, cast or splint is removed; you may shower, but do not soak or scrub the wound.  Allow the water to run over it, and then gently pat it dry. ? ?Swelling ?It is normal for you to have swelling where you had surgery.  To reduce swelling and pain, keep your toes above your nose for at least 3 days after surgery.  It may be necessary to keep your foot or leg elevated for several weeks.  If it hurts, it should be elevated. ? ?Follow Up ?Call my office at [phone] when you are discharged from the hospital or surgery center to schedule an appointment to be seen two weeks after surgery. ? ?Call my office at [phone] if you develop a fever >101.5? F, nausea, vomiting, bleeding from the surgical site or severe pain.   ? ?  ?Regional Anesthesia  Blocks ? ?1. Numbness or the inability to move the "blocked" extremity may last from 3-48 hours after placement. The length of time depends on the medication injected and your individual response to the medication. If the numbness is not going away after 48 hours, call your surgeon. ? ?2. The extremity that is blocked will need to be protected until the numbness is gone and the  Strength has returned. Because you cannot feel it, you will need to take extra care to avoid injury. Because it may be weak, you may have difficulty moving it or using it. You may not know what position it is in without looking at it while the block is in effect. ? ?3. For blocks in the legs and feet, returning to weight bearing and walking needs to be done carefully. You will need to wait until the numbness is entirely gone and the strength has returned. You should be able to move your leg and foot normally before you try and bear weight or walk. You will need someone to be with you when you first try to ensure you do not fall and possibly risk injury. ? ?4. Bruising and tenderness at the needle site are common side effects and will resolve in a few days. ? ?5. Persistent numbness or new problems with movement should be communicated to the surgeon or the [facility] (  [id number] [place] [phone]).  ? ? ?Post Anesthesia Home Care Instructions ? ?Activity: ?Get plenty of rest for the remainder of the day. A responsible individual must stay with you for 24 hours following the procedure.  ?For the next 24 hours, DO NOT: ?-Drive a car ?-Paediatric nurse ?-Drink alcoholic beverages ?-Take any medication unless instructed by your physician ?-Make any legal decisions or sign important papers. ? ?Meals: ?Start with liquid foods such as gelatin or soup. Progress to regular foods as tolerated. Avoid greasy, spicy, heavy foods. If nausea and/or vomiting occur, drink only clear liquids until the nausea and/or  vomiting subsides. Call your physician if vomiting continues. ? ?Special Instructions/Symptoms: ?Your throat may feel dry or sore from the anesthesia or the breathing tube placed in your throat during surgery. If this causes discomfort, gargle with warm salt water. The discomfort should disappear within 24 hours. ? ?If you had a scopolamine patch placed behind your ear for the management of post- operative nausea and/or vomiting: ? ?1. The medication in the patch is effective for 72 hours, after which it should be removed.  Wrap patch in a tissue and discard in the trash. Wash hands thoroughly with soap and water. ?2. You may remove the patch earlier than 72 hours if you experience unpleasant side effects which may include dry mouth, dizziness or visual disturbances. ?3. Avoid touching the patch. Wash your hands with soap and water after contact with the patch. ?    ?

## 2021-12-07 NOTE — Op Note (Signed)
12/07/2021 ? ?12:38 PM ? ?PATIENT:  Linda Gill  46 y.o. female ? ?PRE-OPERATIVE DIAGNOSIS: 1.  Right hallux rigidus ?2.  Right second rigid hammertoe deformity ?3.  Right third through fifth flexible hammertoe deformities ? ?POST-OPERATIVE DIAGNOSIS: Same ? ?Procedure(s): ?1.  Right hallux MP joint arthrodesis ?2.  Right second metatarsal Weil osteotomy ?3.  Right second hammertoe correction ?4.  Right second, third, fourth and fifth toe percutaneous FDL tenotomies ?5.  Right foot AP and lateral radiographs ? ?SURGEON:  Toni Arthurs, MD ? ?ASSISTANT: Alfredo Martinez, PA-C ? ?ANESTHESIA:   General, regional ? ?EBL:  minimal  ? ?TOURNIQUET:   ?Total Tourniquet Time Documented: ?Thigh (Right) - 50 minutes ?Total: Thigh (Right) - 50 minutes ? ?COMPLICATIONS:  None apparent ? ?DISPOSITION:  Extubated, awake and stable to recovery. ? ?INDICATION FOR PROCEDURE: 46 year old female without significant past medical history complains of a long history of right forefoot pain.  She has hallux rigidus as well as second through fifth hammertoe deformities.  She has failed nonoperative treatment to date and presents today for surgical correction of these painful forefoot deformities.  The risks and benefits of the alternative treatment options have been discussed in detail.  The patient wishes to proceed with surgery and specifically understands risks of bleeding, infection, nerve damage, blood clots, need for additional surgery, amputation and death.  ? ? ?PROCEDURE IN DETAIL:  After pre operative consent was obtained, and the correct operative site was identified, the patient was brought to the operating room and placed supine on the OR table.  Anesthesia was administered.  Pre-operative antibiotics were administered.  A surgical timeout was taken.  The right lower extremity was prepped and draped in standard sterile fashion with a tourniquet around the thigh.  The extremity was elevated and the tourniquet was inflated to 250  mmHg.  Percutaneous flexor tenotomy's were then performed at the second, third, fourth and fifth toes with the tip of a Beaver blade through the distal flexion crease.  The toes were then noted to be passively correctable to neutral with the exception of a PIP joint contracture at the second toe. ? ?Attention was turned to the hallux where a longitudinal incision was made over the MP joint.  Dissection was carried down through the subcutaneous tissues.  The dorsal joint capsule was incised and elevated medially and laterally.  The collateral ligaments were released allowing exposure of the metatarsal head.  Osteophytes were removed with a rondure.  A concave reamer was used to remove the remaining articular cartilage and subchondral bone from the head of the metatarsal.  A convex reamer was used to remove the remaining articular cartilage and subchondral bone from the base of the middle phalanx.  The joint was irrigated.  A small drill bit was used to perforate both sides of the joint leaving the resultant bone graft in place.  The joint was reduced and provisionally pinned.  Radiographs and weightbearing exam showed appropriate alignment of the toe.  The joint was then compressed with a 4 mm stainless steel partially-threaded cannulated screw from the [employer].  A 5 hole one quarter tubular plate from the small frag set was then contoured to fit the dorsum of the joint.  It was secured proximally and distally with bicortical screws.  Radiographs confirmed appropriate reduction of the joint and appropriate position and length of all hardware.  The wound was irrigated copiously and sprinkled with vancomycin powder.  Subcutaneous tissues were approximated with Vicryl.  Skin  incisions were closed with nylon. ? ?Attention was turned to the second MTP joint.  A longitudinal incision was made and dissection carried sharply down through the subcutaneous tissues.  The extensor tendons were lengthened and the dorsal  joint capsule excised.  The head of the metatarsal was exposed.  A Weil osteotomy was made with the oscillating saw.  The head of the metatarsal was allowed to retract proximally several millimeters.  The osteotomy was fixed with a 2 mm FRS screw from [employer].  Overhanging bone was trimmed with a rondure.   ? ?A transverse incision was then made over the PIP joint.  The head of the proximal phalanx was resected with the oscillating saw followed by the base of the middle phalanx.  The joint was reduced and fixed with a 3.5 mm [employer] VPC screw. ? ?Final AP and lateral radiographs confirmed appropriate position and length of all hardware and appropriate reduction of the forefoot form of these.  The wounds were irrigated and sprinkled with vancomycin powder.  Skin incisions were closed with nylon.  Sterile dressings were applied followed by a compression wrap and a cam boot.  The tourniquet was released after application of the dressings.  The patient was awakened from anesthesia and transported to the recovery room in stable condition. ? ? ?FOLLOW UP PLAN: Weightbearing as tolerated in a cam boot.  Follow-up in the office in 2 weeks for suture removal and initiation of taping of the second toe down.  No indication for DVT prophylaxis in this ambulatory patient. ? ?RADIOGRAPHS: AP and lateral radiographs of the right foot are obtained intraoperatively.  These show interval correction of the second hammertoe deformity and arthrodesis of the hallux MP joint.  Hardware is appropriately positioned and of the appropriate lengths.  No other acute injuries are noted. ? ? ? Alfredo Martinez PA-C was present and scrubbed for the duration of the operative case. His assistance was essential in positioning the patient, prepping and draping, gaining and maintaining exposure, performing the operation, closing and dressing the wounds and applying the splint. ? ?  ?

## 2021-12-07 NOTE — Progress Notes (Signed)
Assisted Dr. Turk with right, adductor canal, popliteal, ultrasound guided block. Side rails up, monitors on throughout procedure. See vital signs in flow sheet. Tolerated Procedure well. 

## 2021-12-11 ENCOUNTER — Encounter (HOSPITAL_BASED_OUTPATIENT_CLINIC_OR_DEPARTMENT_OTHER): Payer: Self-pay | Admitting: Orthopedic Surgery

## 2021-12-13 ENCOUNTER — Encounter (HOSPITAL_BASED_OUTPATIENT_CLINIC_OR_DEPARTMENT_OTHER): Payer: BC Managed Care – PPO | Admitting: Physical Therapy

## 2022-02-16 ENCOUNTER — Ambulatory Visit (HOSPITAL_BASED_OUTPATIENT_CLINIC_OR_DEPARTMENT_OTHER): Payer: BC Managed Care – PPO | Admitting: Orthopaedic Surgery

## 2022-02-21 ENCOUNTER — Ambulatory Visit (HOSPITAL_BASED_OUTPATIENT_CLINIC_OR_DEPARTMENT_OTHER): Payer: BC Managed Care – PPO | Admitting: Orthopaedic Surgery

## 2022-09-27 ENCOUNTER — Other Ambulatory Visit (HOSPITAL_COMMUNITY): Payer: Self-pay
